# Patient Record
Sex: Female | Born: 1975 | Race: White | Hispanic: No | Marital: Married | Smoking: Current every day smoker
Health system: Southern US, Community
[De-identification: ages and names within clinical notes are randomized; demographics above are authoritative.]

## PROBLEM LIST (undated history)

## (undated) DIAGNOSIS — T8859XA Other complications of anesthesia, initial encounter: Secondary | ICD-10-CM

## (undated) DIAGNOSIS — M797 Fibromyalgia: Secondary | ICD-10-CM

## (undated) DIAGNOSIS — E785 Hyperlipidemia, unspecified: Secondary | ICD-10-CM

## (undated) DIAGNOSIS — I10 Essential (primary) hypertension: Secondary | ICD-10-CM

## (undated) DIAGNOSIS — I350 Nonrheumatic aortic (valve) stenosis: Secondary | ICD-10-CM

## (undated) DIAGNOSIS — F419 Anxiety disorder, unspecified: Secondary | ICD-10-CM

## (undated) HISTORY — DX: Hyperlipidemia, unspecified: E78.5

## (undated) HISTORY — DX: Nonrheumatic aortic (valve) stenosis: I35.0

## (undated) HISTORY — PX: ABDOMINAL HYSTERECTOMY: SHX81

## (undated) HISTORY — DX: Essential (primary) hypertension: I10

## (undated) HISTORY — PX: CHOLECYSTECTOMY: SHX55

---

## 2000-08-05 ENCOUNTER — Emergency Department (HOSPITAL_COMMUNITY): Admission: EM | Admit: 2000-08-05 | Discharge: 2000-08-06 | Payer: Self-pay | Admitting: Emergency Medicine

## 2000-08-08 ENCOUNTER — Emergency Department (HOSPITAL_COMMUNITY): Admission: EM | Admit: 2000-08-08 | Discharge: 2000-08-08 | Payer: Self-pay | Admitting: Emergency Medicine

## 2000-11-05 ENCOUNTER — Other Ambulatory Visit: Admission: RE | Admit: 2000-11-05 | Discharge: 2000-11-05 | Payer: Self-pay | Admitting: Specialist

## 2001-02-23 ENCOUNTER — Other Ambulatory Visit: Admission: RE | Admit: 2001-02-23 | Discharge: 2001-02-23 | Payer: Self-pay | Admitting: Obstetrics and Gynecology

## 2001-06-23 ENCOUNTER — Inpatient Hospital Stay (HOSPITAL_COMMUNITY): Admission: AD | Admit: 2001-06-23 | Discharge: 2001-06-25 | Payer: Self-pay | Admitting: *Deleted

## 2001-10-20 ENCOUNTER — Encounter: Payer: Self-pay | Admitting: Obstetrics & Gynecology

## 2001-10-20 ENCOUNTER — Ambulatory Visit (HOSPITAL_COMMUNITY): Admission: RE | Admit: 2001-10-20 | Discharge: 2001-10-20 | Payer: Self-pay | Admitting: Obstetrics & Gynecology

## 2001-10-23 ENCOUNTER — Ambulatory Visit (HOSPITAL_COMMUNITY): Admission: RE | Admit: 2001-10-23 | Discharge: 2001-10-23 | Payer: Self-pay | Admitting: General Surgery

## 2001-10-23 ENCOUNTER — Encounter: Payer: Self-pay | Admitting: General Surgery

## 2001-12-27 ENCOUNTER — Emergency Department (HOSPITAL_COMMUNITY): Admission: EM | Admit: 2001-12-27 | Discharge: 2001-12-27 | Payer: Self-pay | Admitting: Emergency Medicine

## 2002-11-09 ENCOUNTER — Inpatient Hospital Stay (HOSPITAL_COMMUNITY): Admission: RE | Admit: 2002-11-09 | Discharge: 2002-11-10 | Payer: Self-pay | Admitting: Obstetrics & Gynecology

## 2003-04-10 ENCOUNTER — Emergency Department (HOSPITAL_COMMUNITY): Admission: EM | Admit: 2003-04-10 | Discharge: 2003-04-10 | Payer: Self-pay | Admitting: Emergency Medicine

## 2005-09-06 ENCOUNTER — Ambulatory Visit (HOSPITAL_COMMUNITY): Admission: RE | Admit: 2005-09-06 | Discharge: 2005-09-06 | Payer: Self-pay | Admitting: Internal Medicine

## 2006-01-29 ENCOUNTER — Emergency Department (HOSPITAL_COMMUNITY): Admission: EM | Admit: 2006-01-29 | Discharge: 2006-01-29 | Payer: Self-pay | Admitting: Emergency Medicine

## 2006-05-11 ENCOUNTER — Emergency Department (HOSPITAL_COMMUNITY): Admission: EM | Admit: 2006-05-11 | Discharge: 2006-05-11 | Payer: Self-pay | Admitting: Emergency Medicine

## 2006-12-16 ENCOUNTER — Emergency Department (HOSPITAL_COMMUNITY): Admission: EM | Admit: 2006-12-16 | Discharge: 2006-12-16 | Payer: Self-pay | Admitting: Emergency Medicine

## 2006-12-27 ENCOUNTER — Emergency Department (HOSPITAL_COMMUNITY): Admission: EM | Admit: 2006-12-27 | Discharge: 2006-12-27 | Payer: Self-pay | Admitting: Emergency Medicine

## 2007-02-25 ENCOUNTER — Emergency Department (HOSPITAL_COMMUNITY): Admission: EM | Admit: 2007-02-25 | Discharge: 2007-02-26 | Payer: Self-pay | Admitting: Emergency Medicine

## 2007-08-26 ENCOUNTER — Emergency Department (HOSPITAL_COMMUNITY): Admission: EM | Admit: 2007-08-26 | Discharge: 2007-08-26 | Payer: Self-pay | Admitting: Emergency Medicine

## 2008-02-03 ENCOUNTER — Emergency Department (HOSPITAL_COMMUNITY): Admission: EM | Admit: 2008-02-03 | Discharge: 2008-02-03 | Payer: Self-pay | Admitting: Emergency Medicine

## 2008-04-29 ENCOUNTER — Emergency Department (HOSPITAL_COMMUNITY): Admission: EM | Admit: 2008-04-29 | Discharge: 2008-04-29 | Payer: Self-pay | Admitting: Emergency Medicine

## 2008-09-21 ENCOUNTER — Emergency Department (HOSPITAL_COMMUNITY): Admission: EM | Admit: 2008-09-21 | Discharge: 2008-09-21 | Payer: Self-pay | Admitting: Emergency Medicine

## 2009-11-03 ENCOUNTER — Emergency Department (HOSPITAL_COMMUNITY): Admission: EM | Admit: 2009-11-03 | Discharge: 2009-11-03 | Payer: Self-pay | Admitting: Emergency Medicine

## 2009-12-30 ENCOUNTER — Emergency Department (HOSPITAL_COMMUNITY): Admission: EM | Admit: 2009-12-30 | Discharge: 2009-12-30 | Payer: Self-pay | Admitting: Emergency Medicine

## 2010-01-25 ENCOUNTER — Emergency Department (HOSPITAL_COMMUNITY): Admission: EM | Admit: 2010-01-25 | Discharge: 2009-06-16 | Payer: Self-pay | Admitting: Emergency Medicine

## 2010-03-11 ENCOUNTER — Encounter: Payer: Self-pay | Admitting: Orthopaedic Surgery

## 2010-03-11 ENCOUNTER — Encounter: Payer: Self-pay | Admitting: Urology

## 2010-05-08 LAB — POCT I-STAT, CHEM 8
BUN: 7 mg/dL (ref 6–23)
Chloride: 108 mEq/L (ref 96–112)
Creatinine, Ser: 0.4 mg/dL (ref 0.4–1.2)
Glucose, Bld: 109 mg/dL — ABNORMAL HIGH (ref 70–99)
Hemoglobin: 13.3 g/dL (ref 12.0–15.0)
Potassium: 3.7 mEq/L (ref 3.5–5.1)
Sodium: 141 mEq/L (ref 135–145)
TCO2: 24 mmol/L (ref 0–100)

## 2010-05-08 LAB — POCT CARDIAC MARKERS
CKMB, poc: 2.6 ng/mL (ref 1.0–8.0)
Myoglobin, poc: 75.1 ng/mL (ref 12–200)
Troponin i, poc: <0.05 ng/mL (ref 0.00–0.09)

## 2010-07-06 NOTE — H&P (Signed)
NAME:  Kara Matthews, Kara Matthews                       ACCOUNT NO.:  [id number]   MEDICAL RECORD NO.:  [id number]                   PATIENT TYPE:  AMB   LOCATION:  DAY                                  FACILITY:  [facility]   PHYSICIAN:  Lazaro Arms, M.D.                DATE OF BIRTH:  10-06-1975   DATE OF ADMISSION:  11/09/2002  DATE OF DISCHARGE:                                HISTORY & PHYSICAL   The patient is a 35 year old white female, gravida 2, para 2, status post  tubal ligation with her C-section in 2003. The patient has had continued  left-sided pelvic pain for quite some time.  She has had kidney stones in  the past, and it felt like that recently.  However, her evaluation was  negative for renal calculi.  She also has painful, heavy menses.  She  sometimes soils her clothes and sheets and it requires Tylox for her  periods.  Also, she has pain with intercourse each time she has intercourse.  Sonogram reveals a normal uterus, no cyst.  No free fluid in the cul-de-sac.  We offered oral contraceptive pills, but the patient declined that the  Lorcet was not helping.  As a result with her chronic pelvic pain, left  lower quadrant pain, dysmenorrhea, dyspareunia, and irregular bleeding she  is admitted for abdominal hysterectomy, BSO.  She requests to have both  ovaries removed.   PAST MEDICAL HISTORY:  Negative.   PAST SURGICAL HISTORY:  1. She had a C-section in 1991 and 2003.  2. She had a tubal ligation with the C-section in 1993.  3. She also had a laparoscopic cholecystectomy, shortly after delivery and     that was done in September 2003.   MEDICATIONS:  Her current medications are Lorcet for pain.   ALLERGIES:  She is allergic to CODEINE and ANCEF.   REVIEW OF SYSTEMS:  The patient smokes a pack of cigarettes a day.  She  denies other problems.   PHYSICAL EXAMINATION:  VITAL SIGNS:  Her weight is 168 pounds.  Blood  pressure is 120/80.  HEENT:  Unremarkable.  NECK:   Thyroid is normal.  LUNGS:  Lungs are clear.  HEART:  Heart is regular rate and rhythm without regurgitation or gallop.  BREASTS:  Without masses, discharge, or skin changes.  ABDOMEN:  Benign.  No hepatosplenomegaly or masses.  Well healed scars.  PELVIC: Uterus is normal size, shape, contour.  Tender to palpation.  The  left adnexa is tender, the right adnexa is benign.   IMPRESSION:  1. Chronic pelvic pain.  2. Left lower quadrant pain.  3. Dyspareunia.  4. Menometrorrhagia.  5. Dysmenorrhea.   PLAN:  The patient is admitted for TH/BSO.  She understands the risks,  benefits, indications and alternatives -- will proceed.     ___________________________________________  Lazaro Arms, M.D.   Loraine Maple  D:  11/08/2002  T:  11/08/2002  Job:  [id number]

## 2010-07-06 NOTE — Discharge Summary (Signed)
   NAMEGALADRIEL, SHROFF                       ACCOUNT NO.:  [id number]   MEDICAL RECORD NO.:  [id number]                   PATIENT TYPE:  INP   LOCATION:  A426                                 FACILITY:  [facility]   PHYSICIAN:  Lazaro Arms, M.D.                DATE OF BIRTH:  03-10-1975   DATE OF ADMISSION:  11/09/2002  DATE OF DISCHARGE:  11/10/2002                                 DISCHARGE SUMMARY   DISCHARGE DIAGNOSES:  1. Status post total abdominal hysterectomy, bilateral salpingo-     oophorectomy.  2. Unremarkable postoperative course.   PROCEDURE:  Total abdominal hysterectomy, bilateral salpingo-oophorectomy.   Please refer to the transcribed history and physical and OP note for details  of admission to the hospital.   HOSPITAL COURSE:  The patient was admitted postoperatively.  Her  intraoperative course was unremarkable.  She had a Foley on the afternoon of  surgery.  She voided.  She was ambulatory, tolerated clear liquids and a  regular diet without difficulty and tolerated oral pain medicine.  She was  begging to go home on the morning of postoperative day #1; so she was  discharged to home on Tylox, Motrin, and Levaquin. She has a Climara patch  0.1 mg placed and she has an appointment in the office next Monday.  She was  given instructions and precautions for her term prior to that time.     ___________________________________________                                         Lazaro Arms, M.D.   Loraine Maple  D:  11/10/2002  T:  11/10/2002  Job:  [id number]

## 2010-07-06 NOTE — H&P (Signed)
[facility]  Patient:    Kara Matthews, Kara Matthews Visit Number: [id number] MRN: [id number]          Service Type: OBS Location: 4A A428 01 Attending Physician:  Rosalyn Charters Dictated by:   Chilton Si, C.N.M. Admit Date:  06/23/2001   CC:         [facility]   History and Physical  CONTINUATION  PAST MEDICAL HISTORY:  Noncontributory.  PAST SURGICAL HISTORY:  Positive for cesarean section.  PHYSICAL EXAMINATION:  VITAL SIGNS:  Temperature 99.7 degrees, blood pressure 103/65, pulse 90, respirations 20.  HEENT:  Within normal limits.  HEART:  Regular rate and rhythm without murmurs.  LUNGS:  Clear to auscultation bilaterally.  ABDOMEN:  Soft, nontender.  No uterine activity.  Positive right CVA tenderness.  PELVIC:  Cervix is long, thick, firm and closed with a high presenting part.  EXTREMITIES:  Legs are negative.  LABORATORY DATA:  WBC 12.6, hemoglobin 10.5, hematocrit 30.0.  Urinalysis has specific gravity greater than 1.030, moderate hemoglobin, 2+ protein, positive nitrites, trace leukocyte esterase; too numerous to count wbc/hpf, 11-020 rbc/hpf.  IMPRESSION:  1. Intrauterine pregnancy at 27 weeks.  2. Pyelonephritis.  3. Anemia.  PLAN:  1. Admit for IV antibiotics and pain control.  2. Begin iron therapy. Dictated by:   Chilton Si, C.N.M. Attending Physician:  Rosalyn Charters DD:  06/24/01 TD:  06/24/01 Job: [id number]

## 2010-07-06 NOTE — Op Note (Signed)
Kara Matthews, Kara Matthews                       ACCOUNT NO.:  [id number]   MEDICAL RECORD NO.:  [id number]                   PATIENT TYPE:  AMB   LOCATION:  DAY                                  FACILITY:  [facility]   PHYSICIAN:  Lazaro Arms, M.D.                DATE OF BIRTH:  1975-12-19   DATE OF PROCEDURE:  11/09/2002  DATE OF DISCHARGE:                                 OPERATIVE REPORT   PREOPERATIVE DIAGNOSES:  1. Menometrorrhagia.  2. Chronic pelvic pain.  3. Dysmenorrhea.  4. Dyspareunia.   POSTOPERATIVE DIAGNOSES:  1. Menometrorrhagia.  2. Chronic pelvic pain.  3. Dysmenorrhea.  4. Dyspareunia.   PROCEDURE:  TAHBSO.   SURGEON:  Lazaro Arms, M.D.   ANESTHESIA:  General endotracheal.   FINDINGS:  The patient had normal intraperitoneal cavity.  There was really  no adhesive disease.  The uterus, tubes, and ovaries appeared to be normal,  and there was no evidence of endometriosis.   DESCRIPTION OF PROCEDURE:  The patient was taken to the operating room and  placed in the supine position where she underwent general endotracheal  anesthesia.  A Foley catheter was placed.  The vagina was prepped.  The  abdomen was prepped and draped in the usual sterile fashion.   A Pfannenstiel skin incision was made and carried down sharply through the  rectus fascia which was scored in the midline and extended laterally.  The  fascia was taken off of the muscles superiorly and inferiorly without  difficulty.  The muscles were divided, and the peritoneal cavity was  entered.  A protractor self-retaining retractor was placed, and the upper  abdomen was packed away.  The uterus was grasped from the cornu, and the  infundibulopelvic ligament was isolated and a vascular window made.  It was  double clamped, cut, and double suture ligated on the left.  Large Hemoclips  were also placed for hemostasis.  The same procedure was carried out on the  right.  The infundibulopelvic ligament was  isolated and a vascular window  identified and a window made.  It was double clamped, cut, and double suture  ligated with good hemostasis.  The vesicouterine serosal flap was created,  and the bladder was pushed down.  The uterine vessels were isolated.  They  were clamped, cut, and suture ligated bilaterally.  Serial pedicles were  taken down the cervix through the cardinal ligament, each pedicle being  clamped, cut, and suture ligated.  The vaginal angle was encountered.  The  vagina was crossclamped.  The specimen was removed.  The vaginal angle  sutures were placed in figure-of-eight fashion, and the vagina was closed  with figure-of-eight sutures.  There was good hemostasis of all pedicles.  The pelvis was irrigated vigorously.  Once again, all pedicles were found to  be hemostatic.  The protractor was removed.  The lap packs were removed.  All counts  were correct at this point.  The muscles were reapproximated  loosely.  The fascia was closed using 0 Vicryl running.  The subcutaneous  tissue was made hemostatic and irrigated.  The skin was closed using skin  staples.   The patient tolerated the procedure well.  She experienced 350 cc of blood  loss and was taken to the recovery room in good and stable condition.  All  counts were correct x3.  All specimens were sent to the lab.  She received  Cleocin prophylactically.      ___________________________________________                                            Lazaro Arms, M.D.   Kara Matthews  D:  11/09/2002  T:  11/09/2002  Job:  [id number]

## 2010-07-06 NOTE — H&P (Signed)
   NAME:  Kara Matthews, Kara Matthews                       ACCOUNT NO.:  [id number]   MEDICAL RECORD NO.:  [id number]                   PATIENT TYPE:  OUT   LOCATION:  RAD                                  FACILITY:  [facility]   PHYSICIAN:  Dalia Heading, M.D.               DATE OF BIRTH:  Mar 11, 1975   DATE OF ADMISSION:  10/20/2001  DATE OF DISCHARGE:  10/20/2001                                HISTORY & PHYSICAL   CHIEF COMPLAINT:  Cholecystitis, cholelithiasis.   HISTORY OF PRESENT ILLNESS:  The patient is a 35 year old, white female who  was referred for evaluation and treatment of cholecystitis secondary to  cholelithiasis.  She has been having right upper quadrant abdominal pain,  indigestion, vomiting and nausea intermittently for several weeks and seems  to be getting worse.  She has had no fever, chills or jaundice that have  been noted.   PAST MEDICAL HISTORY:  C-section in July 2003, for an uncomplicated  pregnancy.   PAST SURGICAL HISTORY:  1. C-sections in 1991 and 2003.  2. Tubal ligation.   CURRENT MEDICATIONS:  Percocet.   ALLERGIES:  CODEINE.   REVIEW OF SYMPTOMS:  The patient does smoke a pack of cigarettes per day.  She denies any significant alcohol use.  She denies any bleeding disorder.   PHYSICAL EXAMINATION:  GENERAL:  The patient is a well-developed, well-  nourished, white female in no acute distress.  VITAL SIGNS:  Afebrile with vital signs stable.  HEENT:  No scleral icterus.  LUNGS:  Clear to auscultation with equal breath sounds bilaterally.  HEART:  Regular rate and rhythm without S3, S4 and murmurs.  ABDOMEN:  Soft and nondistended.  She is tender in the right upper quadrant  to palpation.  No hepatosplenomegaly, masses or hernias identified.  Ultrasound of the gallbladder reveals cholelithiasis, cholecystitis and a  common bile duct of 5 mm.   IMPRESSION:  Acute cholecystitis, cholelithiasis.    PLAN:  The patient is scheduled for laparoscopic  cholecystectomy with  cholangiograms on October 23, 2001.  The risks and benefits of the  procedure including bleeding, infection, hepatobiliary injury and possible  open procedure were fully explained to the patient.  We gained informed  consent.                                               Dalia Heading, M.D.    MAJ/MEDQ  D:  10/22/2001  T:  10/22/2001  Job:  [id number]   cc:   Lazaro Arms, M.D.   Angus G. Renard Matter, M.D.

## 2010-07-06 NOTE — Op Note (Signed)
NAME:  Kara Matthews, Kara Matthews                       ACCOUNT NO.:  [id number]   MEDICAL RECORD NO.:  [id number]                   PATIENT TYPE:  AMB   LOCATION:  DAY                                  FACILITY:  [facility]   PHYSICIAN:  Dalia Heading, M.D.               DATE OF BIRTH:  05-28-1975   DATE OF PROCEDURE:  10/23/2001  DATE OF DISCHARGE:                                 OPERATIVE REPORT   PREOPERATIVE DIAGNOSIS:  Cholecystitis, cholelithiasis.   POSTOPERATIVE DIAGNOSIS:  Cholecystitis, cholelithiasis.   PROCEDURE:  Laparoscopic cholecystectomy with cholangiograms.   SURGEON:  Dalia Heading, M.D.   ANESTHESIA:  General endotracheal   INDICATIONS:  The patient is a 35 year old white female who is referred for  evaluation and treatment of cholecystitis secondary to cholelithiasis.  The  risks and benefits of the procedure including bleeding, infection,  hepatobiliary injury, and the possibility of an open procedure were fully  explained to the patient, who gave informed consent.   DESCRIPTION OF PROCEDURE:  The patient was placed in the supine position.  After induction of general endotracheal anesthesia, the abdomen was prepped  and draped using the usual sterile technique with Betadine.   An supraumbilical incision was made down to the fascia.  A Veress needle was  introduced into the abdominal cavity and confirmation of placement was done  using the saline drop test.  The abdomen was then insufflated to 16 mmHg  pressure.  An 11-mm trocar was introduced into the abdominal cavity under  direct visualization without difficulty.  The patient was placed in reverse  Trendelenburg position and an additional 11-mm trocar was placed in the  epigastric region and 5-mm trocars were placed in the right upper quadrant  and right flank regions. The liver was inspected and noted to be within  normal limits.   The gallbladder was retracted superiorly and laterally.  The dissection was  begun around the infundibulum of the gallbladder.  The cystic duct was first  identified.  Its junction to the infundibulum fully identified.  A single  Endoclip was placed proximally on the cystic duct.  An incision was made  into the cystic duct and a cholangiocatheter was inserted.  Under digital  fluoroscopy, cholangiogram were performed.  The dye flowed freely into the  duodenum.  No intrahepatic filling defects were noted.  There was no  evidence of choledocholithiasis.  The common bile duct appeared to be pretty  much within normal limits.  The cholangiocatheter was removed and multiple  Endoclips were placed distally on the cystic and the cystic duce was  divided.  The cystic artery was likewise divided and ligated.  The  gallbladder was freed away from the gallbladder fossa using Bovie  electrocautery.  The gallbladder was delivered through the epigastric trocar  site using an EndoCatch bag.  The gallbladder fossa was inspected and no  abnormal bleeding or bile leakage was noted.  Surgicel was placed in the gallbladder fossa, the subhepatic space, as well  as the right hepatic gutter were evacuated of all fluid and air prior to  removal of the trocars.  All wounds were irrigated with normal saline.  All  wounds were injected with 0.5% Sensorcaine.  The supraumbilical fascia was  reapproximated using an #0 Vicryl interrupted suture. All skin incisions  were closed using staples. Betadine ointment and dry sterile dressings were  applied.  All tape and needle counts correct at the end of the procedure.  The patient was extubated in the operating room and went back to recovery  room in awake and stable condition.   COMPLICATIONS:  None.   SPECIMENS:  Gallbladder with stones.   BLOOD LOSS:  Minimal.                                               Dalia Heading, M.D.    MAJ/MEDQ  D:  10/23/2001  T:  10/23/2001  Job:  [id number]   cc:   Lazaro Arms, M.D.   Angus G. Renard Matter,  M.D.

## 2010-07-06 NOTE — Discharge Summary (Signed)
[facility]  Patient:    Kara Matthews, Kara Matthews Visit Number: [id number] MRN: [id number]          Service Type: OBS Location: 4A A428 01 Attending Physician:  Lazaro Arms Dictated by:   Christin Bach, M.D. Admit Date:  06/23/2001 Discharge Date: 06/25/2001                             Discharge Summary  ADMITTING DIAGNOSES: 1. Intrauterine pregnancy at 27 weeks. 2. Pyelonephritis. 3. Anemia of pregnancy.  DISCHARGE DIAGNOSES: 1. Pregnancy at 27 weeks not delivered. 2. Pyelonephritis versus musculoskeletal back pain. 3. Anemia of pregnancy.  DISCHARGE MEDICATIONS: 1. Keflex 500 mg p.o. q.i.d. x7 days. 2. Prenatal vitamins one p.o. q.d. 3. Iron sulfate 325 mg one p.o. b.i.d. x30 days and p.r.n. thereafter.  LABORATORY DATA:  Urine culture 3000 colonies per cc, considered insignificant growth.  HISTORY OF PRESENT ILLNESS:  This 35 year old female at [redacted] weeks gestation, gravida 2, para 1, due September 22, 2001 was admitted after presenting with chief complaints of back pain, lower abdominal pain without ruptured membranes, with evaluation showing the patient to have a low-grade temperature of 99.7, pulse 97, respirations 20, blood pressure 116/73, with a urinalysis showing large amount of blood, 2+ protein, and 1+ leukocytes in a clean-voided specimen. Cervical exam showed the cervix to be closed, long and thick. The patient was admitted for presumptive kidney infection. See admitting history which is pertinent for right CVA tenderness and long, closed, thick cervix with no uterine irritability on monitoring.  HOSPITAL COURSE:  The patient was admitted and received IV Rocephin 1 g q.12 h. with evaluation showing that the CVA tenderness had resolved by the morning of Jun 25, 2001. She was having no preterm labor. The urine culture was pending at the time of discharge, but final report was as previously mentioned minimal culture of 3000 colonies not  otherwise specified.  DISPOSITION:  She was discharged home on Keflex as a precaution.  FOLLOWUP:  Followup will be in one week our office, then as needed. Dictated by:   Christin Bach, M.D. Attending Physician:  Lazaro Arms DD:  07/26/01 TD:  07/28/01 Job: [id number]

## 2010-07-06 NOTE — H&P (Signed)
[facility]  Patient:    Kara Matthews, Kara Matthews Visit Number: [id number] MRN: [id number]          Service Type: OBS Location: 4A A428 01 Attending Physician:  Rosalyn Charters Dictated by:   Chilton Si, C.N.M. Admit Date:  06/23/2001                           History and Physical  INCOMPLETE  CHIEF COMPLAINT:  Lower back pain and abdominal pain for two days.  HISTORY OF PRESENT ILLNESS:  Kara Matthews is a 35 year old white female, who is [redacted] weeks pregnant, planning a repeat cesarean section, with complaints of lower back pain and abdominal cramps for several days.  She denies dysuria but complains of frequency.  PAST MEDICAL HISTORY: Dictated by:   Chilton Si, C.N.M. Attending Physician:  Rosalyn Charters DD:  06/24/01 TD:  06/24/01 Job: [id number]

## 2010-11-15 LAB — DIFFERENTIAL
Basophils Absolute: 0
Eosinophils Absolute: 0.2
Lymphocytes Relative: 17
Lymphs Abs: 1.8
Monocytes Absolute: 0.4
Monocytes Relative: 4

## 2010-11-15 LAB — URINE CULTURE

## 2010-11-15 LAB — URINALYSIS, ROUTINE W REFLEX MICROSCOPIC: Specific Gravity, Urine: 1.02

## 2010-11-15 LAB — BASIC METABOLIC PANEL
BUN: 6
CO2: 27
Calcium: 9.3
Chloride: 106
GFR calc Af Amer: >60
Glucose, Bld: 136 — ABNORMAL HIGH
Potassium: 3.4 — ABNORMAL LOW
Sodium: 140

## 2010-11-15 LAB — CBC
HCT: 37.3
MCV: 89.7
RBC: 4.16

## 2010-11-15 LAB — URINE MICROSCOPIC-ADD ON

## 2010-11-27 LAB — RAPID STREP SCREEN (MED CTR MEBANE ONLY): Streptococcus, Group A Screen (Direct): POSITIVE — AB

## 2011-04-04 ENCOUNTER — Other Ambulatory Visit: Payer: Self-pay | Admitting: Obstetrics & Gynecology

## 2011-10-24 ENCOUNTER — Emergency Department (HOSPITAL_COMMUNITY): Payer: Medicaid Other

## 2011-10-24 ENCOUNTER — Encounter (HOSPITAL_COMMUNITY): Payer: Self-pay | Admitting: *Deleted

## 2011-10-24 ENCOUNTER — Emergency Department (HOSPITAL_COMMUNITY)
Admission: EM | Admit: 2011-10-24 | Discharge: 2011-10-24 | Disposition: A | Discharge status: Home / Self Care | Payer: Medicaid Other | Attending: Emergency Medicine | Admitting: Emergency Medicine

## 2011-10-24 DIAGNOSIS — IMO0001 Reserved for inherently not codable concepts without codable children: Secondary | ICD-10-CM | POA: Insufficient documentation

## 2011-10-24 DIAGNOSIS — S838X9A Sprain of other specified parts of unspecified knee, initial encounter: Secondary | ICD-10-CM | POA: Insufficient documentation

## 2011-10-24 DIAGNOSIS — Y9289 Other specified places as the place of occurrence of the external cause: Secondary | ICD-10-CM | POA: Insufficient documentation

## 2011-10-24 DIAGNOSIS — F17200 Nicotine dependence, unspecified, uncomplicated: Secondary | ICD-10-CM | POA: Insufficient documentation

## 2011-10-24 DIAGNOSIS — F411 Generalized anxiety disorder: Secondary | ICD-10-CM | POA: Insufficient documentation

## 2011-10-24 DIAGNOSIS — S86919A Strain of unspecified muscle(s) and tendon(s) at lower leg level, unspecified leg, initial encounter: Secondary | ICD-10-CM

## 2011-10-24 DIAGNOSIS — Z9089 Acquired absence of other organs: Secondary | ICD-10-CM | POA: Insufficient documentation

## 2011-10-24 DIAGNOSIS — Z79899 Other long term (current) drug therapy: Secondary | ICD-10-CM | POA: Insufficient documentation

## 2011-10-24 DIAGNOSIS — X500XXA Overexertion from strenuous movement or load, initial encounter: Secondary | ICD-10-CM | POA: Insufficient documentation

## 2011-10-24 DIAGNOSIS — I10 Essential (primary) hypertension: Secondary | ICD-10-CM | POA: Insufficient documentation

## 2011-10-24 HISTORY — DX: Anxiety disorder, unspecified: F41.9

## 2011-10-24 HISTORY — DX: Fibromyalgia: M79.7

## 2011-10-24 MED ORDER — IBUPROFEN 600 MG PO TABS: 600.0000 mg | ORAL_TABLET | Freq: Four times a day (QID) | ORAL | Status: AC | PRN

## 2011-10-24 MED ORDER — HYDROCODONE-ACETAMINOPHEN 5-325 MG PO TABS: 1.0000 | ORAL_TABLET | ORAL | Status: AC | PRN

## 2011-10-24 MED ORDER — IBUPROFEN 800 MG PO TABS
800.0000 mg | ORAL_TABLET | Freq: Once | ORAL | Status: AC
  Administered 2011-10-24: 800 mg via ORAL
  Filled 2011-10-24: qty 1

## 2011-10-24 MED ORDER — HYDROCODONE-ACETAMINOPHEN 5-325 MG PO TABS: 1.0000 | ORAL_TABLET | Freq: Once | ORAL | Status: DC

## 2011-10-24 NOTE — ED Notes (Signed)
Left knee pain since yesterday. Denies injury.

## 2011-10-25 NOTE — ED Provider Notes (Signed)
History     CSN: [id number]  Arrival date & time 10/24/11  1208   First MD Initiated Contact with Patient 10/24/11 1234      Chief Complaint  Patient presents with  . Knee Pain    (Consider location/radiation/quality/duration/timing/severity/associated sxs/prior treatment) HPI Comments: Kara Matthews presents with left knee pain which started yesterday.  She reports stepping out of her truck,  Pivoted on her left foot to close the door,  And had sudden sharp nonradiating pain at the anterior lower knee space.  She has used ice and tried to minimize weight bearing for the rest of yesterday,  But today her pain is no better.  Weight bearing, palpation and bending makes the pain worse, it is better at rest.  The history is provided by the patient.    Past Medical History  Diagnosis Date  . Fibromyalgia   . Anxiety   . Hypertension     Past Surgical History  Procedure Date  . Cesarean section   . Cholecystectomy   . Abdominal hysterectomy     No family history on file.  History  Substance Use Topics  . Smoking status: Current Everyday Smoker  . Smokeless tobacco: Not on file  . Alcohol Use: No    OB History    Grav Para Term Preterm Abortions TAB SAB Ect Mult Living                  Review of Systems  Musculoskeletal: Positive for arthralgias and gait problem. Negative for joint swelling.  Skin: Negative for wound.  Neurological: Negative for weakness and numbness.    Allergies  Codeine  Home Medications   Current Outpatient Rx  Name Route Sig Dispense Refill  . ALPRAZOLAM 1 MG PO TABS Oral Take 1 mg by mouth 3 (three) times daily as needed. Anxiety.    . AMPHETAMINE-DEXTROAMPHET ER 30 MG PO CP24 Oral Take 30 mg by mouth every morning.    Marland Kitchen AMPHETAMINE-DEXTROAMPHETAMINE 30 MG PO TABS Oral Take 30 mg by mouth daily.    Marland Kitchen BISOPROLOL-HYDROCHLOROTHIAZIDE 5-6.25 MG PO TABS Oral Take 1 tablet by mouth daily.    Marland Kitchen ESTROGENS CONJUGATED 1.25 MG PO TABS Oral  Take 1.25 mg by mouth daily.    Marland Kitchen GABAPENTIN 800 MG PO TABS Oral Take 800 mg by mouth at bedtime.    Marland Kitchen HYDROCODONE-ACETAMINOPHEN 7.5-500 MG PO TABS Oral Take 1 tablet by mouth 2 (two) times daily as needed. Severe back pain.    Valetta Fuller HOT EX Apply externally Apply 1 application topically 2 (two) times daily as needed. Back pain.    Marland Kitchen PHENYLEPHRINE HCL 10 MG PO TABS Oral Take 10 mg by mouth every 4 (four) hours as needed. Sinus headache.    Marland Kitchen HYDROCODONE-ACETAMINOPHEN 5-325 MG PO TABS Oral Take 1 tablet by mouth every 4 (four) hours as needed for pain. 15 tablet 0  . IBUPROFEN 600 MG PO TABS Oral Take 1 tablet (600 mg total) by mouth every 6 (six) hours as needed for pain. 20 tablet 0    BP 124/83  Pulse 66  Temp 98.3 F (36.8 C) (Oral)  Resp 18  Ht 4\' 11"  (1.499 m)  Wt 170 lb (77.111 kg)  BMI 34.34 kg/m2  SpO2 99%  Physical Exam  Constitutional: She appears well-developed and well-nourished.  HENT:  Head: Atraumatic.  Neck: Normal range of motion.  Cardiovascular:       Pulses equal bilaterally  Musculoskeletal: She exhibits tenderness. She exhibits  no edema.       Left knee: She exhibits normal range of motion, no swelling and no erythema. tenderness found. Lateral joint line tenderness noted. No medial joint line, no MCL and no LCL tenderness noted.       Crepitus with ROM.  Neurological: She is alert. She has normal strength. She displays normal reflexes. No sensory deficit.       Equal strength  Skin: Skin is warm and dry.  Psychiatric: She has a normal mood and affect.    ED Course  Procedures (including critical care time)  Labs Reviewed - No data to display Dg Knee Complete 4 Views Left  10/24/2011  *RADIOLOGY REPORT*  Clinical Data: Neck pain.  Unable to bear weight.  LEFT KNEE - COMPLETE 4+ VIEW  Comparison: None.  Findings: The left knee is located.  No acute bone or soft tissue abnormalities present.  There is no significant effusion. Mineralization is within normal  limits.  IMPRESSION: Negative left knee.   Original Report Authenticated By: Jamesetta Orleans. MATTERN, M.D.      1. Knee strain     Ace wrap,  Crutches supplied  MDM  xrays reviewed,  Suspect simple knee strain.  Pt advised ice,  Elevate,  Recheck by ortho if not improving over the next week.  If not improved,  May need further imaging to rule out meniscal injury.        Burgess Amor, [place] 10/25/11 [phone]

## 2011-10-25 NOTE — ED Provider Notes (Signed)
Medical screening examination/treatment/procedure(s) were performed by non-physician practitioner and as supervising physician I was immediately available for consultation/collaboration.   Carleene Cooper III, MD 10/25/11 [phone]

## 2012-08-05 ENCOUNTER — Other Ambulatory Visit: Payer: Self-pay | Admitting: *Deleted

## 2012-08-06 MED ORDER — ESTROGENS CONJUGATED 1.25 MG PO TABS: 1.2500 mg | ORAL_TABLET | Freq: Every day | ORAL | Status: DC

## 2013-03-14 ENCOUNTER — Encounter (HOSPITAL_COMMUNITY): Payer: Self-pay | Admitting: Emergency Medicine

## 2013-03-14 ENCOUNTER — Emergency Department (HOSPITAL_COMMUNITY)
Admission: EM | Admit: 2013-03-14 | Discharge: 2013-03-14 | Disposition: A | Discharge status: Home / Self Care | Payer: Medicaid Other | Attending: Emergency Medicine | Admitting: Emergency Medicine

## 2013-03-14 DIAGNOSIS — F411 Generalized anxiety disorder: Secondary | ICD-10-CM | POA: Insufficient documentation

## 2013-03-14 DIAGNOSIS — F17200 Nicotine dependence, unspecified, uncomplicated: Secondary | ICD-10-CM | POA: Insufficient documentation

## 2013-03-14 DIAGNOSIS — Z79899 Other long term (current) drug therapy: Secondary | ICD-10-CM | POA: Insufficient documentation

## 2013-03-14 DIAGNOSIS — T733XXA Exhaustion due to excessive exertion, initial encounter: Secondary | ICD-10-CM | POA: Insufficient documentation

## 2013-03-14 DIAGNOSIS — Y93E9 Activity, other interior property and clothing maintenance: Secondary | ICD-10-CM | POA: Insufficient documentation

## 2013-03-14 DIAGNOSIS — S39012A Strain of muscle, fascia and tendon of lower back, initial encounter: Secondary | ICD-10-CM

## 2013-03-14 DIAGNOSIS — Y92009 Unspecified place in unspecified non-institutional (private) residence as the place of occurrence of the external cause: Secondary | ICD-10-CM | POA: Insufficient documentation

## 2013-03-14 DIAGNOSIS — I10 Essential (primary) hypertension: Secondary | ICD-10-CM | POA: Insufficient documentation

## 2013-03-14 DIAGNOSIS — S335XXA Sprain of ligaments of lumbar spine, initial encounter: Secondary | ICD-10-CM | POA: Insufficient documentation

## 2013-03-14 MED ORDER — METHOCARBAMOL 500 MG PO TABS: 500.0000 mg | ORAL_TABLET | Freq: Two times a day (BID) | ORAL | Status: DC

## 2013-03-14 MED ORDER — NAPROXEN 500 MG PO TABS
500.0000 mg | ORAL_TABLET | Freq: Two times a day (BID) | ORAL | Status: DC
Start: 2013-03-14 — End: 2014-10-19

## 2013-03-14 MED ORDER — KETOROLAC TROMETHAMINE 30 MG/ML IJ SOLN
30.0000 mg | Freq: Once | INTRAMUSCULAR | Status: AC
  Administered 2013-03-14: 30 mg via INTRAMUSCULAR
  Filled 2013-03-14: qty 1

## 2013-03-14 MED ORDER — HYDROCODONE-ACETAMINOPHEN 5-325 MG PO TABS: 1.0000 | ORAL_TABLET | Freq: Four times a day (QID) | ORAL | Status: DC | PRN

## 2013-03-14 NOTE — Discharge Instructions (Signed)
Back Pain, Adult Low back pain is very common. About 1 in 5 people have back pain.The cause of low back pain is rarely dangerous. The pain often gets better over time.About half of people with a sudden onset of back pain feel better in just 2 weeks. About 8 in 10 people feel better by 6 weeks.  CAUSES Some common causes of back pain include:  Strain of the muscles or ligaments supporting the spine.  Wear and tear (degeneration) of the spinal discs.  Arthritis.  Direct injury to the back. DIAGNOSIS Most of the time, the direct cause of low back pain is not known.However, back pain can be treated effectively even when the exact cause of the pain is unknown.Answering your caregiver's questions about your overall health and symptoms is one of the most accurate ways to make sure the cause of your pain is not dangerous. If your caregiver needs more information, he or she may order lab work or imaging tests (X-rays or MRIs).However, even if imaging tests show changes in your back, this usually does not require surgery. HOME CARE INSTRUCTIONS For many people, back pain returns.Since low back pain is rarely dangerous, it is often a condition that people can learn to manageon their own.   Remain active. It is stressful on the back to sit or stand in one place. Do not sit, drive, or stand in one place for more than 30 minutes at a time. Take short walks on level surfaces as soon as pain allows.Try to increase the length of time you walk each day.  Do not stay in bed.Resting more than 1 or 2 days can delay your recovery.  Do not avoid exercise or work.Your body is made to move.It is not dangerous to be active, even though your back may hurt.Your back will likely heal faster if you return to being active before your pain is gone.  Pay attention to your body when you bend and lift. Many people have less discomfortwhen lifting if they bend their knees, keep the load close to their bodies,and  avoid twisting. Often, the most comfortable positions are those that put less stress on your recovering back.  Find a comfortable position to sleep. Use a firm mattress and lie on your side with your knees slightly bent. If you lie on your back, put a pillow under your knees.  Only take over-the-counter or prescription medicines as directed by your caregiver. Over-the-counter medicines to reduce pain and inflammation are often the most helpful.Your caregiver may prescribe muscle relaxant drugs.These medicines help dull your pain so you can more quickly return to your normal activities and healthy exercise.  Put ice on the injured area.  Put ice in a plastic bag.  Place a towel between your skin and the bag.  Leave the ice on for 15-20 minutes, 03-04 times a day for the first 2 to 3 days. After that, ice and heat may be alternated to reduce pain and spasms.  Ask your caregiver about trying back exercises and gentle massage. This may be of some benefit.  Avoid feeling anxious or stressed.Stress increases muscle tension and can worsen back pain.It is important to recognize when you are anxious or stressed and learn ways to manage it.Exercise is a great option. SEEK MEDICAL CARE IF:  You have pain that is not relieved with rest or medicine.  You have pain that does not improve in 1 week.  You have new symptoms.  You are generally not feeling well. SEEK   IMMEDIATE MEDICAL CARE IF:   You have pain that radiates from your back into your legs.  You develop new bowel or bladder control problems.  You have unusual weakness or numbness in your arms or legs.  You develop nausea or vomiting.  You develop abdominal pain.  You feel faint. Document Released: 02/04/2005 Document Revised: 08/06/2011 Document Reviewed: 06/25/2010 [employer] Patient Information 2014 [employer].  

## 2013-03-14 NOTE — ED Notes (Signed)
Pt states that she was moving furniture yesterday, woke up this am with left side lower back pain. Denies any injury.

## 2013-03-14 NOTE — ED Provider Notes (Signed)
CSN: [id number]     Arrival date & time 03/14/13  1558 History   First MD Initiated Contact with Patient 03/14/13 1636     Chief Complaint  Patient presents with  . Back Pain   (Consider location/radiation/quality/duration/timing/severity/associated sxs/prior Treatment) Patient is a 38 y.o. female presenting with back pain. The history is provided by the patient. No language interpreter was used.  Back Pain Location:  Lumbar spine Quality:  Shooting and aching Radiates to: left hip. Pain severity:  Moderate Pain is:  Same all the time Onset quality:  Gradual Duration:  1 day Timing:  Constant Progression:  Worsening Chronicity:  New Context: physical stress   Relieved by:  None tried Worsened by:  Touching, sitting and movement Associated symptoms: no bladder incontinence, no bowel incontinence, no perianal numbness and no weakness     Past Medical History  Diagnosis Date  . Fibromyalgia   . Anxiety   . Hypertension    Past Surgical History  Procedure Laterality Date  . Cesarean section    . Cholecystectomy    . Abdominal hysterectomy     No family history on file. History  Substance Use Topics  . Smoking status: Current Every Day Smoker  . Smokeless tobacco: Not on file  . Alcohol Use: No   OB History   Grav Para Term Preterm Abortions TAB SAB Ect Mult Living                 Review of Systems  Gastrointestinal: Negative for bowel incontinence.  Genitourinary: Negative for bladder incontinence.  Musculoskeletal: Positive for back pain.  Neurological: Negative for weakness.  All other systems reviewed and are negative.    Allergies  Codeine  Home Medications   Current Outpatient Rx  Name  Route  Sig  Dispense  Refill  . ALPRAZolam (XANAX) 1 MG tablet   Oral   Take 1 mg by mouth 3 (three) times daily as needed. Anxiety.         Marland Kitchen amphetamine-dextroamphetamine (ADDERALL XR) 30 MG 24 hr capsule   Oral   Take 30 mg by mouth every morning.         Marland Kitchen amphetamine-dextroamphetamine (ADDERALL) 30 MG tablet   Oral   Take 30 mg by mouth daily.         . bisoprolol-hydrochlorothiazide (ZIAC) 5-6.25 MG per tablet   Oral   Take 1 tablet by mouth daily.         Marland Kitchen estrogens, conjugated, (PREMARIN) 1.25 MG tablet   Oral   Take 1.25 mg by mouth daily.         Marland Kitchen estrogens, conjugated, (PREMARIN) 1.25 MG tablet   Oral   Take 1 tablet (1.25 mg total) by mouth daily.   30 tablet   11   . gabapentin (NEURONTIN) 800 MG tablet   Oral   Take 800 mg by mouth at bedtime.         Marland Kitchen HYDROcodone-acetaminophen (LORTAB) 7.5-500 MG per tablet   Oral   Take 1 tablet by mouth 2 (two) times daily as needed. Severe back pain.         . Menthol, Topical Analgesic, (ICY HOT EX)   Apply externally   Apply 1 application topically 2 (two) times daily as needed. Back pain.         . phenylephrine (SUDAFED PE) 10 MG TABS   Oral   Take 10 mg by mouth every 4 (four) hours as needed. Sinus headache.  BP 122/87  Pulse 79  Temp(Src) 98.1 F (36.7 C) (Oral)  Resp 20  SpO2 99% Physical Exam  Nursing note and vitals reviewed. Constitutional: She is oriented to person, place, and time. She appears well-developed and well-nourished.  HENT:  Head: Normocephalic.  Eyes: Conjunctivae are normal. Pupils are equal, round, and reactive to light.  Neck: Normal range of motion.  Cardiovascular: Normal rate and regular rhythm.   Pulmonary/Chest: Effort normal and breath sounds normal.  Abdominal: Soft.  Musculoskeletal: Normal range of motion.  Neurological: She is alert and oriented to person, place, and time.  Skin: Skin is warm and dry.  Psychiatric: She has a normal mood and affect. Her behavior is normal. Judgment and thought content normal.    ED Course  Procedures (including critical care time) Labs Review Labs Reviewed - No data to display Imaging Review No results found.  EKG Interpretation   None      Low back pain  developed after rearranging room at home with movement of heavy furniture.  Mild pain last night, worse upon awakening this morning.  No red flag symptoms.  NSAID, muscle relaxant. MDM  Low back pain.    Norman Herrlich, NP 03/14/13 [phone]

## 2013-03-14 NOTE — ED Provider Notes (Signed)
Medical screening examination/treatment/procedure(s) were performed by non-physician practitioner and as supervising physician I was immediately available for consultation/collaboration.  EKG Interpretation   None         Maudry Diego, MD 03/14/13 2248

## 2013-05-06 ENCOUNTER — Encounter (HOSPITAL_COMMUNITY): Payer: Self-pay | Admitting: Emergency Medicine

## 2013-05-06 ENCOUNTER — Emergency Department (HOSPITAL_COMMUNITY)
Admission: EM | Admit: 2013-05-06 | Discharge: 2013-05-06 | Disposition: A | Discharge status: Home / Self Care | Payer: Medicaid Other | Attending: Emergency Medicine | Admitting: Emergency Medicine

## 2013-05-06 DIAGNOSIS — J069 Acute upper respiratory infection, unspecified: Secondary | ICD-10-CM | POA: Insufficient documentation

## 2013-05-06 DIAGNOSIS — I10 Essential (primary) hypertension: Secondary | ICD-10-CM | POA: Insufficient documentation

## 2013-05-06 DIAGNOSIS — Z791 Long term (current) use of non-steroidal anti-inflammatories (NSAID): Secondary | ICD-10-CM | POA: Insufficient documentation

## 2013-05-06 DIAGNOSIS — Z79899 Other long term (current) drug therapy: Secondary | ICD-10-CM | POA: Insufficient documentation

## 2013-05-06 DIAGNOSIS — F411 Generalized anxiety disorder: Secondary | ICD-10-CM | POA: Insufficient documentation

## 2013-05-06 DIAGNOSIS — Z8739 Personal history of other diseases of the musculoskeletal system and connective tissue: Secondary | ICD-10-CM | POA: Insufficient documentation

## 2013-05-06 DIAGNOSIS — F17200 Nicotine dependence, unspecified, uncomplicated: Secondary | ICD-10-CM | POA: Insufficient documentation

## 2013-05-06 DIAGNOSIS — J329 Chronic sinusitis, unspecified: Secondary | ICD-10-CM

## 2013-05-06 MED ORDER — KETOROLAC TROMETHAMINE 60 MG/2ML IM SOLN
60.0000 mg | Freq: Once | INTRAMUSCULAR | Status: AC
  Administered 2013-05-06: 60 mg via INTRAMUSCULAR
  Filled 2013-05-06: qty 2

## 2013-05-06 MED ORDER — PREDNISONE 10 MG PO TABS: ORAL_TABLET | ORAL | Status: DC

## 2013-05-06 MED ORDER — HYDROCODONE-ACETAMINOPHEN 5-325 MG PO TABS: ORAL_TABLET | ORAL | Status: DC

## 2013-05-06 NOTE — ED Notes (Signed)
Headache for 3 weeks. Neck sore. Has been using  Afrin,  vicks vapo rub. And  Sudafed without relief.

## 2013-05-06 NOTE — Discharge Instructions (Signed)
Sinus Headache °A sinus headache happens when your sinuses become clogged or puffy (swollen). Sinus headaches can be mild or severe. °HOME CARE °· Take your medicines (antibiotics) as told. Finish them even if you start to feel better. °· Only take medicine as told by your doctor. °· Use a nose spray if you feel stuffed up (congested). °GET HELP RIGHT AWAY IF: °· You have a fever. °· You have trouble seeing. °· You suddenly have pain in your face or head. °· You start to twitch or shake (seizure). °· You are confused. °· You get headaches more than once a week. °· Light or sound bothers you. °· You feel sick to your stomach (nauseous) or throw up (vomit). °· Your headaches do not get better with treatment. °MAKE SURE YOU: °· Understand these instructions. °· Will watch your condition. °· Will get help right away if you are not doing well or get worse. °Document Released: 06/06/2010 Document Revised: 04/29/2011 Document Reviewed: 06/06/2010 °[employer]® Patient Information ©2014 [employer]. ° °

## 2013-05-09 NOTE — ED Provider Notes (Signed)
CSN: [id number]     Arrival date & time 05/06/13  1944 History   First MD Initiated Contact with Patient 05/06/13 2016     Chief Complaint  Patient presents with  . Headache     (Consider location/radiation/quality/duration/timing/severity/associated sxs/prior Treatment) Patient is a 38 y.o. female presenting with headaches. The history is provided by the patient.  Headache Pain location:  R temporal, L temporal and frontal Quality:  Dull Radiates to:  Face Onset quality:  Gradual Duration:  3 weeks Timing:  Constant Progression:  Unchanged Chronicity:  Recurrent Similar to prior headaches: yes   Context comment:  Nasal congestion, pressure Relieved by:  Nothing Worsened by:  Nothing tried Ineffective treatments: OTC medications. Associated symptoms: congestion, facial pain, sinus pressure, swollen glands and URI   Associated symptoms: no abdominal pain, no back pain, no cough, no dizziness, no drainage, no pain, no fever, no focal weakness, no hearing loss, no loss of balance, no myalgias, no nausea, no near-syncope, no neck pain, no neck stiffness, no numbness, no paresthesias, no photophobia, no seizures, no sore throat, no syncope, no visual change and no vomiting   Congestion:    Location:  Nasal   Interferes with sleep: no     Interferes with eating/drinking: yes     Past Medical History  Diagnosis Date  . Fibromyalgia   . Anxiety   . Hypertension    Past Surgical History  Procedure Laterality Date  . Cesarean section    . Cholecystectomy    . Abdominal hysterectomy     History reviewed. No pertinent family history. History  Substance Use Topics  . Smoking status: Current Every Day Smoker  . Smokeless tobacco: Not on file  . Alcohol Use: No   OB History   Grav Para Term Preterm Abortions TAB SAB Ect Mult Living                 Review of Systems  Constitutional: Negative for fever, activity change and appetite change.  HENT: Positive for congestion and  sinus pressure. Negative for facial swelling, hearing loss, postnasal drip, sore throat and trouble swallowing.   Eyes: Negative for photophobia, pain and visual disturbance.  Respiratory: Negative for cough, chest tightness and shortness of breath.   Cardiovascular: Negative for chest pain, syncope and near-syncope.  Gastrointestinal: Negative for nausea, vomiting and abdominal pain.  Musculoskeletal: Negative for back pain, myalgias, neck pain and neck stiffness.  Skin: Negative for rash and wound.  Neurological: Positive for headaches. Negative for dizziness, focal weakness, seizures, facial asymmetry, speech difficulty, weakness, numbness, paresthesias and loss of balance.  Psychiatric/Behavioral: Negative for confusion and decreased concentration.  All other systems reviewed and are negative.      Allergies  Bactrim and Codeine  Home Medications   Current Outpatient Rx  Name  Route  Sig  Dispense  Refill  . ALPRAZolam (XANAX) 1 MG tablet   Oral   Take 1 mg by mouth 3 (three) times daily as needed. Anxiety.         Marland Kitchen amphetamine-dextroamphetamine (ADDERALL XR) 30 MG 24 hr capsule   Oral   Take 30 mg by mouth every morning.         Marland Kitchen amphetamine-dextroamphetamine (ADDERALL) 30 MG tablet   Oral   Take 30 mg by mouth daily.         . bisoprolol-hydrochlorothiazide (ZIAC) 5-6.25 MG per tablet   Oral   Take 1 tablet by mouth daily.         Marland Kitchen  estrogens, conjugated, (PREMARIN) 1.25 MG tablet   Oral   Take 1.25 mg by mouth daily.         Marland Kitchen estrogens, conjugated, (PREMARIN) 1.25 MG tablet   Oral   Take 1 tablet (1.25 mg total) by mouth daily.   30 tablet   11   . gabapentin (NEURONTIN) 800 MG tablet   Oral   Take 800 mg by mouth at bedtime.         Marland Kitchen HYDROcodone-acetaminophen (LORTAB) 7.5-500 MG per tablet   Oral   Take 1 tablet by mouth 2 (two) times daily as needed. Severe back pain.         Marland Kitchen HYDROcodone-acetaminophen (NORCO/VICODIN) 5-325 MG per  tablet   Oral   Take 1 tablet by mouth every 6 (six) hours as needed for severe pain.   10 tablet   0   . HYDROcodone-acetaminophen (NORCO/VICODIN) 5-325 MG per tablet      Take one-two tabs po q 4-6 hrs prn pain   10 tablet   0   . Menthol, Topical Analgesic, (ICY HOT EX)   Apply externally   Apply 1 application topically 2 (two) times daily as needed. Back pain.         . methocarbamol (ROBAXIN) 500 MG tablet   Oral   Take 1 tablet (500 mg total) by mouth 2 (two) times daily.   20 tablet   0   . naproxen (NAPROSYN) 500 MG tablet   Oral   Take 1 tablet (500 mg total) by mouth 2 (two) times daily.   30 tablet   0   . phenylephrine (SUDAFED PE) 10 MG TABS   Oral   Take 10 mg by mouth every 4 (four) hours as needed. Sinus headache.         . predniSONE (DELTASONE) 10 MG tablet      Take 6 tablets day one, 5 tablets day two, 4 tablets day three, 3 tablets day four, 2 tablets day five, then 1 tablet day six   21 tablet   0    BP 140/92  Pulse 80  Temp(Src) 98.1 F (36.7 C) (Oral)  Resp 20  Ht 4\' 11"  (1.499 m)  Wt 171 lb (77.565 kg)  BMI 34.52 kg/m2  SpO2 97% Physical Exam  Nursing note and vitals reviewed. Constitutional: She is oriented to person, place, and time. She appears well-developed and well-nourished. No distress.  HENT:  Head: Normocephalic and atraumatic.  Right Ear: Tympanic membrane and ear canal normal.  Left Ear: Tympanic membrane and ear canal normal.  Nose: Mucosal edema present. Right sinus exhibits maxillary sinus tenderness and frontal sinus tenderness. Left sinus exhibits maxillary sinus tenderness and frontal sinus tenderness.  Mouth/Throat: Uvula is midline, oropharynx is clear and moist and mucous membranes are normal.  Eyes: EOM are normal. Pupils are equal, round, and reactive to light.  Neck: Normal range of motion and phonation normal. Neck supple. No spinous process tenderness and no muscular tenderness present. No rigidity. No  Brudzinski's sign and no Kernig's sign noted.  Cardiovascular: Normal rate, regular rhythm, normal heart sounds and intact distal pulses.   No murmur heard. Pulmonary/Chest: Effort normal and breath sounds normal. No respiratory distress.  Musculoskeletal: Normal range of motion.  Neurological: She is alert and oriented to person, place, and time. She has normal strength. No cranial nerve deficit or sensory deficit. She exhibits normal muscle tone. Coordination and gait normal. GCS eye subscore is 4. GCS verbal subscore is  5. GCS motor subscore is 6.  Reflex Scores:      Tricep reflexes are 2+ on the right side and 2+ on the left side.      Bicep reflexes are 2+ on the right side and 2+ on the left side. Skin: Skin is warm and dry.  Psychiatric: She has a normal mood and affect.    ED Course  Procedures (including critical care time) Labs Review Labs Reviewed - No data to display Imaging Review No results found.   EKG Interpretation None      MDM   Final diagnoses:  Sinusitis    Patient with hx of recurrent sinusitis.  She is well appearing.  VSS.  No focal neurological deficits, no nuchal rigidity.  Pain likely related to sinusitis.  No concerning sx's for meningitis or SAH.  Pt agrees to continue sudafed , fluids, vicodin # 10 and prednisone taper.  Advised pt to d/c afrin.   The patient appears reasonably screened and/or stabilized for discharge and I doubt any other medical condition or other [facility] requiring further screening, evaluation, or treatment in the ED at this time prior to discharge.     Shala Baumbach L. Vanessa [place], PA-C 05/09/13 1735

## 2013-05-10 NOTE — ED Provider Notes (Signed)
Medical screening examination/treatment/procedure(s) were performed by non-physician practitioner and as supervising physician I was immediately available for consultation/collaboration.   EKG Interpretation None        Lauryl Seyer, MD 05/10/13 0738 

## 2013-08-11 ENCOUNTER — Ambulatory Visit: Payer: Self-pay | Admitting: Podiatrist

## 2013-09-03 ENCOUNTER — Ambulatory Visit: Payer: Self-pay | Admitting: Podiatrist

## 2013-11-22 ENCOUNTER — Encounter (HOSPITAL_COMMUNITY): Payer: Self-pay | Admitting: Emergency Medicine

## 2013-11-22 ENCOUNTER — Emergency Department (HOSPITAL_COMMUNITY)
Admission: EM | Admit: 2013-11-22 | Discharge: 2013-11-23 | Disposition: A | Discharge status: Home / Self Care | Payer: Medicaid Other | Attending: Emergency Medicine | Admitting: Emergency Medicine

## 2013-11-22 ENCOUNTER — Emergency Department (HOSPITAL_COMMUNITY): Payer: Medicaid Other

## 2013-11-22 DIAGNOSIS — Z791 Long term (current) use of non-steroidal anti-inflammatories (NSAID): Secondary | ICD-10-CM | POA: Insufficient documentation

## 2013-11-22 DIAGNOSIS — F419 Anxiety disorder, unspecified: Secondary | ICD-10-CM | POA: Diagnosis not present

## 2013-11-22 DIAGNOSIS — R51 Headache: Secondary | ICD-10-CM | POA: Insufficient documentation

## 2013-11-22 DIAGNOSIS — R079 Chest pain, unspecified: Secondary | ICD-10-CM | POA: Diagnosis present

## 2013-11-22 DIAGNOSIS — Z720 Tobacco use: Secondary | ICD-10-CM | POA: Insufficient documentation

## 2013-11-22 DIAGNOSIS — Z79899 Other long term (current) drug therapy: Secondary | ICD-10-CM | POA: Insufficient documentation

## 2013-11-22 DIAGNOSIS — R109 Unspecified abdominal pain: Secondary | ICD-10-CM | POA: Insufficient documentation

## 2013-11-22 DIAGNOSIS — J209 Acute bronchitis, unspecified: Secondary | ICD-10-CM | POA: Insufficient documentation

## 2013-11-22 DIAGNOSIS — M549 Dorsalgia, unspecified: Secondary | ICD-10-CM | POA: Insufficient documentation

## 2013-11-22 DIAGNOSIS — I10 Essential (primary) hypertension: Secondary | ICD-10-CM | POA: Diagnosis not present

## 2013-11-22 DIAGNOSIS — R110 Nausea: Secondary | ICD-10-CM | POA: Diagnosis not present

## 2013-11-22 LAB — CBC WITH DIFFERENTIAL/PLATELET
Basophils Absolute: 0 10*3/uL (ref 0.0–0.1)
Basophils Relative: 0 % (ref 0–1)
EOS ABS: 0.3 10*3/uL (ref 0.0–0.7)
EOS PCT: 3 % (ref 0–5)
HCT: 39.4 % (ref 36.0–46.0)
HEMOGLOBIN: 13.6 g/dL (ref 12.0–15.0)
LYMPHS ABS: 3.2 10*3/uL (ref 0.7–4.0)
LYMPHS PCT: 39 % (ref 12–46)
MCH: 31.4 pg (ref 26.0–34.0)
MCHC: 34.5 g/dL (ref 30.0–36.0)
MCV: 91 fL (ref 78.0–100.0)
MONOS PCT: 6 % (ref 3–12)
Monocytes Absolute: 0.5 10*3/uL (ref 0.1–1.0)
NEUTROS PCT: 52 % (ref 43–77)
Neutro Abs: 4.1 10*3/uL (ref 1.7–7.7)
Platelets: 221 10*3/uL (ref 150–400)
RBC: 4.33 MIL/uL (ref 3.87–5.11)
RDW: 12.8 % (ref 11.5–15.5)
WBC: 8.1 10*3/uL (ref 4.0–10.5)

## 2013-11-22 LAB — TROPONIN I: Troponin I: <0.3 ng/mL (ref <0.30)

## 2013-11-22 LAB — BASIC METABOLIC PANEL
Anion gap: 13 (ref 5–15)
BUN: 6 mg/dL (ref 6–23)
CO2: 27 meq/L (ref 19–32)
Calcium: 9.4 mg/dL (ref 8.4–10.5)
Chloride: 103 mEq/L (ref 96–112)
Creatinine, Ser: 0.73 mg/dL (ref 0.50–1.10)
GFR calc Af Amer: >90 mL/min (ref >90)
GLUCOSE: 105 mg/dL — AB (ref 70–99)
POTASSIUM: 3.7 meq/L (ref 3.7–5.3)
Sodium: 143 mEq/L (ref 137–147)

## 2013-11-22 NOTE — ED Notes (Signed)
Sharp chest pain, onset last night Has been sick for last 9 days with URI sx. With cough

## 2013-11-23 ENCOUNTER — Emergency Department (HOSPITAL_COMMUNITY): Payer: Medicaid Other

## 2013-11-23 MED ORDER — IOHEXOL 300 MG/ML  SOLN
80.0000 mL | Freq: Once | INTRAMUSCULAR | Status: AC | PRN
  Administered 2013-11-23: 80 mL via INTRAVENOUS

## 2013-11-23 MED ORDER — ALBUTEROL SULFATE HFA 108 (90 BASE) MCG/ACT IN AERS: 2.0000 | INHALATION_SPRAY | RESPIRATORY_TRACT | Status: DC | PRN

## 2013-11-23 MED ORDER — IBUPROFEN 800 MG PO TABS
800.0000 mg | ORAL_TABLET | Freq: Once | ORAL | Status: AC
  Administered 2013-11-23: 800 mg via ORAL
  Filled 2013-11-23: qty 1

## 2013-11-23 MED ORDER — IPRATROPIUM BROMIDE 0.02 % IN SOLN: 0.5000 mg | Freq: Once | RESPIRATORY_TRACT | Status: DC

## 2013-11-23 MED ORDER — PREDNISONE 20 MG PO TABS: 20.0000 mg | ORAL_TABLET | Freq: Two times a day (BID) | ORAL | Status: DC

## 2013-11-23 MED ORDER — IPRATROPIUM-ALBUTEROL 0.5-2.5 (3) MG/3ML IN SOLN
3.0000 mL | Freq: Once | RESPIRATORY_TRACT | Status: AC
  Administered 2013-11-23: 3 mL via RESPIRATORY_TRACT
  Filled 2013-11-23: qty 3

## 2013-11-23 MED ORDER — ALBUTEROL SULFATE (2.5 MG/3ML) 0.083% IN NEBU: 5.0000 mg | INHALATION_SOLUTION | Freq: Once | RESPIRATORY_TRACT | Status: DC

## 2013-11-23 MED ORDER — AZITHROMYCIN 250 MG PO TABS: ORAL_TABLET | ORAL | Status: DC

## 2013-11-23 MED ORDER — ALBUTEROL SULFATE (2.5 MG/3ML) 0.083% IN NEBU
2.5000 mg | INHALATION_SOLUTION | Freq: Once | RESPIRATORY_TRACT | Status: AC
  Administered 2013-11-23: 2.5 mg via RESPIRATORY_TRACT
  Filled 2013-11-23: qty 3

## 2013-11-23 MED ORDER — PREDNISONE 50 MG PO TABS
60.0000 mg | ORAL_TABLET | Freq: Every day | ORAL | Status: DC
  Administered 2013-11-23: 60 mg via ORAL
  Filled 2013-11-23 (×2): qty 1

## 2013-11-23 NOTE — Discharge Instructions (Signed)
Acute Bronchitis °Bronchitis is inflammation of the airways that extend from the windpipe into the lungs (bronchi). The inflammation often causes mucus to develop. This leads to a cough, which is the most common symptom of bronchitis.  °In acute bronchitis, the condition usually develops suddenly and goes away over time, usually in a couple weeks. Smoking, allergies, and asthma can make bronchitis worse. Repeated episodes of bronchitis may cause further lung problems.  °CAUSES °Acute bronchitis is most often caused by the same virus that causes a cold. The virus can spread from person to person (contagious) through coughing, sneezing, and touching contaminated objects. °SIGNS AND SYMPTOMS  °· Cough.   °· Fever.   °· Coughing up mucus.   °· Body aches.   °· Chest congestion.   °· Chills.   °· Shortness of breath.   °· Sore throat.   °DIAGNOSIS  °Acute bronchitis is usually diagnosed through a physical exam. Your health care provider will also ask you questions about your medical history. Tests, such as chest X-rays, are sometimes done to rule out other conditions.  °TREATMENT  °Acute bronchitis usually goes away in a couple weeks. Oftentimes, no medical treatment is necessary. Medicines are sometimes given for relief of fever or cough. Antibiotic medicines are usually not needed but may be prescribed in certain situations. In some cases, an inhaler may be recommended to help reduce shortness of breath and control the cough. A cool mist vaporizer may also be used to help thin bronchial secretions and make it easier to clear the chest.  °HOME CARE INSTRUCTIONS °· Get plenty of rest.   °· Drink enough fluids to keep your urine clear or pale yellow (unless you have a medical condition that requires fluid restriction). Increasing fluids may help thin your respiratory secretions (sputum) and reduce chest congestion, and it will prevent dehydration.   °· Take medicines only as directed by your health care provider. °· If  you were prescribed an antibiotic medicine, finish it all even if you start to feel better. °· Avoid smoking and secondhand smoke. Exposure to cigarette smoke or irritating chemicals will make bronchitis worse. If you are a smoker, consider using nicotine gum or skin patches to help control withdrawal symptoms. Quitting smoking will help your lungs heal faster.   °· Reduce the chances of another bout of acute bronchitis by washing your hands frequently, avoiding people with cold symptoms, and trying not to touch your hands to your mouth, nose, or eyes.   °· Keep all follow-up visits as directed by your health care provider.   °SEEK MEDICAL CARE IF: °Your symptoms do not improve after 1 week of treatment.  °SEEK IMMEDIATE MEDICAL CARE IF: °· You develop an increased fever or chills.   °· You have chest pain.   °· You have severe shortness of breath. °· You have bloody sputum.   °· You develop dehydration. °· You faint or repeatedly feel like you are going to pass out. °· You develop repeated vomiting. °· You develop a severe headache. °MAKE SURE YOU:  °· Understand these instructions. °· Will watch your condition. °· Will get help right away if you are not doing well or get worse. °Document Released: 03/14/2004 Document Revised: 06/21/2013 Document Reviewed: 07/28/2012 °[employer]® Patient Information ©2015 [employer]. This information is not intended to replace advice given to you by your health care provider. Make sure you discuss any questions you have with your health care provider. ° °Chest Pain (Nonspecific) °It is often hard to give a specific diagnosis for the cause of chest pain. There is always a chance   that your pain could be related to something serious, such as a heart attack or a blood clot in the lungs. You need to follow up with your health care provider for further evaluation. °CAUSES  °· Heartburn. °· Pneumonia or bronchitis. °· Anxiety or stress. °· Inflammation around your heart (pericarditis) or  lung (pleuritis or pleurisy). °· A blood clot in the lung. °· A collapsed lung (pneumothorax). It can develop suddenly on its own (spontaneous pneumothorax) or from trauma to the chest. °· Shingles infection (herpes zoster virus). °The chest wall is composed of bones, muscles, and cartilage. Any of these can be the source of the pain. °· The bones can be bruised by injury. °· The muscles or cartilage can be strained by coughing or overwork. °· The cartilage can be affected by inflammation and become sore (costochondritis). °DIAGNOSIS  °Lab tests or other studies may be needed to find the cause of your pain. Your health care provider may have you take a test called an ambulatory electrocardiogram (ECG). An ECG records your heartbeat patterns over a 24-hour period. You may also have other tests, such as: °· Transthoracic echocardiogram (TTE). During echocardiography, sound waves are used to evaluate how blood flows through your heart. °· Transesophageal echocardiogram (TEE). °· Cardiac monitoring. This allows your health care provider to monitor your heart rate and rhythm in real time. °· Holter monitor. This is a portable device that records your heartbeat and can help diagnose heart arrhythmias. It allows your health care provider to track your heart activity for several days, if needed. °· Stress tests by exercise or by giving medicine that makes the heart beat faster. °TREATMENT  °· Treatment depends on what may be causing your chest pain. Treatment may include: °¨ Acid blockers for heartburn. °¨ Anti-inflammatory medicine. °¨ Pain medicine for inflammatory conditions. °¨ Antibiotics if an infection is present. °· You may be advised to change lifestyle habits. This includes stopping smoking and avoiding alcohol, caffeine, and chocolate. °· You may be advised to keep your head raised (elevated) when sleeping. This reduces the chance of acid going backward from your stomach into your esophagus. °Most of the time,  nonspecific chest pain will improve within 2-3 days with rest and mild pain medicine.  °HOME CARE INSTRUCTIONS  °· If antibiotics were prescribed, take them as directed. Finish them even if you start to feel better. °· For the next few days, avoid physical activities that bring on chest pain. Continue physical activities as directed. °· Do not use any tobacco products, including cigarettes, chewing tobacco, or electronic cigarettes. °· Avoid drinking alcohol. °· Only take medicine as directed by your health care provider. °· Follow your health care provider's suggestions for further testing if your chest pain does not go away. °· Keep any follow-up appointments you made. If you do not go to an appointment, you could develop lasting (chronic) problems with pain. If there is any problem keeping an appointment, call to reschedule. °SEEK MEDICAL CARE IF:  °· Your chest pain does not go away, even after treatment. °· You have a rash with blisters on your chest. °· You have a fever. °SEEK IMMEDIATE MEDICAL CARE IF:  °· You have increased chest pain or pain that spreads to your arm, neck, jaw, back, or abdomen. °· You have shortness of breath. °· You have an increasing cough, or you cough up blood. °· You have severe back or abdominal pain. °· You feel nauseous or vomit. °· You have severe weakness. °·   You faint.  You have chills. This is an emergency. Do not wait to see if the pain will go away. Get medical help at once. Call your local emergency services (911 in [place] Do not drive yourself to the hospital. MAKE SURE YOU:   Understand these instructions.  Will watch your condition.  Will get help right away if you are not doing well or get worse. Document Released: 11/14/2004 Document Revised: 02/09/2013 Document Reviewed: 09/10/2007 [facility] Patient Information 2015 [place], [place]. This information is not intended to replace advice given to you by your health care provider. Make sure you discuss any  questions you have with your health care provider.  Smoking Cessation Quitting smoking is important to your health and has many advantages. However, it is not always easy to quit since nicotine is a very addictive drug. Oftentimes, people try 3 times or more before being able to quit. This document explains the best ways for you to prepare to quit smoking. Quitting takes hard work and a lot of effort, but you can do it. ADVANTAGES OF QUITTING SMOKING  You will live longer, feel better, and live better.  Your body will feel the impact of quitting smoking almost immediately.  Within 20 minutes, blood pressure decreases. Your pulse returns to its normal level.  After 8 hours, carbon monoxide levels in the blood return to normal. Your oxygen level increases.  After 24 hours, the chance of having a heart attack starts to decrease. Your breath, hair, and body stop smelling like smoke.  After 48 hours, damaged nerve endings begin to recover. Your sense of taste and smell improve.  After 72 hours, the body is virtually free of nicotine. Your bronchial tubes relax and breathing becomes easier.  After 2 to 12 weeks, lungs can hold more air. Exercise becomes easier and circulation improves.  The risk of having a heart attack, stroke, cancer, or lung disease is greatly reduced.  After 1 year, the risk of coronary heart disease is cut in half.  After 5 years, the risk of stroke falls to the same as a nonsmoker.  After 10 years, the risk of lung cancer is cut in half and the risk of other cancers decreases significantly.  After 15 years, the risk of coronary heart disease drops, usually to the level of a nonsmoker.  If you are pregnant, quitting smoking will improve your chances of having a healthy baby.  The people you live with, especially any children, will be healthier.  You will have extra money to spend on things other than cigarettes. QUESTIONS TO THINK ABOUT BEFORE ATTEMPTING TO  QUIT You may want to talk about your answers with your health care provider.  Why do you want to quit?  If you tried to quit in the past, what helped and what did not?  What will be the most difficult situations for you after you quit? How will you plan to handle them?  Who can help you through the tough times? Your family? Friends? A health care provider?  What pleasures do you get from smoking? What ways can you still get pleasure if you quit? Here are some questions to ask your health care provider:  How can you help me to be successful at quitting?  What medicine do you think would be best for me and how should I take it?  What should I do if I need more help?  What is smoking withdrawal like? How can I get information on withdrawal? GET  READY  Set a quit date.  Change your environment by getting rid of all cigarettes, ashtrays, matches, and lighters in your home, car, or work. Do not let people smoke in your home.  Review your past attempts to quit. Think about what worked and what did not. GET SUPPORT AND ENCOURAGEMENT You have a better chance of being successful if you have help. You can get support in many ways.  Tell your family, friends, and coworkers that you are going to quit and need their support. Ask them not to smoke around you.  Get individual, group, or telephone counseling and support. Programs are available at [employer] and health centers. Call your local health department for information about programs in your area.  Spiritual beliefs and practices may help some smokers quit.  Download a "quit meter" on your computer to keep track of quit statistics, such as how long you have gone without smoking, cigarettes not smoked, and money saved.  Get a self-help book about quitting smoking and staying off tobacco. [place] yourself from urges to smoke. Talk to someone, go for a walk, or occupy your time with a task.  Change  your normal routine. Take a different route to work. Drink tea instead of coffee. Eat breakfast in a different place.  Reduce your stress. Take a hot bath, exercise, or read a book.  Plan something enjoyable to do every day. Reward yourself for not smoking.  Explore interactive web-based programs that specialize in helping you quit. GET MEDICINE AND USE IT CORRECTLY Medicines can help you stop smoking and decrease the urge to smoke. Combining medicine with the above behavioral methods and support can greatly increase your chances of successfully quitting smoking.  Nicotine replacement therapy helps deliver nicotine to your body without the negative effects and risks of smoking. Nicotine replacement therapy includes nicotine gum, lozenges, inhalers, nasal sprays, and skin patches. Some may be available over-the-counter and others require a prescription.  Antidepressant medicine helps people abstain from smoking, but how this works is unknown. This medicine is available by prescription.  Nicotinic receptor partial agonist medicine simulates the effect of nicotine in your brain. This medicine is available by prescription. Ask your health care provider for advice about which medicines to use and how to use them based on your health history. Your health care provider will tell you what side effects to look out for if you choose to be on a medicine or therapy. Carefully read the information on the package. Do not use any other product containing nicotine while using a nicotine replacement product.  RELAPSE OR DIFFICULT SITUATIONS Most relapses occur within the first 3 months after quitting. Do not be discouraged if you start smoking again. Remember, most people try several times before finally quitting. You may have symptoms of withdrawal because your body is used to nicotine. You may crave cigarettes, be irritable, feel very hungry, cough often, get headaches, or have difficulty concentrating. The  withdrawal symptoms are only temporary. They are strongest when you first quit, but they will go away within 10-14 days. To reduce the chances of relapse, try to:  Avoid drinking alcohol. Drinking lowers your chances of successfully quitting.  Reduce the amount of caffeine you consume. Once you quit smoking, the amount of caffeine in your body increases and can give you symptoms, such as a rapid heartbeat, sweating, and anxiety.  Avoid smokers because they can make you want to smoke.  Do not let weight  gain distract you. Many smokers will gain weight when they quit, usually less than 10 pounds. Eat a healthy diet and stay active. You can always lose the weight gained after you quit.  Find ways to improve your mood other than smoking. FOR MORE INFORMATION  www.smokefree.gov  Document Released: 01/29/2001 Document Revised: 06/21/2013 Document Reviewed: 05/16/2011 [facility] Patient Information 2015 [place], [place]. This information is not intended to replace advice given to you by your health care provider. Make sure you discuss any questions you have with your health care provider.  Smoking Hazards Smoking cigarettes is extremely bad for your health. Tobacco smoke has over 200 known poisons in it. It contains the poisonous gases nitrogen oxide and carbon monoxide. There are over 60 chemicals in tobacco smoke that cause cancer. Some of the chemicals found in cigarette smoke include:   Cyanide.   Benzene.   Formaldehyde.   Methanol (wood alcohol).   Acetylene (fuel used in welding torches).   Ammonia.  Even smoking lightly shortens your life expectancy by several years. You can greatly reduce the risk of medical problems for you and your family by stopping now. Smoking is the most preventable cause of death and disease in our society. Within days of quitting smoking, your circulation improves, you decrease the risk of having a heart attack, and your lung capacity improves. There may  be some increased phlegm in the first few days after quitting, and it may take months for your lungs to clear up completely. Quitting for 10 years reduces your risk of developing lung cancer to almost that of a nonsmoker.  WHAT ARE THE RISKS OF SMOKING? Cigarette smokers have an increased risk of many serious medical problems, including:  Lung cancer.   Lung disease (such as pneumonia, bronchitis, and emphysema).   Heart attack and chest pain due to the heart not getting enough oxygen (angina).   Heart disease and peripheral blood vessel disease.   Hypertension.   Stroke.   Oral cancer (cancer of the lip, mouth, or voice box).   Bladder cancer.   Pancreatic cancer.   Cervical cancer.   Pregnancy complications, including premature birth.   Stillbirths and smaller newborn babies, birth defects, and genetic damage to sperm.   Early menopause.   Lower estrogen level for women.   Infertility.   Facial wrinkles.   Blindness.   Increased risk of broken bones (fractures).   Senile dementia.   Stomach ulcers and internal bleeding.   Delayed wound healing and increased risk of complications during surgery. Because of secondhand smoke exposure, children of smokers have an increased risk of the following:   Sudden infant death syndrome (SIDS).   Respiratory infections.   Lung cancer.   Heart disease.   Ear infections.  WHY IS SMOKING ADDICTIVE? Nicotine is the chemical agent in tobacco that is capable of causing addiction or dependence. When you smoke and inhale, nicotine is absorbed rapidly into the bloodstream through your lungs. Both inhaled and noninhaled nicotine may be addictive.  WHAT ARE THE BENEFITS OF QUITTING?  There are many health benefits to quitting smoking. Some are:   The likelihood of developing cancer and heart disease decreases. Health improvements are seen almost immediately.   Blood pressure, pulse rate, and breathing  patterns start returning to normal soon after quitting.   People who quit may see an improvement in their overall quality of life.  HOW DO YOU QUIT SMOKING? Smoking is an addiction with both physical and psychological effects, and longtime  habits can be hard to change. Your health care provider can recommend:  Programs and community resources, which may include group support, education, or therapy.  Replacement products, such as patches, gum, and nasal sprays. Use these products only as directed. Do not replace cigarette smoking with electronic cigarettes (commonly called e-cigarettes). The safety of e-cigarettes is unknown, and some may contain harmful chemicals. FOR MORE INFORMATION  American Lung Association: www.lung.org  American Cancer Society: www.cancer.org Document Released: 03/14/2004 Document Revised: 11/25/2012 Document Reviewed: 07/27/2012 [facility] Patient Information 2015 [place], [place]. This information is not intended to replace advice given to you by your health care provider. Make sure you discuss any questions you have with your health care provider.

## 2013-11-23 NOTE — ED Notes (Signed)
Pt alert & oriented x4, stable gait. Patient given discharge instructions, paperwork & prescription(s). Patient  instructed to stop at the registration desk to finish any additional paperwork. Patient verbalized understanding. Pt left department w/ no further questions. 

## 2013-11-23 NOTE — ED Provider Notes (Signed)
CSN: [id number]     Arrival date & time 11/22/13  2040 History  This chart was scribed for Richarda Blade, MD by Tula Nakayama, ED Scribe. This patient was seen in room APA03/APA03 and the patient's care was started at 12:02 AM.    No chief complaint on file.  The history is provided by the patient. No language interpreter was used.   HPI Comments: Kara Matthews is a 38 y.o. female who presents to the Emergency Department complaining of sharp, intermittent, waxing and waning right lateral chest pain that radiates posteriorly and started last night. She states productive cough and HA that started 9 days ago as associated symptoms. Pt smokes, but has no history of bronchitis or asthma. There are no other known modifying factors.   Past Medical History  Diagnosis Date  . Fibromyalgia   . Anxiety   . Hypertension    Past Surgical History  Procedure Laterality Date  . Cesarean section    . Cholecystectomy    . Abdominal hysterectomy     History reviewed. No pertinent family history. History  Substance Use Topics  . Smoking status: Current Every Day Smoker  . Smokeless tobacco: Not on file  . Alcohol Use: No   OB History   Grav Para Term Preterm Abortions TAB SAB Ect Mult Living                 Review of Systems  Respiratory: Positive for cough.   Cardiovascular: Positive for chest pain.  Gastrointestinal: Positive for nausea and abdominal pain (cramping).  Musculoskeletal: Positive for back pain.  Neurological: Positive for headaches.   Allergies  Bactrim and Codeine  Home Medications   Prior to Admission medications   Medication Sig Start Date End Date Taking? Authorizing Provider  ALPRAZolam Duanne Moron) 1 MG tablet Take 1 mg by mouth 3 (three) times daily as needed. Anxiety.    Historical Provider, MD  amphetamine-dextroamphetamine (ADDERALL XR) 30 MG 24 hr capsule Take 30 mg by mouth every morning.    Historical Provider, MD  amphetamine-dextroamphetamine (ADDERALL)  30 MG tablet Take 30 mg by mouth daily.    Historical Provider, MD  bisoprolol-hydrochlorothiazide [facility]) 5-6.25 MG per tablet Take 1 tablet by mouth daily.    Historical Provider, MD  estrogens, conjugated, (PREMARIN) 1.25 MG tablet Take 1.25 mg by mouth daily.    Historical Provider, MD  estrogens, conjugated, (PREMARIN) 1.25 MG tablet Take 1 tablet (1.25 mg total) by mouth daily. 08/05/12   Florian Buff, MD  gabapentin (NEURONTIN) 800 MG tablet Take 800 mg by mouth at bedtime.    Historical Provider, MD  HYDROcodone-acetaminophen (LORTAB) 7.5-500 MG per tablet Take 1 tablet by mouth 2 (two) times daily as needed. Severe back pain.    Historical Provider, MD  HYDROcodone-acetaminophen (NORCO/VICODIN) 5-325 MG per tablet Take 1 tablet by mouth every 6 (six) hours as needed for severe pain. 03/14/13   Norman Herrlich, NP  HYDROcodone-acetaminophen (NORCO/VICODIN) 5-325 MG per tablet Take one-two tabs po q 4-6 hrs prn pain 05/06/13   Tammy L. Triplett, PA-C  Menthol, Topical Analgesic, (ICY HOT EX) Apply 1 application topically 2 (two) times daily as needed. Back pain.    Historical Provider, MD  methocarbamol (ROBAXIN) 500 MG tablet Take 1 tablet (500 mg total) by mouth 2 (two) times daily. 03/14/13   Norman Herrlich, NP  naproxen (NAPROSYN) 500 MG tablet Take 1 tablet (500 mg total) by mouth 2 (two) times daily. 03/14/13  Norman Herrlich, NP  phenylephrine (SUDAFED PE) 10 MG TABS Take 10 mg by mouth every 4 (four) hours as needed. Sinus headache.    Historical Provider, MD  predniSONE (DELTASONE) 10 MG tablet Take 6 tablets day one, 5 tablets day two, 4 tablets day three, 3 tablets day four, 2 tablets day five, then 1 tablet day six 05/06/13   Tammy L. Triplett, PA-C   BP 117/75  Pulse 67  Temp(Src) 98.7 F (37.1 C) (Oral)  Resp 20  Ht 4\' 11"  (1.499 m)  Wt 167 lb (75.751 kg)  BMI 33.71 kg/m2  SpO2 100% Physical Exam  Nursing note and vitals reviewed. Constitutional: She is oriented to  person, place, and time. She appears well-developed and well-nourished.  HENT:  Head: Normocephalic and atraumatic.  Eyes: Conjunctivae and EOM are normal. Pupils are equal, round, and reactive to light.  Neck: Normal range of motion and phonation normal. Neck supple.  Cardiovascular: Normal rate and regular rhythm.   Pulmonary/Chest: Effort normal and breath sounds normal. She exhibits no tenderness.  Shallow inspirations  Abdominal: Soft. She exhibits no distension. There is no tenderness. There is no guarding.  Musculoskeletal: Normal range of motion.  Neurological: She is alert and oriented to person, place, and time. She exhibits normal muscle tone.  Skin: Skin is warm and dry.  Psychiatric: She has a normal mood and affect. Her behavior is normal. Judgment and thought content normal.    ED Course  Procedures (including critical care time)  Medications  predniSONE (DELTASONE) tablet 60 mg (60 mg Oral Given 11/23/13 0041)  ipratropium-albuterol (DUONEB) 0.5-2.5 (3) MG/3ML nebulizer solution 3 mL (3 mLs Nebulization Given 11/23/13 0031)  albuterol (PROVENTIL) (2.5 MG/3ML) 0.083% nebulizer solution 2.5 mg (2.5 mg Nebulization Given 11/23/13 0031)  iohexol (OMNIPAQUE) 300 MG/ML solution 80 mL (80 mLs Intravenous Contrast Given 11/23/13 0055)  ibuprofen (ADVIL,MOTRIN) tablet 800 mg (800 mg Oral Given 11/23/13 0234)    Patient Vitals for the past 24 hrs:  BP Temp Temp src Pulse Resp SpO2 Height Weight  11/23/13 0156 106/74 mmHg - - 72 18 95 % - -  11/23/13 0033 - - - - - 98 % - -  11/22/13 2352 117/75 mmHg - - 67 20 100 % - -  11/22/13 2045 130/81 mmHg 98.7 F (37.1 C) Oral 76 20 100 % 4\' 11"  (1.499 m) 167 lb (75.751 kg)    3:14 AM Reevaluation with update and discussion. After initial assessment and treatment, an updated evaluation reveals she feels better, now. There are no further complaints. Findings discussed with the patient, all questions answered.Daleen Bo L     DIAGNOSTIC STUDIES: Oxygen Saturation is 100% on RA, normal by my interpretation.    COORDINATION OF CARE: 12:07 AM Order CT Chest. Discussed treatment plan with pt at bedside and pt agreed to plan.  Labs Review Labs Reviewed  BASIC METABOLIC PANEL - Abnormal; Notable for the following:    Glucose, Bld 105 (*)    All other components within normal limits  CBC WITH DIFFERENTIAL  TROPONIN I    Imaging Review Dg Chest 2 View  11/22/2013   CLINICAL DATA:  Nine day history of sharp and pressure like right chest pain radiating into the upper back with productive cough and shortness of breath ; current smoker ; initial visit  EXAM: CHEST  2 VIEW  COMPARISON:  PA and lateral chest x-ray of June 16, 2009  FINDINGS: The lungs are adequately inflated. There is no focal  infiltrate. There is no pleural effusion or pneumothorax or pneumomediastinum. The interstitial markings are mildly prominent though stable. The cardiac silhouette is normal in size. There is prominence of the right suprahilar soft tissues. There is overlap of bony and soft tissue structures here however. The pulmonary vascularity is not engorged. The mediastinum is normal in width. The bony thorax is unremarkable.  IMPRESSION: There is no definite evidence of pneumonia. There may be right suprahilar lymphadenopathy, mass, or subsegmental atelectasis. Chest CT scanning is recommended.   Electronically Signed   By: David  Martinique   On: 11/22/2013 21:43     EKG Interpretation   Date/Time:  Monday November 22 2013 20:48:35 EDT Ventricular Rate:  70 PR Interval:  134 QRS Duration: 84 QT Interval:  388 QTC Calculation: 419 R Axis:   51 Text Interpretation:  Normal sinus rhythm Possible Left atrial enlargement  Borderline ECG since last tracing no significant change Confirmed by Eulis Foster   MD, Vira Agar ([id number]) on 11/22/2013 11:50:50 PM      MDM   Final diagnoses:  Chest pain, unspecified chest pain type  Acute bronchitis,  unspecified organism  Tobacco abuse    Evaluation is consistent with acute bronchitis, likely related to the use of tobacco products. Doubt pneumonia, ACS, metabolic instability, or serious bacterial infection.  Nursing Notes Reviewed/ Care Coordinated Applicable Imaging Reviewed Interpretation of Laboratory Data incorporated into ED treatment  The patient appears reasonably screened and/or stabilized for discharge and I doubt any other medical condition or other [facility] requiring further screening, evaluation, or treatment in the ED at this time prior to discharge.  Plan: Home Medications- Albuterol, Prednisone, Zithromax; Home Treatments- rest; return here if the recommended treatment, does not improve the symptoms; Recommended follow up- PCP prn  I personally performed the services described in this documentation, which was scribed in my presence. The recorded information has been reviewed and is accurate.      Richarda Blade, MD 11/23/13 [phone]

## 2013-11-25 ENCOUNTER — Other Ambulatory Visit: Payer: Self-pay | Admitting: Obstetrics & Gynecology

## 2014-10-03 ENCOUNTER — Encounter (HOSPITAL_COMMUNITY): Payer: Self-pay | Admitting: Emergency Medicine

## 2014-10-03 ENCOUNTER — Emergency Department (HOSPITAL_COMMUNITY)
Admission: EM | Admit: 2014-10-03 | Discharge: 2014-10-03 | Disposition: A | Discharge status: Home / Self Care | Payer: Medicaid Other | Attending: Emergency Medicine | Admitting: Emergency Medicine

## 2014-10-03 ENCOUNTER — Emergency Department (HOSPITAL_COMMUNITY): Payer: Medicaid Other

## 2014-10-03 DIAGNOSIS — Z79899 Other long term (current) drug therapy: Secondary | ICD-10-CM | POA: Diagnosis not present

## 2014-10-03 DIAGNOSIS — Y9289 Other specified places as the place of occurrence of the external cause: Secondary | ICD-10-CM | POA: Insufficient documentation

## 2014-10-03 DIAGNOSIS — Y998 Other external cause status: Secondary | ICD-10-CM | POA: Insufficient documentation

## 2014-10-03 DIAGNOSIS — M797 Fibromyalgia: Secondary | ICD-10-CM | POA: Diagnosis not present

## 2014-10-03 DIAGNOSIS — Y9389 Activity, other specified: Secondary | ICD-10-CM | POA: Insufficient documentation

## 2014-10-03 DIAGNOSIS — M545 Low back pain: Secondary | ICD-10-CM | POA: Diagnosis present

## 2014-10-03 DIAGNOSIS — S39011A Strain of muscle, fascia and tendon of abdomen, initial encounter: Secondary | ICD-10-CM

## 2014-10-03 DIAGNOSIS — X58XXXA Exposure to other specified factors, initial encounter: Secondary | ICD-10-CM | POA: Insufficient documentation

## 2014-10-03 DIAGNOSIS — I10 Essential (primary) hypertension: Secondary | ICD-10-CM | POA: Insufficient documentation

## 2014-10-03 DIAGNOSIS — F419 Anxiety disorder, unspecified: Secondary | ICD-10-CM | POA: Insufficient documentation

## 2014-10-03 DIAGNOSIS — Z720 Tobacco use: Secondary | ICD-10-CM | POA: Insufficient documentation

## 2014-10-03 DIAGNOSIS — R011 Cardiac murmur, unspecified: Secondary | ICD-10-CM | POA: Diagnosis not present

## 2014-10-03 DIAGNOSIS — T148XXA Other injury of unspecified body region, initial encounter: Secondary | ICD-10-CM

## 2014-10-03 LAB — URINE MICROSCOPIC-ADD ON

## 2014-10-03 LAB — URINALYSIS, ROUTINE W REFLEX MICROSCOPIC
Bilirubin Urine: NEGATIVE
Glucose, UA: NEGATIVE mg/dL
Ketones, ur: NEGATIVE mg/dL
LEUKOCYTES UA: NEGATIVE
NITRITE: NEGATIVE
PROTEIN: NEGATIVE mg/dL
UROBILINOGEN UA: 0.2 mg/dL (ref 0.0–1.0)
pH: 5.5 (ref 5.0–8.0)

## 2014-10-03 MED ORDER — DIAZEPAM 5 MG PO TABS
5.0000 mg | ORAL_TABLET | Freq: Once | ORAL | Status: AC
  Administered 2014-10-03: 5 mg via ORAL
  Filled 2014-10-03: qty 1

## 2014-10-03 MED ORDER — ONDANSETRON HCL 4 MG PO TABS
4.0000 mg | ORAL_TABLET | Freq: Once | ORAL | Status: AC
  Administered 2014-10-03: 4 mg via ORAL
  Filled 2014-10-03 (×2): qty 1

## 2014-10-03 MED ORDER — HYDROCODONE-ACETAMINOPHEN 5-325 MG PO TABS: 1.0000 | ORAL_TABLET | ORAL | Status: DC | PRN

## 2014-10-03 MED ORDER — METHOCARBAMOL 500 MG PO TABS: 500.0000 mg | ORAL_TABLET | Freq: Three times a day (TID) | ORAL | Status: DC

## 2014-10-03 MED ORDER — HYDROCODONE-ACETAMINOPHEN 5-325 MG PO TABS
1.0000 | ORAL_TABLET | Freq: Once | ORAL | Status: AC
  Administered 2014-10-03: 1 via ORAL
  Filled 2014-10-03: qty 1

## 2014-10-03 NOTE — Discharge Instructions (Signed)
Your x-ray is negative for fractures, dislocations, oral problems. I suspect that you have a muscle strain in the area. Please use Robaxin 3 times daily. Use Tylenol or ibuprofen for mild pain, use Norco for more severe pain. Muscle Strain A muscle strain (pulled muscle) happens when a muscle is stretched beyond normal length. It happens when a sudden, violent force stretches your muscle too far. Usually, a few of the fibers in your muscle are torn. Muscle strain is common in athletes. Recovery usually takes 1-2 weeks. Complete healing takes 5-6 weeks.  HOME CARE   Follow the PRICE method of treatment to help your injury get better. Do this the first 2-3 days after the injury:  Protect. Protect the muscle to keep it from getting injured again.  Rest. Limit your activity and rest the injured body part.  Ice. Put ice in a plastic bag. Place a towel between your skin and the bag. Then, apply the ice and leave it on from 15-20 minutes each hour. After the third day, switch to moist heat packs.  Compression. Use a splint or elastic bandage on the injured area for comfort. Do not put it on too tightly.  Elevate. Keep the injured body part above the level of your heart.  Only take medicine as told by your doctor.  Warm up before doing exercise to prevent future muscle strains. GET HELP IF:   You have more pain or puffiness (swelling) in the injured area.  You feel numbness, tingling, or notice a loss of strength in the injured area. MAKE SURE YOU:   Understand these instructions.  Will watch your condition.  Will get help right away if you are not doing well or get worse. Document Released: 11/14/2007 Document Revised: 11/25/2012 Document Reviewed: 09/03/2012 [facility] Patient Information 2015 [place], [place]. This information is not intended to replace advice given to you by your health care provider. Make sure you discuss any questions you have with your health care provider.

## 2014-10-03 NOTE — ED Provider Notes (Signed)
CSN: [id number]     Arrival date & time 10/03/14  1142 History  This chart was scribed for non-physician practitioner Lily Kocher, PA-C, working with Dorie Rank, MD by Zola Button, ED Scribe. This patient was seen in room APFT20/APFT20 and the patient's care was started at 1:09 PM.      Chief Complaint  Patient presents with  . Back Pain   The history is provided by the patient. No language interpreter was used.    HPI Comments: Kara Matthews is a 39 y.o. female who presents to the Emergency Department complaining of gradual onset, sharp left flank/rib pain that started during her sleep last night. Patient states the pain is worse with movement, laying down, and deep breathing. She denies any heavy lifting or change in activity. She does note having some gas. Patient denies abdominal pain. PSHx includes complete hysterectomy, cholecystectomy, and C-section.   Past Medical History  Diagnosis Date  . Fibromyalgia   . Anxiety   . Hypertension    Past Surgical History  Procedure Laterality Date  . Cesarean section    . Cholecystectomy    . Abdominal hysterectomy     History reviewed. No pertinent family history. Social History  Substance Use Topics  . Smoking status: Current Every Day Smoker  . Smokeless tobacco: None  . Alcohol Use: No   OB History    No data available     Review of Systems  Constitutional: Negative for activity change.  Gastrointestinal: Negative for abdominal pain.  Genitourinary: Positive for flank pain.  All other systems reviewed and are negative.     Allergies  Bactrim and Codeine  Home Medications   Prior to Admission medications   Medication Sig Start Date End Date Taking? Authorizing Provider  ALPRAZolam Duanne Moron) 1 MG tablet Take 1 mg by mouth 3 (three) times daily as needed. Anxiety.   Yes Historical Provider, MD  bisoprolol-hydrochlorothiazide (ZIAC) 5-6.25 MG per tablet Take 1 tablet by mouth daily.   Yes Historical Provider, MD   estrogens, conjugated, (PREMARIN) 1.25 MG tablet Take 1.25 mg by mouth daily.   Yes Historical Provider, MD  albuterol (PROVENTIL HFA;VENTOLIN HFA) 108 (90 BASE) MCG/ACT inhaler Inhale 2 puffs into the lungs every 4 (four) hours as needed for wheezing or shortness of breath. Patient not taking: Reported on 10/03/2014 11/23/13   Daleen Bo, MD  azithromycin (ZITHROMAX Z-PAK) 250 MG tablet 2 po day one, then 1 daily x 4 days Patient not taking: Reported on 10/03/2014 11/23/13   Daleen Bo, MD  HYDROcodone-acetaminophen (NORCO/VICODIN) 5-325 MG per tablet Take 1 tablet by mouth every 6 (six) hours as needed for severe pain. Patient not taking: Reported on 10/03/2014 03/14/13   Etta Quill, NP  HYDROcodone-acetaminophen (NORCO/VICODIN) 5-325 MG per tablet Take one-two tabs po q 4-6 hrs prn pain Patient not taking: Reported on 10/03/2014 05/06/13   Tammy Triplett, PA-C  methocarbamol (ROBAXIN) 500 MG tablet Take 1 tablet (500 mg total) by mouth 2 (two) times daily. Patient not taking: Reported on 10/03/2014 03/14/13   Etta Quill, NP  naproxen (NAPROSYN) 500 MG tablet Take 1 tablet (500 mg total) by mouth 2 (two) times daily. Patient not taking: Reported on 10/03/2014 03/14/13   Etta Quill, NP  predniSONE (DELTASONE) 10 MG tablet Take 6 tablets day one, 5 tablets day two, 4 tablets day three, 3 tablets day four, 2 tablets day five, then 1 tablet day six Patient not taking: Reported on 10/03/2014 05/06/13   Kem Parkinson, PA-C  predniSONE (DELTASONE) 20 MG tablet Take 1 tablet (20 mg total) by mouth 2 (two) times daily. Patient not taking: Reported on 10/03/2014 11/23/13   Daleen Bo, MD  PREMARIN 1.25 MG tablet TAKE (1) TABLET BY MOUTH ONCE DAILY. Patient not taking: Reported on 10/03/2014 11/25/13   Florian Buff, MD   BP 150/94 mmHg  Pulse 67  Temp(Src) 97.8 F (36.6 C) (Oral)  Resp 16  Ht 4\' 11"  (1.499 m)  Wt 174 lb (78.926 kg)  BMI 35.13 kg/m2  SpO2 100% Physical Exam  Constitutional: She is  oriented to person, place, and time. She appears well-developed and well-nourished. No distress.  HENT:  Head: Normocephalic and atraumatic.  Mouth/Throat: Oropharynx is clear and moist. No oropharyngeal exudate.  Eyes: Pupils are equal, round, and reactive to light.  Neck: Neck supple.  Cardiovascular: Normal rate.  Exam reveals no gallop and no friction rub.   Murmur heard.  Systolic murmur is present with a grade of 2/6  Pulmonary/Chest: Effort normal and breath sounds normal. No respiratory distress. She has no wheezes. She has no rales.  Normal pulmonary. Left mid rib area tender to palpation. No crepitus. No deformity appreciated. Pain extends to the anterior left rib area. Patient speaks in complete sentences.  Abdominal: Soft. Bowel sounds are normal. There is no splenomegaly.  Soft with good bowel sounds. No splenomegaly appreciated.  Musculoskeletal: She exhibits no edema.  Neurological: She is alert and oriented to person, place, and time. No cranial nerve deficit.  Skin: Skin is warm and dry. No rash noted.  Psychiatric: She has a normal mood and affect. Her behavior is normal.  Nursing note and vitals reviewed.   ED Course  Procedures  DIAGNOSTIC STUDIES: Oxygen Saturation is 99% on room air, normal by my interpretation.    COORDINATION OF CARE: 1:16 PM-Discussed treatment plan which includes rib XR imaging, muscle relaxer, pain medication, and UA with patient/guardian at bedside and patient/guardian agreed to plan.    Labs Review Labs Reviewed - No data to display  Imaging Review No results found. I, Tad Moore, personally reviewed and evaluated these images and lab results as part of my medical decision-making.   EKG Interpretation None      MDM  X-ray of the ribs and chest were negative for fracture or dislocation. The lung x-rays are negative for acute changes. Vital signs are within normal limits. Patient no distress at this time. Prescription for  Robaxin 3 times daily and 12 tablets of hydrocodone given to the patient.    Final diagnoses:  None    *I have reviewed nursing notes, vital signs, and all appropriate lab and imaging results for this patient.**  **I personally performed the services described in this documentation, which was scribed in my presence. The recorded information has been reviewed and is accurate.Lily Kocher, PA-C 10/03/14 [place], MD 10/04/14 [phone]

## 2014-10-03 NOTE — ED Notes (Signed)
Patient complaining of upper back pain since awakening this morning. States pain is worse with deep breathing and movement.

## 2014-10-19 ENCOUNTER — Encounter (HOSPITAL_COMMUNITY): Payer: Self-pay | Admitting: Emergency Medicine

## 2014-10-19 ENCOUNTER — Emergency Department (HOSPITAL_COMMUNITY)
Admission: EM | Admit: 2014-10-19 | Discharge: 2014-10-19 | Disposition: A | Discharge status: Home / Self Care | Payer: Medicaid Other | Attending: Emergency Medicine | Admitting: Emergency Medicine

## 2014-10-19 ENCOUNTER — Emergency Department (HOSPITAL_COMMUNITY): Payer: Medicaid Other

## 2014-10-19 DIAGNOSIS — Z87442 Personal history of urinary calculi: Secondary | ICD-10-CM | POA: Insufficient documentation

## 2014-10-19 DIAGNOSIS — Z8739 Personal history of other diseases of the musculoskeletal system and connective tissue: Secondary | ICD-10-CM | POA: Diagnosis not present

## 2014-10-19 DIAGNOSIS — R319 Hematuria, unspecified: Secondary | ICD-10-CM | POA: Insufficient documentation

## 2014-10-19 DIAGNOSIS — N12 Tubulo-interstitial nephritis, not specified as acute or chronic: Secondary | ICD-10-CM

## 2014-10-19 DIAGNOSIS — I10 Essential (primary) hypertension: Secondary | ICD-10-CM | POA: Insufficient documentation

## 2014-10-19 DIAGNOSIS — Z90710 Acquired absence of both cervix and uterus: Secondary | ICD-10-CM | POA: Diagnosis not present

## 2014-10-19 DIAGNOSIS — R109 Unspecified abdominal pain: Secondary | ICD-10-CM

## 2014-10-19 DIAGNOSIS — F419 Anxiety disorder, unspecified: Secondary | ICD-10-CM | POA: Diagnosis not present

## 2014-10-19 DIAGNOSIS — Z9049 Acquired absence of other specified parts of digestive tract: Secondary | ICD-10-CM | POA: Insufficient documentation

## 2014-10-19 DIAGNOSIS — Z79899 Other long term (current) drug therapy: Secondary | ICD-10-CM | POA: Insufficient documentation

## 2014-10-19 LAB — URINALYSIS, ROUTINE W REFLEX MICROSCOPIC
Bilirubin Urine: NEGATIVE
GLUCOSE, UA: NEGATIVE mg/dL
Ketones, ur: NEGATIVE mg/dL
Nitrite: NEGATIVE
PROTEIN: NEGATIVE mg/dL
Specific Gravity, Urine: >1.03 — ABNORMAL HIGH (ref 1.005–1.030)
Urobilinogen, UA: 0.2 mg/dL (ref 0.0–1.0)
pH: 5.5 (ref 5.0–8.0)

## 2014-10-19 LAB — URINE MICROSCOPIC-ADD ON

## 2014-10-19 MED ORDER — CEPHALEXIN 500 MG PO CAPS: 500.0000 mg | ORAL_CAPSULE | Freq: Four times a day (QID) | ORAL | Status: DC

## 2014-10-19 MED ORDER — PHENAZOPYRIDINE HCL 100 MG PO TABS
200.0000 mg | ORAL_TABLET | Freq: Once | ORAL | Status: AC
  Administered 2014-10-19: 200 mg via ORAL
  Filled 2014-10-19: qty 2

## 2014-10-19 MED ORDER — LIDOCAINE HCL (PF) 1 % IJ SOLN
INTRAMUSCULAR | Status: AC
  Administered 2014-10-19: 2.1 mL
  Filled 2014-10-19: qty 5

## 2014-10-19 MED ORDER — CEFTRIAXONE SODIUM 1 G IJ SOLR
1.0000 g | Freq: Once | INTRAMUSCULAR | Status: AC
  Administered 2014-10-19: 1 g via INTRAMUSCULAR
  Filled 2014-10-19: qty 10

## 2014-10-19 MED ORDER — PHENAZOPYRIDINE HCL 200 MG PO TABS: 200.0000 mg | ORAL_TABLET | Freq: Three times a day (TID) | ORAL | Status: DC

## 2014-10-19 MED ORDER — ONDANSETRON 4 MG PO TBDP: 4.0000 mg | ORAL_TABLET | Freq: Three times a day (TID) | ORAL | Status: DC | PRN

## 2014-10-19 NOTE — ED Notes (Signed)
Pt c/o right side flank pain that started yesterday, has hx of kidney stones and states that this pain feels the same,

## 2014-10-19 NOTE — ED Provider Notes (Signed)
CSN: [id number]     Arrival date & time 10/19/14  2025 History   First MD Initiated Contact with Patient 10/19/14 2306     Chief Complaint  Patient presents with  . Flank Pain     (Consider location/radiation/quality/duration/timing/severity/associated sxs/prior Treatment) Patient is a 39 y.o. female presenting with hematuria. The history is provided by the patient.  Hematuria This is a new problem. The current episode started today. The problem occurs constantly. The problem has been gradually worsening. Associated symptoms include abdominal pain, nausea and urinary symptoms. Pertinent negatives include no chest pain, chills, coughing, fever, headaches or rash.   AHNYA AKRE is a 39 y.o. female who presents to the ED with dysuria and blood in her urine that stated last night. She has a hx of kidney stones and thought she may have one again because of the pain on the right side of her back and the blood in the urine. She has had mild nausea but no vomiting. She has had frequent urination but only goes a little.   Past Medical History  Diagnosis Date  . Fibromyalgia   . Anxiety   . Hypertension    Past Surgical History  Procedure Laterality Date  . Cesarean section    . Cholecystectomy    . Abdominal hysterectomy     No family history on file. Social History  Substance Use Topics  . Smoking status: Current Every Day Smoker -- 0.50 packs/day    Types: Cigarettes  . Smokeless tobacco: None  . Alcohol Use: No   OB History    No data available     Review of Systems  Constitutional: Negative for fever and chills.  HENT: Negative.   Eyes: Negative for visual disturbance.  Respiratory: Negative for cough and shortness of breath.   Cardiovascular: Negative for chest pain.  Gastrointestinal: Positive for nausea and abdominal pain.  Genitourinary: Positive for dysuria, urgency, frequency and hematuria. Negative for vaginal bleeding and vaginal discharge.  Musculoskeletal:  Positive for back pain.  Skin: Negative for rash.  Neurological: Negative for syncope and headaches.  Psychiatric/Behavioral: Negative for confusion. The patient is not nervous/anxious.       Allergies  Bactrim and Codeine  Home Medications   Prior to Admission medications   Medication Sig Start Date End Date Taking? Authorizing Provider  ALPRAZolam Duanne Moron) 1 MG tablet Take 0.5 mg by mouth at bedtime as needed for anxiety or sleep.    Yes Historical Provider, MD  Aspirin-Acetaminophen-Caffeine (GOODY HEADACHE PO) Take 1 packet by mouth daily as needed (for pain).   Yes Historical Provider, MD  bisoprolol-hydrochlorothiazide (ZIAC) 5-6.25 MG per tablet Take 1 tablet by mouth daily.   Yes Historical Provider, MD  cephALEXin (KEFLEX) 500 MG capsule Take 1 capsule (500 mg total) by mouth 4 (four) times daily. 10/19/14   Brooklee Michelin Bunnie Pion, NP  estrogens, conjugated, (PREMARIN) 1.25 MG tablet Take 1.25 mg by mouth daily.    Historical Provider, MD  ondansetron (ZOFRAN ODT) 4 MG disintegrating tablet Take 1 tablet (4 mg total) by mouth every 8 (eight) hours as needed for nausea or vomiting. 10/19/14   Jordyn Doane Bunnie Pion, NP  phenazopyridine (PYRIDIUM) 200 MG tablet Take 1 tablet (200 mg total) by mouth 3 (three) times daily. 10/19/14   Dakayla Disanti Bunnie Pion, NP  PREMARIN 1.25 MG tablet TAKE (1) TABLET BY MOUTH ONCE DAILY. Patient not taking: Reported on 10/03/2014 11/25/13   Florian Buff, MD   BP 125/86 mmHg  Pulse 79  Temp(Src) 98.5 F (36.9 C) (Oral)  Resp 20  Ht 4\' 11"  (1.499 m)  Wt 174 lb (78.926 kg)  BMI 35.13 kg/m2  SpO2 99% Physical Exam  Constitutional: She is oriented to person, place, and time. She appears well-developed and well-nourished. No distress.  HENT:  Head: Normocephalic and atraumatic.  Eyes: EOM are normal.  Neck: Neck supple.  Cardiovascular: Normal rate and regular rhythm.   Pulmonary/Chest: Effort normal and breath sounds normal.  Abdominal: Soft. Bowel sounds are normal. There is  tenderness in the suprapubic area. There is CVA tenderness (right). There is no rebound and no guarding.  Genitourinary:  hysterectomy  Musculoskeletal: Normal range of motion.  Neurological: She is alert and oriented to person, place, and time. No cranial nerve deficit.  Skin: Skin is warm and dry.  Psychiatric: She has a normal mood and affect. Her behavior is normal.  Nursing note and vitals reviewed.   ED Course  Procedures  Results for orders placed or performed during the hospital encounter of 10/19/14 (from the past 24 hour(s))  Urinalysis, Routine w reflex microscopic (not at [facility])     Status: Abnormal   Collection Time: 10/19/14 10:58 PM  Result Value Ref Range   Color, Urine YELLOW YELLOW   APPearance HAZY (A) CLEAR   Specific Gravity, Urine >1.030 (H) 1.005 - 1.030   pH 5.5 5.0 - 8.0   Glucose, UA NEGATIVE NEGATIVE mg/dL   Hgb urine dipstick LARGE (A) NEGATIVE   Bilirubin Urine NEGATIVE NEGATIVE   Ketones, ur NEGATIVE NEGATIVE mg/dL   Protein, ur NEGATIVE NEGATIVE mg/dL   Urobilinogen, UA 0.2 0.0 - 1.0 mg/dL   Nitrite NEGATIVE NEGATIVE   Leukocytes, UA SMALL (A) NEGATIVE  Urine microscopic-add on     Status: Abnormal   Collection Time: 10/19/14 10:58 PM  Result Value Ref Range   Squamous Epithelial / LPF FEW (A) RARE   WBC, UA TOO NUMEROUS TO COUNT <3 WBC/hpf   RBC / HPF TOO NUMEROUS TO COUNT <3 RBC/hpf   Bacteria, UA MANY (A) RARE     Imaging Review Ct Renal Stone Study  10/19/2014   CLINICAL DATA:  Right greater than left flank pain  EXAM: CT ABDOMEN AND PELVIS WITHOUT CONTRAST  TECHNIQUE: Multidetector CT imaging of the abdomen and pelvis was performed following the standard protocol without IV contrast.  COMPARISON:  09/06/2005  FINDINGS: Lower chest:  Lung bases are clear.  Hepatobiliary: Liver is unremarkable. Cholecystectomy clips are noted.  Pancreas: Normal  Spleen: Normal  Adrenals/Urinary Tract: Adrenal glands are unremarkable. 1 mm nonobstructing right  mid renal calculus image 40. No hydroureteronephrosis. No radiopaque ureteral or bladder calculus. 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  Stomach/Bowel: Normal appendix. No bowel wall thickening or focal segmental dilatation is identified.  Vascular/Lymphatic: No aortic aneurysm. Mild atheromatous aortic calcification. No lymphadenopathy.  Other: Right ovary appears normal. Left ovary and uterus presumed surgically absent, not visualized.  No free air or fluid.  Musculoskeletal: No acute osseous abnormality.  IMPRESSION: No acute intra-abdominal or pelvic pathology.   Electronically Signed   By: Conchita Paris M.D.   On: 10/19/2014 22:35   Rocephin 1 gram IM, Pyridium 200 mg PO prior to d/c. Rx Keflex and pyridium.  MDM  39 y.o. female with UTI symptoms and hematuria. Stable for d/c without kidney stone on CT scan. Will treat for UTI and urine sent for culture. Discussed with the patient and all questioned fully answered.  She will follow up with her PCP or return here if any problems arise.   Final diagnoses:  Pyelonephritis      [facility], NP 10/19/14 [id number]  Rolland Porter, MD 10/19/14 [phone]

## 2014-10-19 NOTE — ED Notes (Signed)
Bilateral flank pain, worse on the right. Bloody, scant amount of urine

## 2014-10-22 LAB — URINE CULTURE

## 2014-10-24 ENCOUNTER — Telehealth (HOSPITAL_COMMUNITY): Payer: Self-pay

## 2014-10-24 NOTE — Telephone Encounter (Signed)
Post ED Visit - Positive Culture Follow-up  Culture report reviewed by antimicrobial stewardship pharmacist: []  Wes [place], Pharm.D., BCPS []  Heide Guile, Pharm.D., BCPS []  Alycia Rossetti, Pharm.D., BCPS []  [place], Pharm.D., BCPS, AAHIVP [x]  Legrand [place], Pharm.D., BCPS, AAHIVP []  Isac Sarna, Pharm.D., BCPS  Positive Urine culture, 40,000 colonies  -> Citrobacter Koseri Treated with Cephalexin, organism sensitive to the same and no further patient follow-up is required at this time.  Dortha Kern 10/24/2014, 4:50 AM

## 2014-11-23 ENCOUNTER — Other Ambulatory Visit: Payer: Medicaid Other | Admitting: Obstetrics and Gynecology

## 2015-01-26 ENCOUNTER — Other Ambulatory Visit: Payer: Self-pay | Admitting: Obstetrics & Gynecology

## 2015-03-09 ENCOUNTER — Ambulatory Visit (INDEPENDENT_AMBULATORY_CARE_PROVIDER_SITE_OTHER): Payer: Medicaid Other | Admitting: Obstetrics & Gynecology

## 2015-03-09 ENCOUNTER — Encounter: Payer: Self-pay | Admitting: Obstetrics & Gynecology

## 2015-03-09 VITALS — BP 140/90 | HR 76 | Wt 176.0 lb

## 2015-03-09 DIAGNOSIS — B373 Candidiasis of vulva and vagina: Secondary | ICD-10-CM | POA: Diagnosis not present

## 2015-03-09 DIAGNOSIS — B3731 Acute candidiasis of vulva and vagina: Secondary | ICD-10-CM

## 2015-03-09 MED ORDER — MICONAZOLE NITRATE 2 % VA CREA: 1.0000 | TOPICAL_CREAM | Freq: Every day | VAGINAL | Status: DC

## 2015-03-09 MED ORDER — FLUCONAZOLE 100 MG PO TABS: 100.0000 mg | ORAL_TABLET | Freq: Every day | ORAL | Status: DC

## 2015-03-09 NOTE — Progress Notes (Signed)
Patient ID: Kara Matthews, female   DOB: 1975-04-28, 40 y.o.   MRN: [id number]      Chief Complaint  Patient presents with  . work-in-gyn visit    vaginal discharge    Blood pressure 140/90, pulse 76, weight 176 lb (79.833 kg).  40 y.o. No obstetric history on file. No LMP recorded. Patient has had a hysterectomy. The current method of family planning is status post hysterectomy.  Subjective Vaginal burning and irritation for 4 days No antibiotics No odor No new sex partners  Objective Erythema of vulva  Hemorrhoid present Vaginal yeast present  Painted with gentian violet  Pertinent ROS No burning with urination, frequency or urgency No nausea, vomiting or diarrhea Nor fever chills or other constitutional symptoms   Labs or studies     Impression Diagnoses this Encounter::   ICD-9-CM ICD-10-CM   1. Yeast vaginitis 112.1 B37.3     Established relevant diagnosis(es): S/p oophorectomy not been taking her hormones  Plan/Recommendations: Meds ordered this encounter  Medications  . fluconazole (DIFLUCAN) 100 MG tablet    Sig: Take 1 tablet (100 mg total) by mouth daily.    Dispense:  7 tablet    Refill:  0  . miconazole (MONISTAT 7) 2 % vaginal cream    Sig: Place 1 Applicatorful vaginally at bedtime.    Dispense:  45 g    Refill:  0    Labs or Scans Ordered: No orders of the defined types were placed in this encounter.    Management:: GV placed plus meds ordered  Follow up prn          All questions were answered.

## 2015-05-29 ENCOUNTER — Encounter: Payer: Self-pay | Admitting: *Deleted

## 2015-05-29 ENCOUNTER — Ambulatory Visit (INDEPENDENT_AMBULATORY_CARE_PROVIDER_SITE_OTHER): Payer: Medicaid Other | Admitting: Cardiology

## 2015-05-29 ENCOUNTER — Encounter: Payer: Self-pay | Admitting: Cardiology

## 2015-05-29 VITALS — BP 135/85 | HR 77 | Ht 60.0 in | Wt 177.0 lb

## 2015-05-29 DIAGNOSIS — E785 Hyperlipidemia, unspecified: Secondary | ICD-10-CM

## 2015-05-29 DIAGNOSIS — R011 Cardiac murmur, unspecified: Secondary | ICD-10-CM | POA: Diagnosis not present

## 2015-05-29 DIAGNOSIS — Z720 Tobacco use: Secondary | ICD-10-CM

## 2015-05-29 DIAGNOSIS — Z8249 Family history of ischemic heart disease and other diseases of the circulatory system: Secondary | ICD-10-CM

## 2015-05-29 DIAGNOSIS — I10 Essential (primary) hypertension: Secondary | ICD-10-CM

## 2015-05-29 NOTE — Progress Notes (Signed)
Cardiology Office Note  Date: 05/29/2015   ID: Kara Matthews, DOB [birth year], MRN [id number]  PCP: [facility]  Consulting Cardiologist: Rozann Lesches, MD   Chief Complaint  Patient presents with  . Heart Murmur    History of Present Illness: Kara Matthews is a 40 y.o. female referred for cardiology consultation by Ms. House NP. She comes in for evaluation of a recently documented heart murmur on routine examination. It sounds like she was also diagnosed with significant hyperlipidemia, I do not yet have the lab results for review. She has a longer standing history of hypertension, also family history of premature atherosclerotic disease manifested as stroke in her parents, also heart failure in her brother who died in his 42s.  She tells me that she occasionally experiences palpitations, no dizziness or syncope. Has also had a history of intermittent, somewhat atypical chest discomfort. She reports a long-standing tobacco use history, states that she is trying to quit at this time.  She has not undergone any prior cardiac structural testing, no echocardiogram found. I did review her most recent ECG. She takes Ziac for blood pressure, has just recently been started on omega-3 supplements as well.  Past Medical History  Diagnosis Date  . Fibromyalgia   . Anxiety   . Essential hypertension   . Hyperlipidemia     Past Surgical History  Procedure Laterality Date  . Cesarean section    . Cholecystectomy    . Abdominal hysterectomy      Current Outpatient Prescriptions  Medication Sig Dispense Refill  . bisoprolol-hydrochlorothiazide (ZIAC) 5-6.25 MG tablet Take 1 tablet by mouth daily.    . Omega-3 Fatty Acids (FISH OIL) 1000 MG CAPS Take 1 capsule by mouth 2 (two) times daily.    Marland Kitchen PREMARIN 1.25 MG tablet TAKE (1) TABLET BY MOUTH ONCE DAILY. 30 tablet 11  . [DISCONTINUED] albuterol (PROVENTIL HFA;VENTOLIN HFA) 108 (90 BASE) MCG/ACT inhaler Inhale 2 puffs  into the lungs every 4 (four) hours as needed for wheezing or shortness of breath. (Patient not taking: Reported on 10/03/2014) 6.7 g 0   No current facility-administered medications for this visit.   Allergies:  Bactrim and Codeine   Social History: The patient  reports that she has been smoking Cigarettes.  She has been smoking about 0.50 packs per day. She does not have any smokeless tobacco history on file. She reports that she does not drink alcohol or use illicit drugs.   Family History: The patient's family history includes Heart failure in her brother; Stroke in her father and mother.   ROS:  Please see the history of present illness. Otherwise, complete review of systems is positive for family psychological stressors.  All other systems are reviewed and negative.   Physical Exam: VS:  BP 135/85 mmHg  Pulse 77  Ht 5' (1.524 m)  Wt 177 lb (80.287 kg)  BMI 34.57 kg/m2  SpO2 98%, BMI Body mass index is 34.57 kg/(m^2).  Wt Readings from Last 3 Encounters:  05/29/15 177 lb (80.287 kg)  03/09/15 176 lb (79.833 kg)  10/19/14 174 lb (78.926 kg)    General: Short statured, overweight woman, appears comfortable at rest. HEENT: Conjunctiva and lids normal, oropharynx clear. Neck: Supple, no elevated JVP or carotid bruits, no thyromegaly. Lungs: Clear to auscultation, nonlabored breathing at rest. Cardiac: Regular rate and rhythm, no S3, 2/6 basal systolic murmur radiating up to the neck, no pericardial rub. Abdomen: Soft, nontender, bowel sounds present, no guarding  or rebound. Extremities: No pitting edema, distal pulses 2+. Skin: Warm and dry. Multiple tattoos. Musculoskeletal: No kyphosis. Neuropsychiatric: Alert and oriented x3, affect grossly appropriate.  ECG: I personally reviewed the previous tracing from 11/22/2013 which showed sinus rhythm with possible left atrial enlargement.  Recent Labwork:  October 2015: Hemoglobin 13.6, platelets 221, potassium 3.7, BUN 6,  creatinine 0.7  Other Studies Reviewed Today:  Chest CT 11/23/2013: FINDINGS: The heart size is normal. No significant pleural or pericardial effusion is present. There is no significant mediastinal or axillary adenopathy. A 9 mm hypodense lesion is present within the right lobe of the thyroid. A smaller lesion is present on the left. The thoracic inlet is otherwise unremarkable.  Diffuse fatty infiltration is present in the liver. The patient is status post cholecystectomy. Limited imaging of the upper abdomen is otherwise unremarkable.  The lung windows demonstrate no focal nodule, mass, or airspace disease.  No focal lesion is evident in the area suspected by chest radiograph. This likely represents overlap shadows.  Bone windows are unremarkable.  IMPRESSION: 1. No focal lesion to explain the abnormality on chest x-ray. This likely represents overlap of shadows. 2. Sub cm thyroid nodules. These are likely benign. 3. Hepatic steatosis.  Assessment and Plan:  1. Heart murmur, aortic position. Duration uncertain. Not entirely clear that this is a symptom provoking issue, but echocardiogram is needed to further clarify valvular status and also LVEF. Will also provide information regarding follow-up plan.  2. Long-standing history of tobacco abuse. We discussed smoking cessation today. She states she is trying to cut back and quit.  3. Reported hyperlipidemia, details not clear. Requesting lab work from [facility] for review. She has been recently placed on omega-3 supplements.  4. Family history of premature atherosclerotic disease. We discussed risk factor modification strategies.  5. Essential hypertension, no changes made to current regimen.  Current medicines were reviewed with the patient today.   Orders Placed This Encounter  Procedures  . ECHOCARDIOGRAM COMPLETE    Disposition: Call with results.   Signed, Satira Sark, MD, [facility] 05/29/2015 2:32 PM      [facility] [place], [place], [place] [address] Phone: [phone]; Fax: [phone]

## 2015-05-29 NOTE — Patient Instructions (Signed)
Your physician recommends that you continue on your current medications as directed. Please refer to the Current Medication list given to you today. Your physician has requested that you have an echocardiogram. Echocardiography is a painless test that uses sound waves to create images of your heart. It provides your doctor with information about the size and shape of your heart and how well your heart's chambers and valves are working. This procedure takes approximately one hour. There are no restrictions for this procedure. Your follow up will be based on your results.

## 2015-05-31 ENCOUNTER — Ambulatory Visit (INDEPENDENT_AMBULATORY_CARE_PROVIDER_SITE_OTHER): Payer: Medicaid Other

## 2015-05-31 ENCOUNTER — Other Ambulatory Visit: Payer: Self-pay

## 2015-05-31 ENCOUNTER — Encounter: Payer: Self-pay | Admitting: Cardiology

## 2015-05-31 DIAGNOSIS — R011 Cardiac murmur, unspecified: Secondary | ICD-10-CM

## 2015-06-01 ENCOUNTER — Encounter: Payer: Self-pay | Admitting: *Deleted

## 2015-06-01 ENCOUNTER — Telehealth: Payer: Self-pay | Admitting: *Deleted

## 2015-06-01 NOTE — Telephone Encounter (Signed)
Patient informed and verbalized understanding of plan. 

## 2015-06-01 NOTE — Telephone Encounter (Signed)
-----   Message from Satira Sark, MD sent at 06/01/2015  7:32 AM EDT ----- I reviewed the study. Images of the aortic valve are difficult to interpret. Does not look to be trileaflet, mild thickening noted. Suspect congenitally abnormal aortic valve (such as a bicuspid valve) but leaflet structure is difficult to identify. There is evidence of at least mild aortic stenosis based on gradients which would explain her cardiac murmur. Her LVEF is normal range. For now would recommend arranging a follow-up echocardiogram in no more than one year's time with clinical reassessment. We can see her sooner if symptoms worsen.

## 2015-10-05 ENCOUNTER — Telehealth: Payer: Self-pay | Admitting: Cardiology

## 2015-10-05 NOTE — Telephone Encounter (Signed)
YESTERDAY patient had an episode where she had a horrible headache along with chest pain.  Today headache is still present with no chest pain BP 141/92 at 12:30  Stated that everything on her left side is weak and left eye keeps batten?  Chest is fluttering but not hurting.

## 2015-10-05 NOTE — Telephone Encounter (Signed)
Patient c/o chest pain that started yesterday evening rated 10/10, dizziness and sob. Patient said this happened while she was at work but she was able to drive herself home. Patient nauseated but no vomiting or swelling. Patient having active chest pain rated 4-5/10. No active dizziness or nausea. Patient also c/o headache that started yesterday but no fever. Patient took a goody powder but did not get any relief. Patient said she did not go to the ED because her head was hurting so bad that all she could do was sleep. Patient advised to go to the ED for an evaluation now. Patient verbalized understanding of plan.

## 2016-03-03 ENCOUNTER — Encounter (HOSPITAL_COMMUNITY): Payer: Self-pay | Admitting: Emergency Medicine

## 2016-03-03 ENCOUNTER — Emergency Department (HOSPITAL_COMMUNITY)
Admission: EM | Admit: 2016-03-03 | Discharge: 2016-03-03 | Disposition: A | Discharge status: Home / Self Care | Payer: Medicaid Other | Attending: Emergency Medicine | Admitting: Emergency Medicine

## 2016-03-03 ENCOUNTER — Emergency Department (HOSPITAL_COMMUNITY): Payer: Medicaid Other

## 2016-03-03 DIAGNOSIS — Z79899 Other long term (current) drug therapy: Secondary | ICD-10-CM | POA: Insufficient documentation

## 2016-03-03 DIAGNOSIS — F17210 Nicotine dependence, cigarettes, uncomplicated: Secondary | ICD-10-CM | POA: Diagnosis not present

## 2016-03-03 DIAGNOSIS — M545 Low back pain: Secondary | ICD-10-CM | POA: Diagnosis present

## 2016-03-03 DIAGNOSIS — Z791 Long term (current) use of non-steroidal anti-inflammatories (NSAID): Secondary | ICD-10-CM | POA: Insufficient documentation

## 2016-03-03 DIAGNOSIS — M5442 Lumbago with sciatica, left side: Secondary | ICD-10-CM | POA: Diagnosis not present

## 2016-03-03 DIAGNOSIS — M5432 Sciatica, left side: Secondary | ICD-10-CM

## 2016-03-03 DIAGNOSIS — I10 Essential (primary) hypertension: Secondary | ICD-10-CM | POA: Diagnosis not present

## 2016-03-03 MED ORDER — HYDROCODONE-ACETAMINOPHEN 5-325 MG PO TABS: 1.0000 | ORAL_TABLET | ORAL | 0 refills | Status: DC | PRN

## 2016-03-03 MED ORDER — KETOROLAC TROMETHAMINE 60 MG/2ML IM SOLN
60.0000 mg | Freq: Once | INTRAMUSCULAR | Status: AC
  Administered 2016-03-03: 60 mg via INTRAMUSCULAR
  Filled 2016-03-03: qty 2

## 2016-03-03 MED ORDER — CYCLOBENZAPRINE HCL 5 MG PO TABS: 5.0000 mg | ORAL_TABLET | Freq: Three times a day (TID) | ORAL | 0 refills | Status: DC | PRN

## 2016-03-03 MED ORDER — DICLOFENAC SODIUM 75 MG PO TBEC: 75.0000 mg | DELAYED_RELEASE_TABLET | Freq: Two times a day (BID) | ORAL | 0 refills | Status: DC

## 2016-03-03 NOTE — Discharge Instructions (Signed)
Do not drive within 4 hours of taking hydrocodone as this will make you drowsy.  Avoid lifting,  Bending,  Twisting or any other activity that worsens your pain over the next week.  Apply a heating pad to your lower back 20 minutes several times daily.   You should get rechecked if your symptoms are not better over the next 5 days,  Or you develop increased pain,  Weakness in your leg(s) or loss of bladder or bowel function - these are symptoms of a worse injury.

## 2016-03-03 NOTE — ED Triage Notes (Signed)
Intermittent L hip pain X 1 month. Denies injury.

## 2016-03-03 NOTE — ED Provider Notes (Signed)
[facility] DEPT Provider Note   CSN: [id number] Arrival date & time: 03/03/16  1653   By signing my name below, I, Collene Leyden, attest that this documentation has been prepared under the direction and in the presence of Evalee Jefferson, PA-C Electronically Signed: Collene Leyden, Scribe. 03/03/16. 6:26 PM.  History   Chief Complaint Chief Complaint  Patient presents with  . Hip Pain    HPI Comments: Kara Matthews is a 41 y.o. female with a hx of fibromyalgia, who presents to the Emergency Department complaining of left-sided back pain that began one month ago. Patient states she had just woke up when the pain began and denies any injury or falls. She reports worsening pain when sitting, lying down, and walking. Patient reports that she has  Chronic fibromyalgia but her hip pain is different. Her fibromyalgia is not being treated with anything. She describes the pain as sharp which radiates to her left heel. She denies any new injuries and has no loss of control of bowel or bladder.  She denies hx of ca or ivdu. She does have a history of osteoporosis.  She has seen her pcp for this problem and is currently awaiting for a referral to Dr. Merlene Laughter.    The history is provided by the patient. No language interpreter was used.    Past Medical History:  Diagnosis Date  . Anxiety   . Essential hypertension   . Fibromyalgia   . Hyperlipidemia     There are no active problems to display for this patient.   Past Surgical History:  Procedure Laterality Date  . ABDOMINAL HYSTERECTOMY    . CESAREAN SECTION    . CHOLECYSTECTOMY      OB History    No data available       Home Medications    Prior to Admission medications   Medication Sig Start Date End Date Taking? Authorizing Provider  famotidine (PEPCID) 20 MG tablet Take 1 tablet by mouth daily. 10/05/15  Yes Historical Provider, MD  ibuprofen (ADVIL,MOTRIN) 400 MG tablet Take 1 tablet by mouth every 6 (six) hours as  needed. 6-8 hours as needed 10/05/15  Yes Historical Provider, MD  metroNIDAZOLE (FLAGYL) 500 MG tablet Take 3 tablets by mouth 3 (three) times daily with meals. 07/24/15  Yes Historical Provider, MD  ondansetron (ZOFRAN-ODT) 4 MG disintegrating tablet Take 1 tablet by mouth every 6 (six) hours as needed. 07/24/15  Yes Historical Provider, MD  ALPRAZolam Duanne Moron) 1 MG tablet Take 1 tablet by mouth 3 (three) times daily.    Historical Provider, MD  amphetamine-dextroamphetamine (ADDERALL XR) 30 MG 24 hr capsule Take 1 capsule by mouth 2 (two) times daily.    Historical Provider, MD  bisoprolol-hydrochlorothiazide (ZIAC) 5-6.25 MG tablet Take 1 tablet by mouth daily.    Historical Provider, MD  cyclobenzaprine (FLEXERIL) 5 MG tablet Take 1 tablet (5 mg total) by mouth 3 (three) times daily as needed for muscle spasms. 03/03/16   Evalee Jefferson, PA-C  diclofenac (VOLTAREN) 75 MG EC tablet Take 1 tablet (75 mg total) by mouth 2 (two) times daily. 03/03/16   Evalee Jefferson, PA-C  gabapentin (NEURONTIN) 800 MG tablet Take 1 tablet by mouth at bedtime.    Historical Provider, MD  HYDROcodone-acetaminophen (NORCO/VICODIN) 5-325 MG tablet Take 1 tablet by mouth every 4 (four) hours as needed. 03/03/16   Evalee Jefferson, PA-C  Omega-3 Fatty Acids (FISH OIL) 1000 MG CAPS Take 1 capsule by mouth 2 (two) times daily.  Historical Provider, MD  PREMARIN 1.25 MG tablet TAKE (1) TABLET BY MOUTH ONCE DAILY. 01/26/15   Florian Buff, MD    Family History Family History  Problem Relation Age of Onset  . Stroke Father   . Heart failure Brother   . Stroke Mother     Social History Social History  Substance Use Topics  . Smoking status: Current Every Day Smoker    Packs/day: 0.50    Types: Cigarettes  . Smokeless tobacco: Never Used     Comment: trying to quit, slowing down  . Alcohol use No     Allergies   Bactrim [sulfamethoxazole-trimethoprim] and Codeine   Review of Systems Review of Systems  Constitutional:  Negative for fever.  Respiratory: Negative for shortness of breath.   Cardiovascular: Negative for chest pain and leg swelling.  Gastrointestinal: Negative for abdominal distention, abdominal pain and constipation.  Genitourinary: Negative for difficulty urinating, dysuria, flank pain, frequency and urgency.  Musculoskeletal: Positive for back pain. Negative for gait problem and joint swelling.  Skin: Negative for rash.  Neurological: Negative for weakness and numbness.     Physical Exam Updated Vital Signs BP 137/93 (BP Location: Left Arm)   Pulse 72   Temp 98.1 F (36.7 C) (Oral)   Resp 18   Ht 4\' 11"  (1.499 m)   Wt 74.8 kg   SpO2 100%   BMI 33.33 kg/m   Physical Exam  Constitutional: She is oriented to person, place, and time. She appears well-developed and well-nourished.  HENT:  Head: Normocephalic and atraumatic.  Eyes: Conjunctivae and EOM are normal. Pupils are equal, round, and reactive to light.  Neck: Normal range of motion. Neck supple.  Cardiovascular: Normal rate, regular rhythm and intact distal pulses.   Pedal pulses normal.  Pulmonary/Chest: Effort normal and breath sounds normal.  Abdominal: Soft. Bowel sounds are normal. She exhibits no distension and no mass.  Musculoskeletal: Normal range of motion. She exhibits no edema.       Lumbar back: She exhibits tenderness. She exhibits no swelling, no edema and no spasm.  Neurological: She is alert and oriented to person, place, and time. She has normal strength. She displays no atrophy and no tremor. No sensory deficit. Gait normal.  Reflex Scores:      Patellar reflexes are 2+ on the right side and 2+ on the left side.      Achilles reflexes are 2+ on the right side and 2+ on the left side. No strength deficit noted in hip and knee flexor and extensor muscle groups.  Ankle flexion and extension intact.  Skin: Skin is warm and dry.  Psychiatric: She has a normal mood and affect.  Nursing note and vitals  reviewed.    ED Treatments / Results  DIAGNOSTIC STUDIES: Oxygen Saturation is 100% on RA, normal by my interpretation.    COORDINATION OF CARE: 6:18 PM Discussed treatment plan with pt at bedside and pt agreed to plan.  Labs (all labs ordered are listed, but only abnormal results are displayed) Labs Reviewed - No data to display  EKG  EKG Interpretation None       Radiology Dg Lumbar Spine Complete  Result Date: 03/03/2016 CLINICAL DATA:  Low back pain.  No trauma. EXAM: LUMBAR SPINE - COMPLETE 4+ VIEW COMPARISON:  None. FINDINGS: Transitional anatomy at L5-S1 with assimilation joints. No fractures or malalignment. IMPRESSION: Negative. Electronically Signed   By: Dorise Bullion III M.D   On: 03/03/2016 19:10  Procedures Procedures (including critical care time)  Medications Ordered in ED Medications  ketorolac (TORADOL) injection 60 mg (60 mg Intramuscular Given 03/03/16 1846)     Initial Impression / Assessment and Plan / ED Course  I have reviewed the triage vital signs and the nursing notes.  Pertinent labs & imaging results that were available during my care of the patient were reviewed by me and considered in my medical decision making (see chart for details).  Clinical Course     Pt was given toradol injection here with some improvement in pain.  No neuro deficit on exam or by history to suggest emergent or surgical presentation.  Also discussed worsened sx that should prompt immediate re-evaluation including distal weakness, bowel/bladder retention/incontinence.     Final Clinical Impressions(s) / ED Diagnoses   Final diagnoses:  Sciatica of left side    New Prescriptions Discharge Medication List as of 03/03/2016  7:23 PM    START taking these medications   Details  cyclobenzaprine (FLEXERIL) 5 MG tablet Take 1 tablet (5 mg total) by mouth 3 (three) times daily as needed for muscle spasms., Starting Sun 03/03/2016, Print    diclofenac  (VOLTAREN) 75 MG EC tablet Take 1 tablet (75 mg total) by mouth 2 (two) times daily., Starting Sun 03/03/2016, Print       I personally performed the services described in this documentation, which was scribed in my presence. The recorded information has been reviewed and is accurate.    Evalee Jefferson, PA-C 03/03/16 [place], MD 03/05/16 [phone]

## 2016-03-04 ENCOUNTER — Other Ambulatory Visit: Payer: Self-pay | Admitting: Obstetrics & Gynecology

## 2016-04-18 ENCOUNTER — Emergency Department (HOSPITAL_COMMUNITY)
Admission: EM | Admit: 2016-04-18 | Discharge: 2016-04-18 | Disposition: A | Discharge status: Home / Self Care | Payer: Medicaid Other | Attending: Emergency Medicine | Admitting: Emergency Medicine

## 2016-04-18 ENCOUNTER — Encounter (HOSPITAL_COMMUNITY): Payer: Self-pay | Admitting: *Deleted

## 2016-04-18 ENCOUNTER — Emergency Department (HOSPITAL_COMMUNITY): Payer: Medicaid Other

## 2016-04-18 DIAGNOSIS — Z79899 Other long term (current) drug therapy: Secondary | ICD-10-CM | POA: Diagnosis not present

## 2016-04-18 DIAGNOSIS — J029 Acute pharyngitis, unspecified: Secondary | ICD-10-CM | POA: Diagnosis not present

## 2016-04-18 DIAGNOSIS — R509 Fever, unspecified: Secondary | ICD-10-CM | POA: Diagnosis not present

## 2016-04-18 DIAGNOSIS — M791 Myalgia: Secondary | ICD-10-CM | POA: Insufficient documentation

## 2016-04-18 DIAGNOSIS — R69 Illness, unspecified: Secondary | ICD-10-CM

## 2016-04-18 DIAGNOSIS — I10 Essential (primary) hypertension: Secondary | ICD-10-CM | POA: Diagnosis not present

## 2016-04-18 DIAGNOSIS — Z791 Long term (current) use of non-steroidal anti-inflammatories (NSAID): Secondary | ICD-10-CM | POA: Insufficient documentation

## 2016-04-18 DIAGNOSIS — F17210 Nicotine dependence, cigarettes, uncomplicated: Secondary | ICD-10-CM | POA: Insufficient documentation

## 2016-04-18 DIAGNOSIS — R05 Cough: Secondary | ICD-10-CM | POA: Diagnosis present

## 2016-04-18 DIAGNOSIS — J111 Influenza due to unidentified influenza virus with other respiratory manifestations: Secondary | ICD-10-CM

## 2016-04-18 MED ORDER — BENZONATATE 100 MG PO CAPS: 200.0000 mg | ORAL_CAPSULE | Freq: Two times a day (BID) | ORAL | 0 refills | Status: DC | PRN

## 2016-04-18 MED ORDER — KETOROLAC TROMETHAMINE 30 MG/ML IJ SOLN
30.0000 mg | Freq: Once | INTRAMUSCULAR | Status: AC
  Administered 2016-04-18: 30 mg via INTRAMUSCULAR
  Filled 2016-04-18: qty 1

## 2016-04-18 MED ORDER — OSELTAMIVIR PHOSPHATE 75 MG PO CAPS: 75.0000 mg | ORAL_CAPSULE | Freq: Two times a day (BID) | ORAL | 0 refills | Status: DC

## 2016-04-18 NOTE — ED Provider Notes (Signed)
[facility] DEPT Provider Note   CSN: [id number] Arrival date & time: 04/18/16  1710     History   Chief Complaint Chief Complaint  Patient presents with  . Cough    HPI Kara Matthews is a 41 y.o. female.  Patient presents to the emergency department with chief complaint of flulike symptoms. She reports that the symptoms began suddenly yesterday. She reports associated fevers, chills, body aches, cough, and sore throat. She has taken over-the-counter cough and cold medicines with little relief. She denies getting a flu shot this year. She denies any history of asthma, COPD, or diabetes. She is in everyday smoker, but states that she has not been smoking today or yesterday. There are no modifying factors.   The history is provided by the patient. No language interpreter was used.    Past Medical History:  Diagnosis Date  . Anxiety   . Essential hypertension   . Fibromyalgia   . Hyperlipidemia     There are no active problems to display for this patient.   Past Surgical History:  Procedure Laterality Date  . ABDOMINAL HYSTERECTOMY    . CESAREAN SECTION    . CHOLECYSTECTOMY      OB History    No data available       Home Medications    Prior to Admission medications   Medication Sig Start Date End Date Taking? Authorizing Provider  ALPRAZolam Duanne Moron) 1 MG tablet Take 1 tablet by mouth 3 (three) times daily.    Historical Provider, MD  amphetamine-dextroamphetamine (ADDERALL XR) 30 MG 24 hr capsule Take 1 capsule by mouth 2 (two) times daily.    Historical Provider, MD  bisoprolol-hydrochlorothiazide (ZIAC) 5-6.25 MG tablet Take 1 tablet by mouth daily.    Historical Provider, MD  cyclobenzaprine (FLEXERIL) 5 MG tablet Take 1 tablet (5 mg total) by mouth 3 (three) times daily as needed for muscle spasms. 03/03/16   Evalee Jefferson, PA-C  diclofenac (VOLTAREN) 75 MG EC tablet Take 1 tablet (75 mg total) by mouth 2 (two) times daily. 03/03/16   Evalee Jefferson, PA-C    famotidine (PEPCID) 20 MG tablet Take 1 tablet by mouth daily. 10/05/15   Historical Provider, MD  gabapentin (NEURONTIN) 800 MG tablet Take 1 tablet by mouth at bedtime.    Historical Provider, MD  HYDROcodone-acetaminophen (NORCO/VICODIN) 5-325 MG tablet Take 1 tablet by mouth every 4 (four) hours as needed. 03/03/16   Evalee Jefferson, PA-C  ibuprofen (ADVIL,MOTRIN) 400 MG tablet Take 1 tablet by mouth every 6 (six) hours as needed. 6-8 hours as needed 10/05/15   Historical Provider, MD  metroNIDAZOLE (FLAGYL) 500 MG tablet Take 3 tablets by mouth 3 (three) times daily with meals. 07/24/15   Historical Provider, MD  Omega-3 Fatty Acids (FISH OIL) 1000 MG CAPS Take 1 capsule by mouth 2 (two) times daily.    Historical Provider, MD  ondansetron (ZOFRAN-ODT) 4 MG disintegrating tablet Take 1 tablet by mouth every 6 (six) hours as needed. 07/24/15   Historical Provider, MD  PREMARIN 1.25 MG tablet TAKE (1) TABLET BY MOUTH ONCE DAILY. 03/05/16   Florian Buff, MD    Family History Family History  Problem Relation Age of Onset  . Stroke Father   . Heart failure Brother   . Stroke Mother     Social History Social History  Substance Use Topics  . Smoking status: Current Every Day Smoker    Packs/day: 0.50    Types: Cigarettes  . Smokeless tobacco:  Never Used     Comment: trying to quit, slowing down  . Alcohol use No     Allergies   Bactrim [sulfamethoxazole-trimethoprim]; Codeine; and Flagyl [metronidazole]   Review of Systems Review of Systems  All other systems reviewed and are negative.    Physical Exam Updated Vital Signs BP 133/81   Pulse 76   Temp 99.2 F (37.3 C) (Oral)   Resp 18   Ht 4\' 11"  (1.499 m)   Wt 74.8 kg   SpO2 98%   BMI 33.33 kg/m   Physical Exam Nursing note and vitals reviewed.  Constitutional: Appears well-developed and well-nourished. No distress.  HENT: Oropharynx is mildly erythematous, no tonsillar exudates or abscess, airway intact. Eyes:  Conjunctivae are normal. Pupils are equal, round, and reactive to light.  Neck: Normal range of motion and full passive range of motion without pain.  Cardiovascular: HR is 76, normal rhythm and intact distal pulses.   Pulmonary/Chest: Effort normal and breath sounds normal. No stridor. No wheezes, rhonchi, or rales. Abdominal: Soft. There is no focal abdominal tenderness.  Musculoskeletal: Normal range of motion. Moves all extremities. Neurological: Pt is alert and oriented to person, place, and time. Sensation and strength grossly intact throughout. Skin: Skin is warm and dry. No rash noted. Pt is not diaphoretic.  Psychiatric: Normal mood and affect.    ED Treatments / Results  Labs (all labs ordered are listed, but only abnormal results are displayed) Labs Reviewed - No data to display  EKG  EKG Interpretation None       Radiology Dg Chest 2 View  Result Date: 04/18/2016 CLINICAL DATA:  Dry cough, headache, nasal congestion EXAM: CHEST  2 VIEW COMPARISON:  10/05/2015 FINDINGS: The heart size and mediastinal contours are within normal limits. Both lungs are clear. The visualized skeletal structures are unremarkable. IMPRESSION: No active cardiopulmonary disease. Electronically Signed   By: Kathreen Devoid   On: 04/18/2016 19:11    Procedures Procedures (including critical care time)  Medications Ordered in ED Medications - No data to display   Initial Impression / Assessment and Plan / ED Course  I have reviewed the triage vital signs and the nursing notes.  Pertinent labs & imaging results that were available during my care of the patient were reviewed by me and considered in my medical decision making (see chart for details).     Patient with flulike symptoms. Patient is within the treatment window. Will prescribe Tamiflu. She has taken cough and cold medicine to control her fever. She appears well enough for outpatient treatment. Return precautions discussed.  Final  Clinical Impressions(s) / ED Diagnoses   Final diagnoses:  Influenza-like illness    New Prescriptions Discharge Medication List as of 04/18/2016  7:24 PM    START taking these medications   Details  benzonatate (TESSALON) 100 MG capsule Take 2 capsules (200 mg total) by mouth 2 (two) times daily as needed for cough., Starting Thu 04/18/2016, Print    oseltamivir (TAMIFLU) 75 MG capsule Take 1 capsule (75 mg total) by mouth every 12 (twelve) hours., Starting Thu 04/18/2016, Print         Montine Circle, PA-C 04/18/16 2019    Sherwood Gambler, MD 04/18/16 [phone]

## 2016-04-18 NOTE — ED Notes (Signed)
Report given to Yvette RN

## 2016-04-18 NOTE — ED Triage Notes (Signed)
Pt c/o dry cough, headache, nasal congestion, slight sore throat and chills that started yesterday.

## 2016-05-30 ENCOUNTER — Encounter: Payer: Self-pay | Admitting: Obstetrics and Gynecology

## 2016-05-30 ENCOUNTER — Ambulatory Visit (INDEPENDENT_AMBULATORY_CARE_PROVIDER_SITE_OTHER): Payer: Medicaid Other | Admitting: Obstetrics and Gynecology

## 2016-05-30 ENCOUNTER — Telehealth: Payer: Self-pay | Admitting: *Deleted

## 2016-05-30 ENCOUNTER — Other Ambulatory Visit: Payer: Self-pay | Admitting: Obstetrics and Gynecology

## 2016-05-30 VITALS — BP 120/78 | HR 66 | Ht 61.5 in | Wt 176.2 lb

## 2016-05-30 DIAGNOSIS — N83202 Unspecified ovarian cyst, left side: Secondary | ICD-10-CM

## 2016-05-30 DIAGNOSIS — Z1212 Encounter for screening for malignant neoplasm of rectum: Secondary | ICD-10-CM

## 2016-05-30 DIAGNOSIS — N644 Mastodynia: Secondary | ICD-10-CM

## 2016-05-30 LAB — HEMOCCULT GUIAC POC 1CARD (OFFICE): FECAL OCCULT BLD: NEGATIVE

## 2016-05-30 NOTE — Progress Notes (Signed)
   [facility] Visit  @DATE @            Patient name: QUINETTA SHILLING MRN [id number]  Date of birth: 1975-11-20  CC & HPI:  JADEA SHIFFER is a 41 y.o. female presenting today for  inconsistency of ease of voiding since her abdominal hysterectomy. Same with ease of having BMs. She also notes a painful knot to the underside of the left breast.   ROS:  ROS +inconsistency with ease of voiding  +inconsistency with ease of BMs +left breast pain  Pertinent History Reviewed:   Reviewed: Significant for abdominal hysterectomy  Medical         Past Medical History:  Diagnosis Date  . Anxiety   . Essential hypertension   . Fibromyalgia   . Heart murmur   . Hyperlipidemia                               Surgical Hx:    Past Surgical History:  Procedure Laterality Date  . ABDOMINAL HYSTERECTOMY    . CESAREAN SECTION    . CHOLECYSTECTOMY     Medications: Reviewed & Updated - see associated section                       Current Outpatient Prescriptions:  .  bisoprolol-hydrochlorothiazide (ZIAC) 5-6.25 MG tablet, Take 1 tablet by mouth daily., Disp: , Rfl:  .  cyclobenzaprine (FLEXERIL) 5 MG tablet, Take 1 tablet (5 mg total) by mouth 3 (three) times daily as needed for muscle spasms., Disp: 15 tablet, Rfl: 0 .  PREMARIN 1.25 MG tablet, TAKE (1) TABLET BY MOUTH ONCE DAILY., Disp: 30 tablet, Rfl: 11   Social History: Reviewed -  reports that she has been smoking Cigarettes.  She has a 10.00 pack-year smoking history. She has never used smokeless tobacco.  Objective Findings:  Vitals: Blood pressure 120/78, pulse 66, height 5' 1.5" (1.562 m), weight 176 lb 3.2 oz (79.9 kg).  Breast Exam: firm breast tissue on the underside of both breasts. Thickened tissue on left is slightly firmer and less mobile than compared to the right.  Gastrointestinal: Soft, non-tender, no masses or organomegaly Pelvic Exam: External genitalia: normal general appearance Vaginal: vaginal length is  about 9 cm Cervix: removed surgically Adnexa: vague sensation of a smooth mass on the left adnexa, ? 6 cm  Uterus: removed surgically Rectovaginal: normal rectal, no masses and guaiac negative stool obtained  Assessment & Plan:   A:  1. Left ovarian cyst, assymptomatic  2. Left breast pain   P:  1. Pelvic US 2. Diagnostic mammogram of left breast advised 3. Return 1 month    By signing my name below, I, Sonum Patel, attest that this documentation has been prepared under the direction and in the presence of Jonnie Kind, MD. Electronically Signed: Sonum Patel, Education administrator. 05/30/16. 11:46 AM.  I personally performed the services described in this documentation, which was SCRIBED in my presence. The recorded information has been reviewed and considered accurate. It has been edited as necessary during review. Jonnie Kind, MD

## 2016-05-30 NOTE — Telephone Encounter (Signed)
Informed patient of U/S appointment for 5/8 @ 1:20 but to there at 1:10. No lotion or powder or deo.

## 2016-06-17 ENCOUNTER — Other Ambulatory Visit: Payer: Self-pay | Admitting: Cardiology

## 2016-06-17 DIAGNOSIS — I350 Nonrheumatic aortic (valve) stenosis: Secondary | ICD-10-CM

## 2016-06-18 ENCOUNTER — Ambulatory Visit (HOSPITAL_COMMUNITY)
Admission: RE | Admit: 2016-06-18 | Discharge: 2016-06-18 | Disposition: A | Discharge status: Home / Self Care | Payer: Medicaid Other | Source: Ambulatory Visit | Attending: Obstetrics and Gynecology | Admitting: Obstetrics and Gynecology

## 2016-06-18 DIAGNOSIS — N644 Mastodynia: Secondary | ICD-10-CM | POA: Diagnosis present

## 2016-06-20 ENCOUNTER — Ambulatory Visit (INDEPENDENT_AMBULATORY_CARE_PROVIDER_SITE_OTHER): Payer: Medicaid Other

## 2016-06-20 ENCOUNTER — Other Ambulatory Visit: Payer: Self-pay

## 2016-06-20 DIAGNOSIS — I350 Nonrheumatic aortic (valve) stenosis: Secondary | ICD-10-CM

## 2016-06-21 ENCOUNTER — Telehealth: Payer: Self-pay | Admitting: *Deleted

## 2016-06-21 NOTE — Telephone Encounter (Signed)
-----   Message from Satira Sark, MD sent at 06/21/2016  9:28 AM EDT ----- Results reviewed. I reviewed the study images myself. Patient has a probable bicuspid aortic valve with evidence of mild aortic stenosis based on all images, and this would explain her heart murmur. I doubt that this is a symptom provoking issue, but we should see her back in one year for follow-up. A copy of this test should be forwarded to [facility].

## 2016-06-21 NOTE — Telephone Encounter (Signed)
Patient informed and verbalized understanding of plan. Copy sent to PCP 

## 2016-06-25 ENCOUNTER — Encounter (HOSPITAL_COMMUNITY): Payer: Medicaid Other

## 2016-06-28 ENCOUNTER — Other Ambulatory Visit: Payer: Self-pay | Admitting: Obstetrics and Gynecology

## 2016-06-28 DIAGNOSIS — N83202 Unspecified ovarian cyst, left side: Secondary | ICD-10-CM

## 2016-07-01 ENCOUNTER — Ambulatory Visit: Payer: Medicaid Other | Admitting: Obstetrics and Gynecology

## 2016-07-01 ENCOUNTER — Ambulatory Visit (INDEPENDENT_AMBULATORY_CARE_PROVIDER_SITE_OTHER): Payer: Medicaid Other

## 2016-07-01 DIAGNOSIS — N83202 Unspecified ovarian cyst, left side: Secondary | ICD-10-CM

## 2016-07-01 NOTE — Progress Notes (Signed)
PELVIC US TA/TV: normal vaginal cuff,no ovaries or adnexal masses visualized,no free fluid,no pain during ultrasound

## 2016-07-02 ENCOUNTER — Ambulatory Visit: Payer: Medicaid Other | Admitting: Obstetrics and Gynecology

## 2016-07-02 ENCOUNTER — Encounter: Payer: Self-pay | Admitting: Obstetrics and Gynecology

## 2016-07-02 VITALS — BP 120/80 | HR 80 | Wt 179.0 lb

## 2016-07-02 DIAGNOSIS — N3941 Urge incontinence: Secondary | ICD-10-CM | POA: Diagnosis not present

## 2016-07-02 DIAGNOSIS — F520 Hypoactive sexual desire disorder: Secondary | ICD-10-CM

## 2016-07-02 MED ORDER — EST ESTROGENS-METHYLTEST 1.25-2.5 MG PO TABS: 1.0000 | ORAL_TABLET | Freq: Every day | ORAL | 5 refills | Status: DC

## 2016-07-02 MED ORDER — EST ESTROGENS-METHYLTEST 1.25-2.5 MG PO TABS: 1.0000 | ORAL_TABLET | Freq: Every day | ORAL | Status: DC

## 2016-07-05 ENCOUNTER — Emergency Department (HOSPITAL_COMMUNITY): Payer: Medicaid Other

## 2016-07-05 ENCOUNTER — Emergency Department (HOSPITAL_COMMUNITY)
Admission: EM | Admit: 2016-07-05 | Discharge: 2016-07-05 | Disposition: A | Discharge status: Home / Self Care | Payer: Medicaid Other | Attending: Emergency Medicine | Admitting: Emergency Medicine

## 2016-07-05 ENCOUNTER — Encounter (HOSPITAL_COMMUNITY): Payer: Self-pay | Admitting: Emergency Medicine

## 2016-07-05 DIAGNOSIS — Z79899 Other long term (current) drug therapy: Secondary | ICD-10-CM | POA: Diagnosis not present

## 2016-07-05 DIAGNOSIS — I10 Essential (primary) hypertension: Secondary | ICD-10-CM | POA: Diagnosis not present

## 2016-07-05 DIAGNOSIS — Y92410 Unspecified street and highway as the place of occurrence of the external cause: Secondary | ICD-10-CM | POA: Insufficient documentation

## 2016-07-05 DIAGNOSIS — S8002XA Contusion of left knee, initial encounter: Secondary | ICD-10-CM | POA: Insufficient documentation

## 2016-07-05 DIAGNOSIS — S59902A Unspecified injury of left elbow, initial encounter: Secondary | ICD-10-CM | POA: Diagnosis present

## 2016-07-05 DIAGNOSIS — F17210 Nicotine dependence, cigarettes, uncomplicated: Secondary | ICD-10-CM | POA: Insufficient documentation

## 2016-07-05 DIAGNOSIS — Y9389 Activity, other specified: Secondary | ICD-10-CM | POA: Insufficient documentation

## 2016-07-05 DIAGNOSIS — S5002XA Contusion of left elbow, initial encounter: Secondary | ICD-10-CM | POA: Diagnosis not present

## 2016-07-05 DIAGNOSIS — Y999 Unspecified external cause status: Secondary | ICD-10-CM | POA: Diagnosis not present

## 2016-07-05 MED ORDER — IBUPROFEN 800 MG PO TABS: 800.0000 mg | ORAL_TABLET | Freq: Three times a day (TID) | ORAL | 0 refills | Status: DC

## 2016-07-05 MED ORDER — METHOCARBAMOL 500 MG PO TABS: 500.0000 mg | ORAL_TABLET | Freq: Two times a day (BID) | ORAL | 0 refills | Status: DC
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
## 2016-07-05 NOTE — Discharge Instructions (Signed)
Return if any problems.  See your Physician if pain persist past one week

## 2016-07-05 NOTE — Progress Notes (Deleted)
Cardiology Office Note  Date: 07/05/2016   ID: QUINA WILBOURNE, DOB 18-Feb-1976, MRN [id number]  PCP: [facility]  Primary Cardiologist: Rozann Lesches, MD   No chief complaint on file.   History of Present Illness: Kara Matthews is a 41 y.o. female seen in consultation back in April 2017.  Recent follow-up echocardiogram is outlined below. I reviewed the images and she has a probable bicuspid aortic valve with overall mild aortic stenosis.  Past Medical History:  Diagnosis Date  . Anxiety   . Essential hypertension   . Fibromyalgia   . Heart murmur   . Hyperlipidemia     Past Surgical History:  Procedure Laterality Date  . ABDOMINAL HYSTERECTOMY    . CESAREAN SECTION    . CHOLECYSTECTOMY      Current Outpatient Prescriptions  Medication Sig Dispense Refill  . bisoprolol-hydrochlorothiazide (ZIAC) 5-6.25 MG tablet Take 1 tablet by mouth daily.    Marland Kitchen estrogens-methylTEST (ESTRATEST) 1.25-2.5 MG tablet Take 1 tablet by mouth daily. 30 tablet 5   No current facility-administered medications for this visit.    Allergies:  Bactrim [sulfamethoxazole-trimethoprim]; Ciprofloxacin; Codeine; and Flagyl [metronidazole]   Social History: The patient  reports that she has been smoking Cigarettes.  She has a 10.00 pack-year smoking history. She has never used smokeless tobacco. She reports that she does not drink alcohol or use drugs.   Family History: The patient's family history includes Heart failure in her brother; Stroke in her father and mother.   ROS:  Please see the history of present illness. Otherwise, complete review of systems is positive for {NONE DEFAULTED:18576::"none"}.  All other systems are reviewed and negative.   Physical Exam: VS:  There were no vitals taken for this visit., BMI There is no height or weight on file to calculate BMI.  Wt Readings from Last 3 Encounters:  07/02/16 179 lb (81.2 kg)  05/30/16 176 lb 3.2 oz (79.9 kg)    04/18/16 165 lb (74.8 kg)    General: Short statured, overweight woman, appears comfortable at rest. HEENT: Conjunctiva and lids normal, oropharynx clear. Neck: Supple, no elevated JVP or carotid bruits, no thyromegaly. Lungs: Clear to auscultation, nonlabored breathing at rest. Cardiac: Regular rate and rhythm, no S3, 2/6 basal systolic murmur radiating up to the neck, no pericardial rub. Abdomen: Soft, nontender, bowel sounds present, no guarding or rebound. Extremities: No pitting edema, distal pulses 2+. Skin: Warm and dry. Multiple tattoos. Musculoskeletal: No kyphosis. Neuropsychiatric: Alert and oriented x3, affect grossly appropriate.  ECG: I personally reviewed the tracing from 11/22/2013 which showed sinus rhythm with possible left atrial enlargement.  Recent Labwork:  April 2017: Hemoglobin 13.7, platelets 199, potassium 4.3, BUN 12, creatinine 0.13, AST 34, ALT 26, cholesterol 148, triglycerides 450, HDL 22, LDL not calculated  Other Studies Reviewed Today:  Echocardiogram 06/20/2016: Study Conclusions  - Left ventricle: The cavity size was normal. Wall thickness was   normal. Systolic function was vigorous. The estimated ejection   fraction was in the range of 65% to 70%. Wall motion was normal;   there were no regional wall motion abnormalities. Left   ventricular diastolic function parameters were normal. - Aortic valve: There was mild to moderate stenosis. There was mild   regurgitation. Mean gradient (S): 15 mm Hg. Valve area (VTI):   1.39 cm^2. Valve area (Vmax): 1.33 cm^2. Valve area (Vmean): 1.3   cm^2. - Mitral valve: There was mild regurgitation. - Atrial septum: No defect or patent  foramen ovale was identified. - Systemic veins: IVC is small, suggesting low RA pressure and   hypovolemia. - Technically adequate study.  Assessment and Plan:    Current medicines were reviewed with the patient today.  No orders of the defined types were placed in this  encounter.   Disposition:  Signed, Satira Sark, MD, [facility] 07/05/2016 12:00 PM    [facility] [place], [place], [place] [address] Phone: [phone]; Fax: [phone]
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
## 2016-07-05 NOTE — ED Notes (Signed)
Pt ambulatory to waiting room. Pt verbalized understanding of discharge instructions.   

## 2016-07-07 NOTE — Progress Notes (Signed)
   [facility] Visit  @DATE @            Patient name: Kara Matthews MRN [id number]  Date of birth: June 16, 1975  CC & HPI:  Kara Matthews is a 41 y.o. female presenting today for 1. Follow-up of breast pain, status post mammogram 06/18/2016. 2. Hot flashes and urinary urgency and frequency, and reduce sexual desire  ROS:  ROS patient complains of hot flashes despite being on Premarin 1.25 mg daily she complains of urinary urgency and frequency with nocturia 3 she also has constipation and diminished appetite with quick food satiety   Pertinent History Reviewed:   Reviewed: Significant for . Medical         Past Medical History:  Diagnosis Date  . Anxiety   . Essential hypertension   . Fibromyalgia   . Heart murmur   . Hyperlipidemia                               Surgical Hx:    Past Surgical History:  Procedure Laterality Date  . ABDOMINAL HYSTERECTOMY    . CESAREAN SECTION    . CHOLECYSTECTOMY     Medications: Reviewed & Updated - see associated section                       Current Outpatient Prescriptions:  .  bisoprolol-hydrochlorothiazide (ZIAC) 5-6.25 MG tablet, Take 1 tablet by mouth daily., Disp: , Rfl:  .  estrogens-methylTEST (ESTRATEST) 1.25-2.5 MG tablet, Take 1 tablet by mouth daily., Disp: 30 tablet, Rfl: 5 .  ibuprofen (ADVIL,MOTRIN) 800 MG tablet, Take 1 tablet (800 mg total) by mouth 3 (three) times daily., Disp: 21 tablet, Rfl: 0 .  methocarbamol (ROBAXIN) 500 MG tablet, Take 1 tablet (500 mg total) by mouth 2 (two) times daily., Disp: 20 tablet, Rfl: 0   Social History: Reviewed -  reports that she has been smoking Cigarettes.  She has a 10.00 pack-year smoking history. She has never used smokeless tobacco.  Objective Findings:  Vitals: Blood pressure 120/80, pulse 80, weight 179 lb (81.2 kg).  Physical Examination: Lymphatics - no palpable lymphadenopathy, no hepatosplenomegaly, moderate nontender anterior cervical nodes Abdomen - soft,  nontender, nondistended, no masses or organomegaly Breasts - breasts appear normal, no suspicious masses, no skin or nipple changes or axillary nodes Pelvic - VULVA: normal appearing vulva with no masses, tenderness or lesions, VAGINA: normal appearing vagina with normal color and discharge, no lesions, CERVIX: Surgically absent x without discharge or lesions, UTERUS: uterus is surgically absent, consistency and nontender, ADNEXA: normal adnexa in size, nontender and no masses   Assessment & Plan:   A:  1. Vasomotor symptoms urinary urgency and vasomotor symptoms including sexual dysfunction after surgical removal of ovaries and uterus  P:  1. Will send prescription for Estratest, estrogen 1.25 mg plus testosterone to pharmacy 2. Trial ditropan 5 mg at bedtime  follow-up 1 months GYN 3. Follow-up appointments GYN

## 2016-07-08 ENCOUNTER — Ambulatory Visit: Payer: Medicaid Other | Admitting: Cardiology

## 2016-07-08 ENCOUNTER — Telehealth: Payer: Self-pay | Admitting: Cardiology

## 2016-07-08 NOTE — Telephone Encounter (Signed)
Patient would like to know if follow up is necessary since test was good

## 2016-07-08 NOTE — Telephone Encounter (Signed)
Per Dr. Chuck Hint recommendation, patient needs to continue with 1 year follow up. Patient verbalized understanding

## 2016-08-02 ENCOUNTER — Encounter: Payer: Self-pay | Admitting: Cardiology

## 2016-08-02 ENCOUNTER — Ambulatory Visit (INDEPENDENT_AMBULATORY_CARE_PROVIDER_SITE_OTHER): Payer: Medicaid Other | Admitting: Cardiology

## 2016-08-02 ENCOUNTER — Telehealth: Payer: Self-pay | Admitting: Cardiology

## 2016-08-02 ENCOUNTER — Ambulatory Visit: Payer: Medicaid Other | Admitting: Obstetrics and Gynecology

## 2016-08-02 VITALS — BP 122/80 | HR 66 | Ht 60.0 in | Wt 179.4 lb

## 2016-08-02 DIAGNOSIS — E782 Mixed hyperlipidemia: Secondary | ICD-10-CM | POA: Diagnosis not present

## 2016-08-02 DIAGNOSIS — I350 Nonrheumatic aortic (valve) stenosis: Secondary | ICD-10-CM

## 2016-08-02 DIAGNOSIS — Z720 Tobacco use: Secondary | ICD-10-CM | POA: Diagnosis not present

## 2016-08-02 DIAGNOSIS — I10 Essential (primary) hypertension: Secondary | ICD-10-CM | POA: Diagnosis not present

## 2016-08-02 NOTE — Patient Instructions (Signed)
Medication Instructions:  Your physician recommends that you continue on your current medications as directed. Please refer to the Current Medication list given to you today.  Labwork: NONE  Testing/Procedures: Your physician has requested that you have an echocardiogram. Echocardiography is a painless test that uses sound waves to create images of your heart. It provides your doctor with information about the size and shape of your heart and how well your heart's chambers and valves are working. This procedure takes approximately one hour. There are no restrictions for this procedure.  Follow-Up: Your physician wants you to follow-up in: 1 YEAR WITH DR. MCDOWELL. You will receive a reminder letter in the mail two months in advance. If you don't receive a letter, please call our office to schedule the follow-up appointment.  Any Other Special Instructions Will Be Listed Below (If Applicable).  If you need a refill on your cardiac medications before your next appointment, please call your pharmacy. 

## 2016-08-02 NOTE — Progress Notes (Signed)
Cardiology Office Note  Date: 08/02/2016   ID: Kara Matthews, DOB 03-27-1975, MRN [id number]  PCP: [facility]  Primary Cardiologist: Rozann Lesches, MD   Chief Complaint  Patient presents with  . Aortic Stenosis    History of Present Illness: Kara Matthews is a 41 y.o. female that I met in April of last year. She presents for a routine follow-up visit. No major change in stamina or new symptoms since last encounter. Today we discussed the results of her recent follow-up echocardiogram done in May. She has a probable bicuspid aortic valve with overall mild to moderate aortic stenosis.  I personally reviewed her ECG today which shows normal sinus rhythm.  She had recent lab work in April, triglycerides were 450. We discussed her diet, I recommended reduction in carbohydrates, also considering omega-3 supplements beginning at 1000 mg twice daily.  Past Medical History:  Diagnosis Date  . Anxiety   . Aortic stenosis    Probable bicuspid aortic valve  . Essential hypertension   . Fibromyalgia   . Hyperlipidemia     Past Surgical History:  Procedure Laterality Date  . ABDOMINAL HYSTERECTOMY    . CESAREAN SECTION    . CHOLECYSTECTOMY      Current Outpatient Prescriptions  Medication Sig Dispense Refill  . Aspirin-Salicylamide-Caffeine (BC HEADACHE POWDER PO) Take by mouth as needed.    . bisoprolol-hydrochlorothiazide (ZIAC) 5-6.25 MG tablet Take 1 tablet by mouth daily.    Marland Kitchen estrogens-methylTEST (ESTRATEST) 1.25-2.5 MG tablet Take 1 tablet by mouth daily. 30 tablet 5  . ibuprofen (ADVIL,MOTRIN) 800 MG tablet Take 1 tablet (800 mg total) by mouth 3 (three) times daily. (Patient taking differently: Take 800 mg by mouth as needed. ) 21 tablet 0   No current facility-administered medications for this visit.    Allergies:  Bactrim [sulfamethoxazole-trimethoprim]; Ciprofloxacin; Codeine; and Flagyl [metronidazole]   Social History: The patient   reports that she has been smoking Cigarettes.  She has a 10.00 pack-year smoking history. She has never used smokeless tobacco. She reports that she does not drink alcohol or use drugs.   ROS:  Please see the history of present illness. Otherwise, complete review of systems is positive for none.  All other systems are reviewed and negative.   Physical Exam: VS:  BP 122/80   Pulse 66   Ht 5' (1.524 m)   Wt 179 lb 6.4 oz (81.4 kg)   SpO2 97%   BMI 35.04 kg/m , BMI Body mass index is 35.04 kg/m.  Wt Readings from Last 3 Encounters:  08/02/16 179 lb 6.4 oz (81.4 kg)  07/05/16 179 lb (81.2 kg)  07/02/16 179 lb (81.2 kg)    General: Overweight woman, appears comfortable at rest. HEENT: Conjunctiva and lids normal, oropharynx clear. Neck: Supple, no elevated JVP or carotid bruits, no thyromegaly. Lungs: Clear to auscultation, nonlabored breathing at rest. Cardiac: Regular rate and rhythm, no S3, 2/6 basal systolic murmur radiating up to the neck, no pericardial rub. Abdomen: Soft, nontender, bowel sounds present, no guarding or rebound. Extremities: No pitting edema, distal pulses 2+. Skin: Warm and dry. Multiple tattoos. Musculoskeletal: No kyphosis. Neuropsychiatric: Alert and oriented x3, affect grossly appropriate.  ECG: I personally reviewed the tracing from 11/22/2013 which showed sinus rhythm with possible left atrial enlargement.  Recent Labwork:  April 2018: Hemoglobin 13.7, potassium 4.3, BUN 12, creatinine 0.53, AST 34, ALT 26, cholesterol 148, triglycerides 450, HDL 22, LDL not calculated   Other Studies  Reviewed Today:  Echocardiogram 06/20/2016: Study Conclusions  - Left ventricle: The cavity size was normal. Wall thickness was   normal. Systolic function was vigorous. The estimated ejection   fraction was in the range of 65% to 70%. Wall motion was normal;   there were no regional wall motion abnormalities. Left   ventricular diastolic function parameters were  normal. - Aortic valve: There was mild to moderate stenosis. There was mild   regurgitation. Mean gradient (S): 15 mm Hg. Valve area (VTI):   1.39 cm^2. Valve area (Vmax): 1.33 cm^2. Valve area (Vmean): 1.3   cm^2. - Mitral valve: There was mild regurgitation. - Atrial septum: No defect or patent foramen ovale was identified. - Systemic veins: IVC is small, suggesting low RA pressure and   hypovolemia. - Technically adequate study.  Assessment and Plan:  1. Mild to moderate aortic stenosis with probable bicuspid aortic valve. Aortic root size is normal. She is asymptomatic and we will continue to follow on a yearly basis at this point. We did discuss the natural history of aortic stenosis.  2. Hypertriglyceridemia. We discussed diet and carbohydrate reduction. She will get omega-3 supplements 1000 twice daily over-the-counter.  3. Essential hypertension, on Ziac. Blood pressure is well controlled today.  4. Tobacco abuse, smoking cessation discussed.  Current medicines were reviewed with the patient today.   Orders Placed This Encounter  Procedures  . EKG 12-Lead  . ECHOCARDIOGRAM COMPLETE    Disposition: Follow-up in one year.  Signed, Satira Sark, MD, [facility] 08/02/2016 3:58 PM    [facility] [place], [place], [place] [address] Phone: [phone]; Fax: [phone]

## 2016-08-02 NOTE — Telephone Encounter (Signed)
Echo- aortic stenosis in 1 year Schedule in [facility] August 06, 2017

## 2016-11-17 ENCOUNTER — Emergency Department (HOSPITAL_COMMUNITY): Payer: Medicaid Other

## 2016-11-17 ENCOUNTER — Emergency Department (HOSPITAL_COMMUNITY)
Admission: EM | Admit: 2016-11-17 | Discharge: 2016-11-17 | Disposition: A | Discharge status: Home / Self Care | Payer: Medicaid Other | Attending: Emergency Medicine | Admitting: Emergency Medicine

## 2016-11-17 ENCOUNTER — Encounter (HOSPITAL_COMMUNITY): Payer: Self-pay

## 2016-11-17 DIAGNOSIS — F17210 Nicotine dependence, cigarettes, uncomplicated: Secondary | ICD-10-CM | POA: Insufficient documentation

## 2016-11-17 DIAGNOSIS — Z79899 Other long term (current) drug therapy: Secondary | ICD-10-CM | POA: Diagnosis not present

## 2016-11-17 DIAGNOSIS — I10 Essential (primary) hypertension: Secondary | ICD-10-CM | POA: Insufficient documentation

## 2016-11-17 DIAGNOSIS — J018 Other acute sinusitis: Secondary | ICD-10-CM

## 2016-11-17 DIAGNOSIS — J40 Bronchitis, not specified as acute or chronic: Secondary | ICD-10-CM | POA: Diagnosis not present

## 2016-11-17 DIAGNOSIS — R05 Cough: Secondary | ICD-10-CM | POA: Diagnosis present

## 2016-11-17 MED ORDER — CEFTRIAXONE SODIUM 1 G IJ SOLR
1.0000 g | Freq: Once | INTRAMUSCULAR | Status: AC
  Administered 2016-11-17: 1 g via INTRAMUSCULAR
  Filled 2016-11-17: qty 10

## 2016-11-17 MED ORDER — LORATADINE-PSEUDOEPHEDRINE ER 5-120 MG PO TB12: 1.0000 | ORAL_TABLET | Freq: Two times a day (BID) | ORAL | 0 refills | Status: DC

## 2016-11-17 MED ORDER — AZITHROMYCIN 250 MG PO TABS: ORAL_TABLET | ORAL | 0 refills | Status: DC

## 2016-11-17 MED ORDER — AZITHROMYCIN 250 MG PO TABS
500.0000 mg | ORAL_TABLET | Freq: Once | ORAL | Status: AC
  Administered 2016-11-17: 500 mg via ORAL
  Filled 2016-11-17: qty 2

## 2016-11-17 MED ORDER — PROMETHAZINE-DM 6.25-15 MG/5ML PO SYRP: ORAL_SOLUTION | ORAL | 0 refills | Status: DC

## 2016-11-17 MED ORDER — ONDANSETRON HCL 4 MG PO TABS
4.0000 mg | ORAL_TABLET | Freq: Once | ORAL | Status: AC
  Administered 2016-11-17: 4 mg via ORAL
  Filled 2016-11-17: qty 1

## 2016-11-17 MED ORDER — ALBUTEROL SULFATE HFA 108 (90 BASE) MCG/ACT IN AERS
2.0000 | INHALATION_SPRAY | Freq: Once | RESPIRATORY_TRACT | Status: AC
  Administered 2016-11-17: 2 via RESPIRATORY_TRACT
  Filled 2016-11-17: qty 6.7

## 2016-11-17 MED ORDER — PSEUDOEPHEDRINE HCL 60 MG PO TABS
60.0000 mg | ORAL_TABLET | Freq: Once | ORAL | Status: AC
  Administered 2016-11-17: 60 mg via ORAL
  Filled 2016-11-17: qty 1

## 2016-11-17 MED ORDER — PREDNISONE 50 MG PO TABS
60.0000 mg | ORAL_TABLET | Freq: Once | ORAL | Status: AC
  Administered 2016-11-17: 60 mg via ORAL
  Filled 2016-11-17: qty 1

## 2016-11-17 MED ORDER — DEXAMETHASONE 4 MG PO TABS: 4.0000 mg | ORAL_TABLET | Freq: Two times a day (BID) | ORAL | 0 refills | Status: DC

## 2016-11-17 NOTE — ED Provider Notes (Signed)
[place] DEPT Provider Note   CSN: [id number] Arrival date & time: 11/17/16  1653     History   Chief Complaint Chief Complaint  Patient presents with  . Cough    HPI Kara Matthews is a 41 y.o. female.  The history is provided by the patient.  Cough  This is a new problem. The current episode started more than 1 week ago. The problem occurs hourly. The problem has been gradually worsening. The cough is productive of blood-tinged sputum. Maximum temperature: subjective fever. Associated symptoms include chills, sore throat and myalgias. Pertinent negatives include no chest pain, no shortness of breath and no wheezing. Treatments tried: OTC cough medication. She is a smoker. Her past medical history is significant for bronchitis.    Past Medical History:  Diagnosis Date  . Anxiety   . Aortic stenosis    Probable bicuspid aortic valve  . Essential hypertension   . Fibromyalgia   . Hyperlipidemia     Patient Active Problem List   Diagnosis Date Noted  . Lack of sexual desire 07/02/2016    Past Surgical History:  Procedure Laterality Date  . ABDOMINAL HYSTERECTOMY    . CESAREAN SECTION    . CHOLECYSTECTOMY      OB History    Gravida Para Term Preterm AB Living   2 2 2     2    SAB TAB Ectopic Multiple Live Births           2       Home Medications    Prior to Admission medications   Medication Sig Start Date End Date Taking? Authorizing Provider  Aspirin-Salicylamide-Caffeine (BC HEADACHE POWDER PO) Take by mouth as needed.    [provider]  bisoprolol-hydrochlorothiazide (ZIAC) 5-6.25 MG tablet Take 1 tablet by mouth daily.    [provider]  estrogens-methylTEST (ESTRATEST) 1.25-2.5 MG tablet Take 1 tablet by mouth daily. 07/02/16   Jonnie Kind, MD  ibuprofen (ADVIL,MOTRIN) 800 MG tablet Take 1 tablet (800 mg total) by mouth 3 (three) times daily. Patient taking differently: Take 800 mg by mouth as needed.  07/05/16   Fransico Meadow, PA-C    Family History Family History  Problem Relation Age of Onset  . Stroke Father   . Heart failure Brother   . Stroke Mother     Social History Social History  Substance Use Topics  . Smoking status: Current Every Day Smoker    Packs/day: 0.50    Years: 20.00    Types: Cigarettes  . Smokeless tobacco: Never Used     Comment: trying to quit, slowing down  . Alcohol use No     Allergies   Bactrim [sulfamethoxazole-trimethoprim]; Ciprofloxacin; Codeine; and Flagyl [metronidazole]   Review of Systems Review of Systems  Constitutional: Positive for chills, fatigue and fever. Negative for activity change.       All ROS Neg except as noted in HPI  HENT: Positive for congestion, sinus pressure and sore throat. Negative for nosebleeds.   Eyes: Negative for photophobia and discharge.  Respiratory: Positive for cough. Negative for shortness of breath and wheezing.   Cardiovascular: Negative for chest pain and palpitations.  Gastrointestinal: Negative for abdominal pain and blood in stool.  Genitourinary: Negative for dysuria, frequency and hematuria.  Musculoskeletal: Positive for myalgias. Negative for arthralgias, back pain and neck pain.  Skin: Negative.   Neurological: Negative for dizziness, seizures and speech difficulty.  Psychiatric/Behavioral: Negative for confusion and hallucinations.  Physical Exam Updated Vital Signs BP 116/78 (BP Location: Right Arm)   Pulse 91   Temp 98.5 F (36.9 C) (Oral)   Resp 19   Ht 4\' 11"  (1.499 m)   Wt 74.8 kg (165 lb)   SpO2 98%   BMI 33.33 kg/m   Physical Exam  Constitutional: She is oriented to person, place, and time. She appears well-developed and well-nourished.  Non-toxic appearance.  HENT:  Head: Normocephalic.  Right Ear: Tympanic membrane and external ear normal.  Left Ear: Tympanic membrane and external ear normal.  Nasal congestion present. Mild discomfort to percussion of the sinuses.  Eyes:  Pupils are equal, round, and reactive to light. EOM and lids are normal.  Neck: Normal range of motion. Neck supple. No JVD present. Carotid bruit is not present. No tracheal deviation present.  Cardiovascular: Normal rate, regular rhythm, normal heart sounds, intact distal pulses and normal pulses.   Pulmonary/Chest: Effort normal. No stridor. No respiratory distress. She has wheezes.  Abdominal: Soft. Bowel sounds are normal. There is no tenderness. There is no guarding.  Musculoskeletal: Normal range of motion.  Lymphadenopathy:       Head (right side): No submandibular adenopathy present.       Head (left side): No submandibular adenopathy present.    She has no cervical adenopathy.  Neurological: She is alert and oriented to person, place, and time. She has normal strength. No cranial nerve deficit or sensory deficit.  Skin: Skin is warm and dry. No rash noted.  Psychiatric: She has a normal mood and affect. Her speech is normal.  Nursing note and vitals reviewed.    ED Treatments / Results  Labs (all labs ordered are listed, but only abnormal results are displayed) Labs Reviewed - No data to display  EKG  EKG Interpretation None       Radiology Dg Chest 2 View  Result Date: 11/17/2016 CLINICAL DATA:  Productive cough. Chest pain. Fever. Myalgias for the past 3 days. Smoker. EXAM: CHEST  2 VIEW COMPARISON:  04/18/2016. FINDINGS: Normal sized heart. Clear lungs. Stable mild diffuse peribronchial thickening. Unremarkable bones. Cholecystectomy clips. IMPRESSION: No acute abnormality.  Stable mild chronic bronchitic changes. Electronically Signed   By: Claudie Revering M.D.   On: 11/17/2016 18:08    Procedures Procedures (including critical care time)  Medications Ordered in ED Medications - No data to display   Initial Impression / Assessment and Plan / ED Course  I have reviewed the triage vital signs and the nursing notes.  Pertinent labs & imaging results that were  available during my care of the patient were reviewed by me and considered in my medical decision making (see chart for details).       Final Clinical Impressions(s) / ED Diagnoses MDM Vital signs within normal limits. Pulse oximetry is 98% on room air. Within normal limits by my interpretation.  The examination favors sinusitis and bronchitis. Patient is a smoker. Patient will be treated with azithromycin, steroids, inhaler, and cough medication. Pt [facility] for follow up and recheck.   Final diagnoses:  Bronchitis  Other acute sinusitis, recurrence not specified    New Prescriptions New Prescriptions   AZITHROMYCIN (ZITHROMAX) 250 MG TABLET    Take one po daily with food starting 11/18/16.   DEXAMETHASONE (DECADRON) 4 MG TABLET    Take 1 tablet (4 mg total) by mouth 2 (two) times daily with a meal.   LORATADINE-PSEUDOEPHEDRINE (CLARITIN-D 12 HOUR) 5-120 MG  TABLET    Take 1 tablet by mouth 2 (two) times daily.   PROMETHAZINE-DEXTROMETHORPHAN (PROMETHAZINE-DM) 6.25-15 MG/5ML SYRUP    5 or 82ml po q6h prn cough     Lily Kocher, PA-C 11/17/16 Yevette Edwards    Lajean Saver, MD 11/17/16 2115

## 2016-11-17 NOTE — ED Triage Notes (Signed)
Reports of cough, chest congestion and body aches x1 week. Denies fevers.

## 2016-11-17 NOTE — ED Notes (Signed)
Pt alert & oriented x4, stable gait. Patient given discharge instructions, paperwork & prescription(s). Patient  instructed to stop at the registration desk to finish any additional paperwork. Patient verbalized understanding. Pt left department w/ no further questions. 

## 2016-11-17 NOTE — Discharge Instructions (Signed)
Please increase fluids. Please wash hands frequently. Use albuterol every 4 hours. Use Decadron and Claritin-D 2 times daily for congestion. Use promethazine DM for cough.

## 2016-12-24 ENCOUNTER — Encounter (HOSPITAL_COMMUNITY): Payer: Self-pay | Admitting: *Deleted

## 2016-12-24 ENCOUNTER — Emergency Department (HOSPITAL_COMMUNITY)
Admission: EM | Admit: 2016-12-24 | Discharge: 2016-12-24 | Disposition: A | Discharge status: Home / Self Care | Payer: Medicaid Other | Attending: Emergency Medicine | Admitting: Emergency Medicine

## 2016-12-24 ENCOUNTER — Other Ambulatory Visit: Payer: Self-pay

## 2016-12-24 ENCOUNTER — Emergency Department (HOSPITAL_COMMUNITY): Payer: Medicaid Other

## 2016-12-24 DIAGNOSIS — Z881 Allergy status to other antibiotic agents status: Secondary | ICD-10-CM | POA: Insufficient documentation

## 2016-12-24 DIAGNOSIS — Z79818 Long term (current) use of other agents affecting estrogen receptors and estrogen levels: Secondary | ICD-10-CM | POA: Insufficient documentation

## 2016-12-24 DIAGNOSIS — I10 Essential (primary) hypertension: Secondary | ICD-10-CM | POA: Insufficient documentation

## 2016-12-24 DIAGNOSIS — Z882 Allergy status to sulfonamides status: Secondary | ICD-10-CM | POA: Diagnosis not present

## 2016-12-24 DIAGNOSIS — Z79899 Other long term (current) drug therapy: Secondary | ICD-10-CM | POA: Diagnosis not present

## 2016-12-24 DIAGNOSIS — Z885 Allergy status to narcotic agent status: Secondary | ICD-10-CM | POA: Insufficient documentation

## 2016-12-24 DIAGNOSIS — R200 Anesthesia of skin: Secondary | ICD-10-CM | POA: Diagnosis not present

## 2016-12-24 DIAGNOSIS — R110 Nausea: Secondary | ICD-10-CM | POA: Insufficient documentation

## 2016-12-24 DIAGNOSIS — F17210 Nicotine dependence, cigarettes, uncomplicated: Secondary | ICD-10-CM | POA: Insufficient documentation

## 2016-12-24 DIAGNOSIS — R0600 Dyspnea, unspecified: Secondary | ICD-10-CM | POA: Diagnosis not present

## 2016-12-24 DIAGNOSIS — R079 Chest pain, unspecified: Secondary | ICD-10-CM | POA: Diagnosis present

## 2016-12-24 LAB — COMPREHENSIVE METABOLIC PANEL
ALK PHOS: 67 U/L (ref 38–126)
ALT: 34 U/L (ref 14–54)
ANION GAP: 12 (ref 5–15)
AST: 47 U/L — ABNORMAL HIGH (ref 15–41)
Albumin: 3.9 g/dL (ref 3.5–5.0)
BILIRUBIN TOTAL: 0.4 mg/dL (ref 0.3–1.2)
BUN: 10 mg/dL (ref 6–20)
CO2: 23 mmol/L (ref 22–32)
Calcium: 9.1 mg/dL (ref 8.9–10.3)
Chloride: 105 mmol/L (ref 101–111)
Creatinine, Ser: 0.72 mg/dL (ref 0.44–1.00)
GFR calc non Af Amer: >60 mL/min (ref >60)
Glucose, Bld: 96 mg/dL (ref 65–99)
Potassium: 3.8 mmol/L (ref 3.5–5.1)
SODIUM: 140 mmol/L (ref 135–145)
TOTAL PROTEIN: 6.8 g/dL (ref 6.5–8.1)

## 2016-12-24 LAB — CBC WITH DIFFERENTIAL/PLATELET
Basophils Absolute: 0.1 10*3/uL (ref 0.0–0.1)
Basophils Relative: 1 %
EOS ABS: 0.2 10*3/uL (ref 0.0–0.7)
Eosinophils Relative: 3 %
HEMATOCRIT: 39.9 % (ref 36.0–46.0)
HEMOGLOBIN: 13.6 g/dL (ref 12.0–15.0)
LYMPHS ABS: 3 10*3/uL (ref 0.7–4.0)
Lymphocytes Relative: 42 %
MCH: 31.9 pg (ref 26.0–34.0)
MCHC: 34.1 g/dL (ref 30.0–36.0)
MCV: 93.4 fL (ref 78.0–100.0)
MONOS PCT: 7 %
Monocytes Absolute: 0.5 10*3/uL (ref 0.1–1.0)
NEUTROS PCT: 47 %
Neutro Abs: 3.5 10*3/uL (ref 1.7–7.7)
Platelets: 218 10*3/uL (ref 150–400)
RBC: 4.27 MIL/uL (ref 3.87–5.11)
RDW: 12.7 % (ref 11.5–15.5)
WBC: 7.3 10*3/uL (ref 4.0–10.5)

## 2016-12-24 LAB — TROPONIN I: Troponin I: <0.03 ng/mL (ref <0.03)

## 2016-12-24 MED ORDER — KETOROLAC TROMETHAMINE 60 MG/2ML IM SOLN
60.0000 mg | Freq: Once | INTRAMUSCULAR | Status: AC
  Administered 2016-12-24: 60 mg via INTRAMUSCULAR
  Filled 2016-12-24: qty 2

## 2016-12-24 MED ORDER — NITROGLYCERIN 0.4 MG SL SUBL: 0.4000 mg | SUBLINGUAL_TABLET | SUBLINGUAL | 0 refills | Status: DC | PRN

## 2016-12-24 MED ORDER — IBUPROFEN 800 MG PO TABS
800.0000 mg | ORAL_TABLET | Freq: Once | ORAL | Status: AC
  Administered 2016-12-24: 800 mg via ORAL
  Filled 2016-12-24: qty 1

## 2016-12-24 NOTE — Discharge Instructions (Signed)
Take nitroglycerin tablets if your chest pain returns.  Recommend follow-up with cardiologist soon.  Return here if worse.  No change in your blood pressure medicine for the time being.

## 2016-12-24 NOTE — ED Provider Notes (Signed)
[facility] EMERGENCY DEPARTMENT Provider Note   CSN: [id number] Arrival date & time: 12/24/16  1459     History   Chief Complaint Chief Complaint  Patient presents with  . Chest Pain    HPI Kara Matthews is a 41 y.o. female.  Chest pain with associated nausea, dyspnea, arm numbness earlier today.  Cardiac risk factors include hypertension and hyperlipidemia.  Past medical history includes aortic stenosis.  No known coronary artery disease.  She does smoke cigarettes.  Her pain is improved now.  Nothing makes symptoms better or worse.      Past Medical History:  Diagnosis Date  . Anxiety   . Aortic stenosis    Probable bicuspid aortic valve  . Essential hypertension   . Fibromyalgia   . Hyperlipidemia     Patient Active Problem List   Diagnosis Date Noted  . Lack of sexual desire 07/02/2016    Past Surgical History:  Procedure Laterality Date  . ABDOMINAL HYSTERECTOMY    . CESAREAN SECTION    . CHOLECYSTECTOMY      OB History    Gravida Para Term Preterm AB Living   2 2 2     2    SAB TAB Ectopic Multiple Live Births           2       Home Medications    Prior to Admission medications   Medication Sig Start Date End Date Taking? Authorizing Provider  Aspirin-Salicylamide-Caffeine (BC HEADACHE POWDER PO) Take by mouth as needed.   Yes [provider]  bisoprolol-hydrochlorothiazide (ZIAC) 5-6.25 MG tablet Take 1 tablet by mouth daily.   Yes [provider]  estrogens-methylTEST (ESTRATEST) 1.25-2.5 MG tablet Take 1 tablet by mouth daily. 07/02/16  Yes Jonnie Kind, MD  loratadine-pseudoephedrine (CLARITIN-D 12 HOUR) 5-120 MG tablet Take 1 tablet by mouth 2 (two) times daily. 11/17/16  Yes Lily Kocher, PA-C  azithromycin (ZITHROMAX) 250 MG tablet Take one po daily with food starting 11/18/16. Patient not taking: Reported on 12/24/2016 11/17/16   Lily Kocher, PA-C  dexamethasone (DECADRON) 4 MG tablet Take 1 tablet (4 mg total)  by mouth 2 (two) times daily with a meal. Patient not taking: Reported on 12/24/2016 11/17/16   Lily Kocher, PA-C  nitroGLYCERIN (NITROSTAT) 0.4 MG SL tablet Place 1 tablet (0.4 mg total) every 5 (five) minutes as needed under the tongue for chest pain. 12/24/16   Nat Christen, MD    Family History Family History  Problem Relation Age of Onset  . Stroke Father   . Heart failure Brother   . Stroke Mother     Social History Social History   Tobacco Use  . Smoking status: Current Every Day Smoker    Packs/day: 0.50    Years: 20.00    Pack years: 10.00    Types: Cigarettes  . Smokeless tobacco: Never Used  . Tobacco comment: trying to quit, slowing down  Substance Use Topics  . Alcohol use: No    Alcohol/week: 0.0 oz  . Drug use: No     Allergies   Bactrim [sulfamethoxazole-trimethoprim]; Ciprofloxacin; Codeine; and Flagyl [metronidazole]   Review of Systems Review of Systems  All other systems reviewed and are negative.    Physical Exam Updated Vital Signs BP (!) 151/87   Pulse (!) 58   Temp 97.9 F (36.6 C) (Oral)   Resp 11   Ht 4\' 11"  (1.499 m)   Wt 76.2 kg (168 lb)   SpO2 100%  BMI 33.93 kg/m   Physical Exam  Constitutional: She is oriented to person, place, and time. She appears well-developed and well-nourished.  HENT:  Head: Normocephalic and atraumatic.  Eyes: Conjunctivae are normal.  Neck: Neck supple.  Cardiovascular: Normal rate and regular rhythm.  Pulmonary/Chest: Effort normal and breath sounds normal.  Abdominal: Soft. Bowel sounds are normal.  Musculoskeletal: Normal range of motion.  Neurological: She is alert and oriented to person, place, and time.  Skin: Skin is warm and dry.  Psychiatric: She has a normal mood and affect. Her behavior is normal.  Nursing note and vitals reviewed.    ED Treatments / Results  Labs (all labs ordered are listed, but only abnormal results are displayed) Labs Reviewed  COMPREHENSIVE METABOLIC  PANEL - Abnormal; Notable for the following components:      Result Value   AST 47 (*)    All other components within normal limits  CBC WITH DIFFERENTIAL/PLATELET  TROPONIN I  TROPONIN I    EKG  EKG Interpretation  Date/Time:  Tuesday December 24 2016 15:07:44 EST Ventricular Rate:  66 PR Interval:    QRS Duration: 99 QT Interval:  430 QTC Calculation: 451 R Axis:   60 Text Interpretation:  Sinus rhythm Inferior infarct, age indeterminate Confirmed by Nat Christen [phone]) on 12/24/2016 5:43:42 PM       Radiology Dg Chest 2 View  Result Date: 12/24/2016 CLINICAL DATA:  Chest tightness EXAM: CHEST  2 VIEW COMPARISON:  11/17/2016 FINDINGS: The heart size and mediastinal contours are within normal limits. Both lungs are clear. The visualized skeletal structures are unremarkable. IMPRESSION: No acute abnormality noted. Electronically Signed   By: Inez Catalina M.D.   On: 12/24/2016 16:01    Procedures Procedures (including critical care time)  Medications Ordered in ED Medications  ibuprofen (ADVIL,MOTRIN) tablet 800 mg (800 mg Oral Given 12/24/16 1540)  ketorolac (TORADOL) injection 60 mg (60 mg Intramuscular Given 12/24/16 1818)     Initial Impression / Assessment and Plan / ED Course  I have reviewed the triage vital signs and the nursing notes.  Pertinent labs & imaging results that were available during my care of the patient were reviewed by me and considered in my medical decision making (see chart for details).    Patient has a good history for angina.  EKG normal.  Troponin negative x2.  She is feeling better at discharge.  Discussed test results with patient.  Will prescribe nitroglycerin.  She will follow-up with her cardiologist or return if worse.  Final Clinical Impressions(s) / ED Diagnoses   Final diagnoses:  Chest pain, unspecified type    ED Discharge Orders        Ordered    nitroGLYCERIN (NITROSTAT) 0.4 MG SL tablet  Every 5 min PRN     12/24/16  1944       Nat Christen, MD 12/24/16 1951

## 2016-12-24 NOTE — ED Triage Notes (Signed)
Chest pain with nausea °

## 2017-01-29 ENCOUNTER — Other Ambulatory Visit: Payer: Self-pay

## 2017-01-29 ENCOUNTER — Emergency Department (HOSPITAL_COMMUNITY)
Admission: EM | Admit: 2017-01-29 | Discharge: 2017-01-29 | Disposition: A | Discharge status: Home / Self Care | Payer: Medicaid Other | Attending: Emergency Medicine | Admitting: Emergency Medicine

## 2017-01-29 ENCOUNTER — Encounter (HOSPITAL_COMMUNITY): Payer: Self-pay | Admitting: *Deleted

## 2017-01-29 ENCOUNTER — Emergency Department (HOSPITAL_COMMUNITY): Payer: Medicaid Other

## 2017-01-29 DIAGNOSIS — I10 Essential (primary) hypertension: Secondary | ICD-10-CM | POA: Diagnosis not present

## 2017-01-29 DIAGNOSIS — F17210 Nicotine dependence, cigarettes, uncomplicated: Secondary | ICD-10-CM | POA: Insufficient documentation

## 2017-01-29 DIAGNOSIS — Z79899 Other long term (current) drug therapy: Secondary | ICD-10-CM | POA: Insufficient documentation

## 2017-01-29 DIAGNOSIS — J449 Chronic obstructive pulmonary disease, unspecified: Secondary | ICD-10-CM | POA: Insufficient documentation

## 2017-01-29 DIAGNOSIS — R05 Cough: Secondary | ICD-10-CM | POA: Diagnosis present

## 2017-01-29 DIAGNOSIS — J40 Bronchitis, not specified as acute or chronic: Secondary | ICD-10-CM

## 2017-01-29 DIAGNOSIS — R079 Chest pain, unspecified: Secondary | ICD-10-CM | POA: Insufficient documentation

## 2017-01-29 MED ORDER — CEFTRIAXONE SODIUM 1 G IJ SOLR
1.0000 g | Freq: Once | INTRAMUSCULAR | Status: AC
  Administered 2017-01-29: 1 g via INTRAMUSCULAR
  Filled 2017-01-29: qty 10

## 2017-01-29 MED ORDER — PREDNISONE 10 MG PO TABS: ORAL_TABLET | ORAL | 0 refills | Status: DC

## 2017-01-29 MED ORDER — AZITHROMYCIN 250 MG PO TABS: 250.0000 mg | ORAL_TABLET | Freq: Every day | ORAL | 0 refills | Status: DC

## 2017-01-29 MED ORDER — LIDOCAINE HCL (PF) 1 % IJ SOLN
INTRAMUSCULAR | Status: AC
  Administered 2017-01-29: 2.1 mL
  Filled 2017-01-29: qty 5

## 2017-01-29 NOTE — ED Notes (Signed)
Pt to xray

## 2017-01-29 NOTE — Discharge Instructions (Signed)
Continue using inhaler 2 puffs every 4 hours.

## 2017-01-29 NOTE — ED Provider Notes (Signed)
[facility] EMERGENCY DEPARTMENT Provider Note   CSN: [id number] Arrival date & time: 01/29/17  8921     History   Chief Complaint Chief Complaint  Patient presents with  . Cough    HPI Kara Matthews is a 41 y.o. female.  The history is provided by the patient. No language interpreter was used.  Cough  This is a new problem. The current episode started more than 2 days ago. The problem occurs constantly. The problem has been gradually worsening. The cough is productive of sputum. There has been no fever. Associated symptoms include chest pain. She has tried decongestants and cough syrup for the symptoms. The treatment provided moderate relief. She is a smoker. Her past medical history is significant for COPD. Her past medical history does not include pneumonia.  Pt reports she has had similar in the past.  Pt is requesting the shot she got last time.  Pt is using her albuterol inhaler.  She is trying to quit smoking.   Past Medical History:  Diagnosis Date  . Anxiety   . Aortic stenosis    Probable bicuspid aortic valve  . Essential hypertension   . Fibromyalgia   . Hyperlipidemia     Patient Active Problem List   Diagnosis Date Noted  . Lack of sexual desire 07/02/2016    Past Surgical History:  Procedure Laterality Date  . ABDOMINAL HYSTERECTOMY    . CESAREAN SECTION    . CHOLECYSTECTOMY      OB History    Gravida Para Term Preterm AB Living   2 2 2     2    SAB TAB Ectopic Multiple Live Births           2       Home Medications    Prior to Admission medications   Medication Sig Start Date End Date Taking? Authorizing Provider  Aspirin-Salicylamide-Caffeine (BC HEADACHE POWDER PO) Take by mouth as needed.    [provider]  azithromycin (ZITHROMAX) 250 MG tablet Take 1 tablet (250 mg total) by mouth daily. Take first 2 tablets together, then 1 every day until finished. 01/29/17   Fransico Meadow, PA-C  bisoprolol-hydrochlorothiazide (ZIAC)  5-6.25 MG tablet Take 1 tablet by mouth daily.    [provider]  estrogens-methylTEST (ESTRATEST) 1.25-2.5 MG tablet Take 1 tablet by mouth daily. 07/02/16   Jonnie Kind, MD  loratadine-pseudoephedrine (CLARITIN-D 12 HOUR) 5-120 MG tablet Take 1 tablet by mouth 2 (two) times daily. 11/17/16   Lily Kocher, PA-C  nitroGLYCERIN (NITROSTAT) 0.4 MG SL tablet Place 1 tablet (0.4 mg total) every 5 (five) minutes as needed under the tongue for chest pain. 12/24/16   Nat Christen, MD  predniSONE (DELTASONE) 10 MG tablet 6,5,4,3,2,1 taper 01/29/17   Fransico Meadow, PA-C    Family History Family History  Problem Relation Age of Onset  . Stroke Father   . Heart failure Brother   . Stroke Mother     Social History Social History   Tobacco Use  . Smoking status: Current Every Day Smoker    Packs/day: 0.50    Years: 20.00    Pack years: 10.00    Types: Cigarettes  . Smokeless tobacco: Never Used  . Tobacco comment: trying to quit, slowing down  Substance Use Topics  . Alcohol use: No    Alcohol/week: 0.0 oz  . Drug use: No     Allergies   Bactrim [sulfamethoxazole-trimethoprim]; Ciprofloxacin; Codeine; and Flagyl [metronidazole]  Review of Systems Review of Systems  Respiratory: Positive for cough.   Cardiovascular: Positive for chest pain.  All other systems reviewed and are negative.    Physical Exam Updated Vital Signs BP 135/80   Pulse 66   Temp 98.6 F (37 C) (Oral)   Resp 18   Ht 4\' 11"  (1.499 m)   Wt 74.8 kg (165 lb)   SpO2 99%   BMI 33.33 kg/m   Physical Exam  Constitutional: She appears well-developed and well-nourished. No distress.  HENT:  Head: Normocephalic and atraumatic.  Right Ear: External ear normal.  Left Ear: External ear normal.  Nose: Nose normal.  Mouth/Throat: Oropharynx is clear and moist.  Eyes: Conjunctivae are normal.  Neck: Neck supple.  Cardiovascular: Normal rate and regular rhythm.  No murmur  heard. Pulmonary/Chest: Effort normal. No respiratory distress.  Rhonchi, harsh cough   Abdominal: Soft. There is no tenderness.  Musculoskeletal: She exhibits no edema.  Neurological: She is alert.  Skin: Skin is warm and dry.  Psychiatric: She has a normal mood and affect.  Nursing note and vitals reviewed.    ED Treatments / Results  Labs (all labs ordered are listed, but only abnormal results are displayed) Labs Reviewed - No data to display  EKG  EKG Interpretation None       Radiology Dg Chest 2 View  Result Date: 01/29/2017 CLINICAL DATA:  Productive cough for 3 days. Pain in ribs with cough. Sinus infection. Congestion. Smoker. EXAM: CHEST  2 VIEW COMPARISON:  12/24/2016. FINDINGS: The heart size and mediastinal contours are within normal limits. Both lungs are clear. The visualized skeletal structures are unremarkable. IMPRESSION: No active cardiopulmonary disease.  Stable exam. Electronically Signed   By: Staci Righter M.D.   On: 01/29/2017 19:39    Procedures Procedures (including critical care time)  Medications Ordered in ED Medications  cefTRIAXone (ROCEPHIN) injection 1 g (1 g Intramuscular Given 01/29/17 2029)  lidocaine (PF) (XYLOCAINE) 1 % injection (2.1 mLs  Given 01/29/17 2029)     Initial Impression / Assessment and Plan / ED Course  I have reviewed the triage vital signs and the nursing notes.  Pertinent labs & imaging results that were available during my care of the patient were reviewed by me and considered in my medical decision making (see chart for details).     Pt advised to continue using inhaler.  Pt given Rocephin IM.   Final Clinical Impressions(s) / ED Diagnoses   Final diagnoses:  Bronchitis    ED Discharge Orders        Ordered    azithromycin (ZITHROMAX) 250 MG tablet  Daily     01/29/17 2010    predniSONE (DELTASONE) 10 MG tablet     01/29/17 2010    An After Visit Summary was printed and given to the patient.    Sidney Ace 01/29/17 2106    Isla Pence, MD 01/29/17 [phone]

## 2017-01-29 NOTE — ED Triage Notes (Signed)
Pt c/o cough that is productive with dark green sputum that started 3 days ago, pt reports that she was recently treated for sinus infection as well,

## 2017-03-26 ENCOUNTER — Other Ambulatory Visit: Payer: Self-pay | Admitting: Obstetrics & Gynecology

## 2017-03-29 ENCOUNTER — Encounter (HOSPITAL_COMMUNITY): Payer: Self-pay | Admitting: Emergency Medicine

## 2017-03-29 ENCOUNTER — Emergency Department (HOSPITAL_COMMUNITY)
Admission: EM | Admit: 2017-03-29 | Discharge: 2017-03-29 | Disposition: A | Discharge status: Home / Self Care | Payer: Medicaid Other | Attending: Emergency Medicine | Admitting: Emergency Medicine

## 2017-03-29 DIAGNOSIS — J40 Bronchitis, not specified as acute or chronic: Secondary | ICD-10-CM | POA: Diagnosis not present

## 2017-03-29 DIAGNOSIS — F17210 Nicotine dependence, cigarettes, uncomplicated: Secondary | ICD-10-CM | POA: Insufficient documentation

## 2017-03-29 DIAGNOSIS — R05 Cough: Secondary | ICD-10-CM | POA: Diagnosis present

## 2017-03-29 DIAGNOSIS — Z79899 Other long term (current) drug therapy: Secondary | ICD-10-CM | POA: Diagnosis not present

## 2017-03-29 MED ORDER — AZITHROMYCIN 250 MG PO TABS
ORAL_TABLET | ORAL | 0 refills | Status: DC
Start: 2017-03-29 — End: 2017-08-22

## 2017-03-29 MED ORDER — GUAIFENESIN-CODEINE 100-10 MG/5ML PO SYRP: 10.0000 mL | ORAL_SOLUTION | Freq: Three times a day (TID) | ORAL | 0 refills | Status: DC | PRN

## 2017-03-29 MED ORDER — ALBUTEROL SULFATE HFA 108 (90 BASE) MCG/ACT IN AERS
2.0000 | INHALATION_SPRAY | Freq: Once | RESPIRATORY_TRACT | Status: AC
  Administered 2017-03-29: 2 via RESPIRATORY_TRACT
  Filled 2017-03-29: qty 6.7

## 2017-03-29 NOTE — Discharge Instructions (Signed)
1-2 puffs of the albuterol inhaler every 4-6 hours as needed.  Drink plenty of fluids.  Tylenol or ibuprofen if needed for fever.  Follow-up with your primary doctor or return to the ER for any worsening symptoms

## 2017-03-29 NOTE — ED Triage Notes (Signed)
Pt reports cough and congestion since Monday.

## 2017-03-29 NOTE — ED Notes (Signed)
Respiratory paged at this time for tx.  

## 2017-03-30 NOTE — ED Provider Notes (Signed)
[facility] EMERGENCY DEPARTMENT Provider Note   CSN: [id number] Arrival date & time: 03/29/17  1353     History   Chief Complaint Chief Complaint  Patient presents with  . Cough    HPI Kara Matthews is a 42 y.o. female.  HPI   Kara Matthews is a 42 y.o. female who presents to the Emergency Department complaining of cough and nasal congestion for nearly 1 week.  She states the cough is been nonproductive.  She denies known sick contacts.  No fever, chest pain or shortness of breath.  She states she has been treated for bronchitis in the past and states her symptoms feel similar.  She has not taken any over-the-counter medications prior to arrival.   Past Medical History:  Diagnosis Date  . Anxiety   . Aortic stenosis    Probable bicuspid aortic valve  . Essential hypertension   . Fibromyalgia   . Hyperlipidemia     Patient Active Problem List   Diagnosis Date Noted  . Lack of sexual desire 07/02/2016    Past Surgical History:  Procedure Laterality Date  . ABDOMINAL HYSTERECTOMY    . CESAREAN SECTION    . CHOLECYSTECTOMY      OB History    Gravida Para Term Preterm AB Living   2 2 2     2    SAB TAB Ectopic Multiple Live Births           2       Home Medications    Prior to Admission medications   Medication Sig Start Date End Date Taking? Authorizing Provider  Aspirin-Salicylamide-Caffeine (BC HEADACHE POWDER PO) Take by mouth as needed.    [provider]  azithromycin (ZITHROMAX) 250 MG tablet Take first 2 tablets together, then 1 every day until finished. 03/29/17   Chenita Ruda, PA-C  bisoprolol-hydrochlorothiazide (ZIAC) 5-6.25 MG tablet Take 1 tablet by mouth daily.    [provider]  estrogens-methylTEST (ESTRATEST) 1.25-2.5 MG tablet Take 1 tablet by mouth daily. 07/02/16   Jonnie Kind, MD  guaiFENesin-codeine (ROBITUSSIN AC) 100-10 MG/5ML syrup Take 10 mLs by mouth 3 (three) times daily as needed. 03/29/17    Josanne Boerema, PA-C  loratadine-pseudoephedrine (CLARITIN-D 12 HOUR) 5-120 MG tablet Take 1 tablet by mouth 2 (two) times daily. 11/17/16   Lily Kocher, PA-C  nitroGLYCERIN (NITROSTAT) 0.4 MG SL tablet Place 1 tablet (0.4 mg total) every 5 (five) minutes as needed under the tongue for chest pain. 12/24/16   Nat Christen, MD  predniSONE (DELTASONE) 10 MG tablet 6,5,4,3,2,1 taper 01/29/17   Caryl Ada K, PA-C  PREMARIN 1.25 MG tablet TAKE (1) TABLET BY MOUTH ONCE DAILY. 03/26/17   Florian Buff, MD    Family History Family History  Problem Relation Age of Onset  . Stroke Father   . Heart failure Brother   . Stroke Mother     Social History Social History   Tobacco Use  . Smoking status: Current Every Day Smoker    Packs/day: 0.50    Years: 20.00    Pack years: 10.00    Types: Cigarettes  . Smokeless tobacco: Never Used  Substance Use Topics  . Alcohol use: No    Alcohol/week: 0.0 oz  . Drug use: No     Allergies   Bactrim [sulfamethoxazole-trimethoprim]; Ciprofloxacin; Codeine; and Flagyl [metronidazole]   Review of Systems Review of Systems  Constitutional: Negative for appetite change, chills and fever.  HENT: Positive for congestion. Negative  for sore throat and trouble swallowing.   Respiratory: Positive for cough and chest tightness. Negative for shortness of breath and wheezing.   Cardiovascular: Negative for chest pain.  Gastrointestinal: Negative for abdominal pain, nausea and vomiting.  Genitourinary: Negative for dysuria.  Musculoskeletal: Negative for arthralgias.  Skin: Negative for rash.  Neurological: Negative for dizziness, weakness and numbness.  Hematological: Negative for adenopathy.  All other systems reviewed and are negative.    Physical Exam Updated Vital Signs BP 135/86 (BP Location: Left Arm)   Pulse 80   Temp 98.4 F (36.9 C) (Oral)   Resp 20   Ht 4\' 11"  (1.499 m)   Wt 76.2 kg (168 lb)   SpO2 98%   BMI 33.93 kg/m   Physical  Exam  Constitutional: She is oriented to person, place, and time. She appears well-developed and well-nourished. No distress.  HENT:  Head: Normocephalic and atraumatic.  Right Ear: Tympanic membrane and ear canal normal.  Left Ear: Tympanic membrane and ear canal normal.  Mouth/Throat: Uvula is midline, oropharynx is clear and moist and mucous membranes are normal. No oropharyngeal exudate.  Eyes: EOM are normal. Pupils are equal, round, and reactive to light.  Neck: Normal range of motion, full passive range of motion without pain and phonation normal. Neck supple.  Cardiovascular: Normal rate, regular rhythm and intact distal pulses.  No murmur heard. Pulmonary/Chest: Effort normal. No stridor. No respiratory distress. She has wheezes. She has no rales. She exhibits no tenderness.  Few scattered expiratory wheezes.  No rales  Abdominal: Soft. She exhibits no distension. There is no tenderness.  Musculoskeletal: She exhibits no edema.  Lymphadenopathy:    She has no cervical adenopathy.  Neurological: She is alert and oriented to person, place, and time. She exhibits normal muscle tone. Coordination normal.  Skin: Skin is warm and dry.  Nursing note and vitals reviewed.    ED Treatments / Results  Labs (all labs ordered are listed, but only abnormal results are displayed) Labs Reviewed - No data to display  EKG  EKG Interpretation None       Radiology No results found.  Procedures Procedures (including critical care time)  Medications Ordered in ED Medications  albuterol (PROVENTIL HFA;VENTOLIN HFA) 108 (90 Base) MCG/ACT inhaler 2 puff (2 puffs Inhalation Given 03/29/17 1553)     Initial Impression / Assessment and Plan / ED Course  I have reviewed the triage vital signs and the nursing notes.  Pertinent labs & imaging results that were available during my care of the patient were reviewed by me and considered in my medical decision making (see chart for  details).    Patient well-appearing.  No hypoxia or tachycardia.  Lung sounds improved after albuterol, MDI dispensed for home use.  Patient appears safe for discharge home agrees to treatment plan and close outpatient follow-up if needed.  Final Clinical Impressions(s) / ED Diagnoses   Final diagnoses:  Bronchitis    ED Discharge Orders        Ordered    azithromycin (ZITHROMAX) 250 MG tablet     03/29/17 1654    guaiFENesin-codeine (ROBITUSSIN AC) 100-10 MG/5ML syrup  3 times daily PRN     03/29/17 1654       Renny Gunnarson, Lynelle Smoke, PA-C 03/30/17 2119    Francine Graven, DO 03/31/17 [phone]

## 2017-07-17 ENCOUNTER — Other Ambulatory Visit: Payer: Self-pay

## 2017-07-17 ENCOUNTER — Ambulatory Visit (INDEPENDENT_AMBULATORY_CARE_PROVIDER_SITE_OTHER): Payer: Medicaid Other

## 2017-07-17 DIAGNOSIS — I350 Nonrheumatic aortic (valve) stenosis: Secondary | ICD-10-CM

## 2017-07-18 ENCOUNTER — Telehealth: Payer: Self-pay

## 2017-07-18 NOTE — Telephone Encounter (Signed)
Patient notified. Routed to PCP 

## 2017-07-18 NOTE — Telephone Encounter (Signed)
-----   Message from Acquanetta Chain, LPN sent at 0/16/5800 10:19 AM EDT -----   ----- Message ----- From: Satira Sark, MD Sent: 07/18/2017   8:17 AM To: Merlene Laughter, LPN  Results reviewed.  Overall stable, mild to moderate aortic stenosis compared to last years' study.  Can discuss further at office follow-up. A copy of this test should be forwarded to [facility], [facility].

## 2017-08-06 ENCOUNTER — Other Ambulatory Visit: Payer: Medicaid Other

## 2017-08-20 NOTE — Progress Notes (Signed)
Cardiology Office Note  Date: 08/22/2017   ID: Kara Matthews, DOB March 26, 1975, MRN [id number]  PCP: [facility], [facility]  Primary Cardiologist: Rozann Lesches, MD   Chief Complaint  Patient presents with  . Aortic Stenosis    History of Present Illness: Kara Matthews is a 42 y.o. female last seen in June 2018.  She presents for a routine follow-up visit.  I reviewed her records.  She was seen in the ER at [facility] back in November with chest discomfort.  Troponin I levels were negative at that time.  ECG showed sinus rhythm with possible old inferior infarct pattern.  She does not have any personal history of CAD.  Cardiac risk factors include hypertension and hyperlipidemia, also family history of premature CAD.  She reports intermittent chest discomfort more associated with stress than with exertion.  She has had no palpitations or syncope.  Recent follow-up echocardiogram in May revealed LVEF 60 to 65% with mild to moderate aortic stenosis, mean gradient 17 mmHg.  We discussed the results and we will plan on a follow-up echocardiogram in 1 year.  They we talked about arranging a stress test for further objective ischemic evaluation.  He has not had a recent lipid panel and I encouraged her to do this with her PCP.  She is currently not taking any lipid-lowering agents.  Past Medical History:  Diagnosis Date  . Anxiety   . Aortic stenosis    Probable bicuspid aortic valve  . Essential hypertension   . Fibromyalgia   . Hyperlipidemia     Past Surgical History:  Procedure Laterality Date  . ABDOMINAL HYSTERECTOMY    . CESAREAN SECTION    . CHOLECYSTECTOMY      Current Outpatient Medications  Medication Sig Dispense Refill  . Aspirin-Salicylamide-Caffeine (BC HEADACHE POWDER PO) Take by mouth as needed.    . bisoprolol-hydrochlorothiazide (ZIAC) 5-6.25 MG tablet Take 1 tablet by mouth daily.    Marland Kitchen estrogens-methylTEST (ESTRATEST) 1.25-2.5 MG tablet Take  1 tablet by mouth daily. 30 tablet 5  . loratadine-pseudoephedrine (CLARITIN-D 12 HOUR) 5-120 MG tablet Take 1 tablet by mouth 2 (two) times daily. 20 tablet 0  . nitroGLYCERIN (NITROSTAT) 0.4 MG SL tablet Place 1 tablet (0.4 mg total) every 5 (five) minutes as needed under the tongue for chest pain. 30 tablet 0  . PREMARIN 1.25 MG tablet TAKE (1) TABLET BY MOUTH ONCE DAILY. 30 tablet 11   No current facility-administered medications for this visit.    Allergies:  Bactrim [sulfamethoxazole-trimethoprim]; Ciprofloxacin; Codeine; and Flagyl [metronidazole]   Social History: The patient  reports that she has been smoking cigarettes.  She has a 20.00 pack-year smoking history. She has never used smokeless tobacco. She reports that she does not drink alcohol or use drugs.   ROS:  Please see the history of present illness. Otherwise, complete review of systems is positive for none.  All other systems are reviewed and negative.   Physical Exam: VS:  BP (!) 147/90   Pulse 72   Ht 4\' 11"  (1.499 m)   Wt 181 lb (82.1 kg)   SpO2 97%   BMI 36.56 kg/m , BMI Body mass index is 36.56 kg/m.  Wt Readings from Last 3 Encounters:  08/22/17 181 lb (82.1 kg)  03/29/17 168 lb (76.2 kg)  01/29/17 165 lb (74.8 kg)    General: Patient appears comfortable at rest. HEENT: Conjunctiva and lids normal, oropharynx clear. Neck: Supple, no elevated JVP or carotid  bruits, no thyromegaly. Lungs: Clear to auscultation, nonlabored breathing at rest. Cardiac: Regular rate and rhythm, no S3, 2/6 systolic murmur. Abdomen: Soft, nontender, bowel sounds present. Extremities: No pitting edema, distal pulses 2+. Skin: Warm and dry. Musculoskeletal: No kyphosis. Neuropsychiatric: Alert and oriented x3, affect grossly appropriate.  ECG: I personally reviewed the tracing from 12/24/2016 which showed sinus rhythm with Q in lead III.   Recent Labwork: 12/24/2016: ALT 34; AST 47; BUN 10; Creatinine, Ser 0.72; Hemoglobin 13.6;  Platelets 218; Potassium 3.8; Sodium 140   Other Studies Reviewed Today:  Echocardiogram 07/17/2017: Study Conclusions  - Left ventricle: The cavity size was normal. Wall thickness was   normal. Systolic function was normal. The estimated ejection   fraction was in the range of 60% to 65%. Wall motion was normal;   there were no regional wall motion abnormalities. Left   ventricular diastolic function parameters were normal. - Aortic valve: Mean gradient (S): 17 mm Hg. Valve area (VTI): 1.45   cm^2. Valve area (Vmax): 1.41 cm^2. Valve area (Vmean): 1.48   cm^2. - Left atrium: The atrium was mildly dilated. - Atrial septum: No defect or patent foramen ovale was identified. - Technically adequate study.  Assessment and Plan:  1.  Bicuspid aortic valve with mild to moderate aortic stenosis.  No change on examination.  Would not expect any symptoms at this time.  We will obtain a follow-up echocardiogram in 1 year.  2.  Intermittent chest pain as discussed above.  In light of cardiac risk factors including family history of premature CAD, hypertension, and hyperlipidemia, we will obtain a Lexiscan Myoview for further assessment.  3.  Hyperlipidemia by history.  I encouraged her to follow-up with her PCP for lipid panel.  She is not on any lipid-lowering agents at this time.  4.  Tobacco abuse.  Smoking cessation encouraged.  Current medicines were reviewed with the patient today.   Orders Placed This Encounter  Procedures  . NM Myocar Multi W/Spect W/Wall Motion / EF  . ECHOCARDIOGRAM COMPLETE    Disposition: Call with test results.  Signed, Satira Sark, MD, [facility] 08/22/2017 2:48 PM    [facility] [address] [place] [address], [place], [place] [address] Phone: [phone]; Fax: [phone]

## 2017-08-22 ENCOUNTER — Encounter: Payer: Self-pay | Admitting: *Deleted

## 2017-08-22 ENCOUNTER — Ambulatory Visit (INDEPENDENT_AMBULATORY_CARE_PROVIDER_SITE_OTHER): Payer: Medicaid Other | Admitting: Cardiology

## 2017-08-22 ENCOUNTER — Other Ambulatory Visit: Payer: Self-pay

## 2017-08-22 ENCOUNTER — Encounter: Payer: Self-pay | Admitting: Cardiology

## 2017-08-22 VITALS — BP 147/90 | HR 72 | Ht 59.0 in | Wt 181.0 lb

## 2017-08-22 DIAGNOSIS — E782 Mixed hyperlipidemia: Secondary | ICD-10-CM | POA: Diagnosis not present

## 2017-08-22 DIAGNOSIS — R9431 Abnormal electrocardiogram [ECG] [EKG]: Secondary | ICD-10-CM

## 2017-08-22 DIAGNOSIS — Z720 Tobacco use: Secondary | ICD-10-CM | POA: Diagnosis not present

## 2017-08-22 DIAGNOSIS — Z8249 Family history of ischemic heart disease and other diseases of the circulatory system: Secondary | ICD-10-CM | POA: Diagnosis not present

## 2017-08-22 DIAGNOSIS — I350 Nonrheumatic aortic (valve) stenosis: Secondary | ICD-10-CM

## 2017-08-22 DIAGNOSIS — R072 Precordial pain: Secondary | ICD-10-CM

## 2017-08-22 NOTE — Patient Instructions (Addendum)
Medication Instructions:   Your physician recommends that you continue on your current medications as directed. Please refer to the Current Medication list given to you today.  Labwork:  NONE  Testing/Procedures: Your physician has requested that you have a lexiscan myoview. For further information please visit [email or web address]. Please follow instruction sheet, as given. Your physician has requested that you have an echocardiogram in May 2020. Echocardiography is a painless test that uses sound waves to create images of your heart. It provides your doctor with information about the size and shape of your heart and how well your heart's chambers and valves are working. This procedure takes approximately one hour. There are no restrictions for this procedure.  Follow-Up:  Your physician recommends that you schedule a follow-up appointment in: 1 year. You will receive a reminder letter in the mail in about 10 months reminding you to call and schedule your appointment. If you don't receive this letter, please contact our office.  Any Other Special Instructions Will Be Listed Below (If Applicable).  Based on your stress test result, you may have to be seen earlier than one year but we will let you know about this.  If you need a refill on your cardiac medications before your next appointment, please call your pharmacy.

## 2017-08-25 ENCOUNTER — Encounter (HOSPITAL_COMMUNITY): Payer: Self-pay | Admitting: Emergency Medicine

## 2017-08-25 ENCOUNTER — Emergency Department (HOSPITAL_COMMUNITY)
Admission: EM | Admit: 2017-08-25 | Discharge: 2017-08-25 | Disposition: A | Discharge status: Home / Self Care | Payer: Medicaid Other | Attending: Emergency Medicine | Admitting: Emergency Medicine

## 2017-08-25 ENCOUNTER — Other Ambulatory Visit: Payer: Self-pay

## 2017-08-25 DIAGNOSIS — M62830 Muscle spasm of back: Secondary | ICD-10-CM | POA: Insufficient documentation

## 2017-08-25 DIAGNOSIS — L089 Local infection of the skin and subcutaneous tissue, unspecified: Secondary | ICD-10-CM

## 2017-08-25 DIAGNOSIS — Y939 Activity, unspecified: Secondary | ICD-10-CM | POA: Diagnosis not present

## 2017-08-25 DIAGNOSIS — S20369A Insect bite (nonvenomous) of unspecified front wall of thorax, initial encounter: Secondary | ICD-10-CM | POA: Diagnosis not present

## 2017-08-25 DIAGNOSIS — R21 Rash and other nonspecific skin eruption: Secondary | ICD-10-CM | POA: Diagnosis present

## 2017-08-25 DIAGNOSIS — F17210 Nicotine dependence, cigarettes, uncomplicated: Secondary | ICD-10-CM | POA: Insufficient documentation

## 2017-08-25 DIAGNOSIS — Z79899 Other long term (current) drug therapy: Secondary | ICD-10-CM | POA: Insufficient documentation

## 2017-08-25 DIAGNOSIS — I10 Essential (primary) hypertension: Secondary | ICD-10-CM | POA: Diagnosis not present

## 2017-08-25 DIAGNOSIS — Y929 Unspecified place or not applicable: Secondary | ICD-10-CM | POA: Diagnosis not present

## 2017-08-25 DIAGNOSIS — W57XXXA Bitten or stung by nonvenomous insect and other nonvenomous arthropods, initial encounter: Secondary | ICD-10-CM | POA: Insufficient documentation

## 2017-08-25 DIAGNOSIS — Y999 Unspecified external cause status: Secondary | ICD-10-CM | POA: Insufficient documentation

## 2017-08-25 DIAGNOSIS — S20361A Insect bite (nonvenomous) of right front wall of thorax, initial encounter: Secondary | ICD-10-CM

## 2017-08-25 MED ORDER — DOXYCYCLINE HYCLATE 100 MG PO TABS
100.0000 mg | ORAL_TABLET | Freq: Once | ORAL | Status: AC
  Administered 2017-08-25: 100 mg via ORAL
  Filled 2017-08-25: qty 1

## 2017-08-25 MED ORDER — CYCLOBENZAPRINE HCL 10 MG PO TABS
10.0000 mg | ORAL_TABLET | Freq: Once | ORAL | Status: AC
  Administered 2017-08-25: 10 mg via ORAL
  Filled 2017-08-25: qty 1

## 2017-08-25 MED ORDER — MELOXICAM 15 MG PO TABS: 15.0000 mg | ORAL_TABLET | Freq: Every day | ORAL | 0 refills | Status: DC

## 2017-08-25 MED ORDER — DOXYCYCLINE HYCLATE 100 MG PO CAPS: 100.0000 mg | ORAL_CAPSULE | Freq: Two times a day (BID) | ORAL | 0 refills | Status: DC

## 2017-08-25 MED ORDER — KETOROLAC TROMETHAMINE 10 MG PO TABS
10.0000 mg | ORAL_TABLET | Freq: Once | ORAL | Status: AC
  Administered 2017-08-25: 10 mg via ORAL
  Filled 2017-08-25: qty 1

## 2017-08-25 MED ORDER — CYCLOBENZAPRINE HCL 10 MG PO TABS: 10.0000 mg | ORAL_TABLET | Freq: Three times a day (TID) | ORAL | 0 refills | Status: DC

## 2017-08-25 NOTE — ED Provider Notes (Signed)
[name] EMERGENCY DEPARTMENT Provider Note   CSN: [id number] Arrival date & time: 08/25/17  2014     History   Chief Complaint Chief Complaint  Patient presents with  . Insect Bite    HPI Kara Matthews is a 42 y.o. female.  Patient is a 42 year old female who presents to the emergency department with a complaint of an insect bite to the left shoulder and chest area.  The patient states that this problem started about 2 to 3 days ago.  She says something better.  She thinks it was a spider because someone was trying to fan what ever the insect was away from her.  She says she should occur by house, but she did not see anything fall out of her blouse area.  The following day she noted a red raised area on her right shoulder/chest area.  This was followed by pain of the right shoulder.  She also notes at times pain from the bite area going up toward the neck.  She was attempting to do her regular job earlier today and noticed that she had increasing pain with movement.  No fever, no chills reported.  Minimal drainage noted.  Patient presents now for assistance with this issue.     Past Medical History:  Diagnosis Date  . Anxiety   . Aortic stenosis    Probable bicuspid aortic valve  . Essential hypertension   . Fibromyalgia   . Hyperlipidemia     Patient Active Problem List   Diagnosis Date Noted  . Lack of sexual desire 07/02/2016    Past Surgical History:  Procedure Laterality Date  . ABDOMINAL HYSTERECTOMY    . CESAREAN SECTION    . CHOLECYSTECTOMY       OB History    Gravida  2   Para  2   Term  2   Preterm      AB      Living  2     SAB      TAB      Ectopic      Multiple      Live Births  2            Home Medications    Prior to Admission medications   Medication Sig Start Date End Date Taking? Authorizing Provider  Aspirin-Salicylamide-Caffeine (BC HEADACHE POWDER PO) Take by mouth as needed.    [provider]    bisoprolol-hydrochlorothiazide (ZIAC) 5-6.25 MG tablet Take 1 tablet by mouth daily.    [provider]  cyclobenzaprine (FLEXERIL) 10 MG tablet Take 1 tablet (10 mg total) by mouth 3 (three) times daily. 08/25/17   Lily Kocher, PA-C  doxycycline (VIBRAMYCIN) 100 MG capsule Take 1 capsule (100 mg total) by mouth 2 (two) times daily. 08/25/17   Lily Kocher, PA-C  estrogens-methylTEST (ESTRATEST) 1.25-2.5 MG tablet Take 1 tablet by mouth daily. 07/02/16   Jonnie Kind, MD  loratadine-pseudoephedrine (CLARITIN-D 12 HOUR) 5-120 MG tablet Take 1 tablet by mouth 2 (two) times daily. 11/17/16   Lily Kocher, PA-C  meloxicam (MOBIC) 15 MG tablet Take 1 tablet (15 mg total) by mouth daily. 08/25/17   Lily Kocher, PA-C  nitroGLYCERIN (NITROSTAT) 0.4 MG SL tablet Place 1 tablet (0.4 mg total) every 5 (five) minutes as needed under the tongue for chest pain. 12/24/16   Nat Christen, MD  PREMARIN 1.25 MG tablet TAKE (1) TABLET BY MOUTH ONCE DAILY. 03/26/17   Florian Buff, MD  Family History Family History  Problem Relation Age of Onset  . Stroke Father   . Heart failure Brother   . Stroke Mother     Social History Social History   Tobacco Use  . Smoking status: Current Every Day Smoker    Packs/day: 1.00    Years: 20.00    Pack years: 20.00    Types: Cigarettes  . Smokeless tobacco: Never Used  Substance Use Topics  . Alcohol use: No    Alcohol/week: 0.0 oz  . Drug use: No     Allergies   Bactrim [sulfamethoxazole-trimethoprim]; Ciprofloxacin; Codeine; and Flagyl [metronidazole]   Review of Systems Review of Systems  Constitutional: Negative for activity change, chills and fever.       All ROS Neg except as noted in HPI  HENT: Negative for nosebleeds.   Eyes: Negative for photophobia and discharge.  Respiratory: Negative for cough, shortness of breath and wheezing.   Cardiovascular: Negative for chest pain and palpitations.  Gastrointestinal: Negative for abdominal  pain and blood in stool.  Genitourinary: Negative for dysuria, frequency and hematuria.  Musculoskeletal: Positive for back pain. Negative for arthralgias and neck pain.       Upper back pain  Skin: Positive for wound.       Insect bite right shoulder/upper chest.  Neurological: Negative for dizziness, seizures and speech difficulty.  Psychiatric/Behavioral: Negative for confusion and hallucinations.     Physical Exam Updated Vital Signs BP 136/83 (BP Location: Right Arm)   Pulse 71   Temp 98.1 F (36.7 C) (Oral)   Resp 18   Ht 4\' 11"  (1.499 m)   Wt 82.1 kg (181 lb)   SpO2 99%   BMI 36.56 kg/m   Physical Exam  Constitutional: She is oriented to person, place, and time. She appears well-developed and well-nourished.  Non-toxic appearance.  HENT:  Head: Normocephalic.  Right Ear: Tympanic membrane and external ear normal.  Left Ear: Tympanic membrane and external ear normal.  Eyes: Pupils are equal, round, and reactive to light. EOM and lids are normal.  Neck: Normal range of motion. Neck supple. Carotid bruit is not present.  No cervical lymphadenopathy appreciated.  No carotid bruits appreciated.  Cardiovascular: Normal rate, regular rhythm, normal heart sounds, intact distal pulses and normal pulses.  Pulmonary/Chest: Breath sounds normal. No respiratory distress.  Abdominal: Soft. Bowel sounds are normal. There is no tenderness. There is no guarding.  Musculoskeletal: Normal range of motion.  Patient has a red raised area of the right upper chest/shoulder area.  There are no red streaks appreciated.  There is a scab in the center of this area.  There is one small open area near the scab.  The area is tender to palpation.   Lymphadenopathy:       Head (right side): No submandibular adenopathy present.       Head (left side): No submandibular adenopathy present.    She has no cervical adenopathy.  Neurological: She is alert and oriented to person, place, and time. She has  normal strength. No cranial nerve deficit or sensory deficit.  Skin: Skin is warm and dry.  Psychiatric: She has a normal mood and affect. Her speech is normal.  Nursing note and vitals reviewed.    ED Treatments / Results  Labs (all labs ordered are listed, but only abnormal results are displayed) Labs Reviewed - No data to display  EKG None  Radiology No results found.  Procedures Procedures (including critical care time)  Medications Ordered in ED Medications  cyclobenzaprine (FLEXERIL) tablet 10 mg (10 mg Oral Given 08/25/17 2215)  doxycycline (VIBRA-TABS) tablet 100 mg (100 mg Oral Given 08/25/17 2215)  ketorolac (TORADOL) tablet 10 mg (10 mg Oral Given 08/25/17 2215)     Initial Impression / Assessment and Plan / ED Course  I have reviewed the triage vital signs and the nursing notes.  Pertinent labs & imaging results that were available during my care of the patient were reviewed by me and considered in my medical decision making (see chart for details).       Final Clinical Impressions(s) / ED Diagnoses MDM  Vital signs are within normal limits.  Pulse oximetry is 99% on room air.  Patient has a bite to the right upper chest/shoulder area.  There is increased redness around the area.  Will cover with doxycycline.  I have asked the patient to use warm Epson salt soaks over the area.  The patient also has tightness and tenseness over the upper trapezius area.  Will use Flexeril and meloxicam for this area.  I have asked the patient to follow-up with her primary physician or return to the emergency department if any changes in her condition, problems, or concerns.   Final diagnoses:  Infected insect bite of chest, right, initial encounter  Muscle spasm of back    ED Discharge Orders        Ordered    cyclobenzaprine (FLEXERIL) 10 MG tablet  3 times daily     08/25/17 2213    doxycycline (VIBRAMYCIN) 100 MG capsule  2 times daily     08/25/17 2213    meloxicam  (MOBIC) 15 MG tablet  Daily     08/25/17 2213       Lily Kocher, PA-C 08/25/17 2234    Nat Christen, MD 08/26/17 [phone]

## 2017-08-25 NOTE — ED Triage Notes (Signed)
Pt has insect bite to the left shoulder x 2 days.

## 2017-08-25 NOTE — Discharge Instructions (Addendum)
You have an infected insect bite of the right upper chest.  Please use warm Epson salt soaks for 10 to 15 minutes daily until this heals.  Please apply a Band-Aid over it daily so the close do not rub against the area.  Please use doxycycline 2 times daily with food.  You have spasm involving the muscles of your upper back/trapezius area.  Please use Flexeril 3 times daily when possible.  This medication will cause drowsiness, please do not drive a vehicle, operate machinery, drink alcohol, or participate in activities requiring concentration when taking this medication.  Use meloxicam daily with a meal.  May use Tylenol extra strength every 4 hours if needed for additional pain.

## 2017-08-29 ENCOUNTER — Encounter (HOSPITAL_COMMUNITY)
Admission: RE | Admit: 2017-08-29 | Discharge: 2017-08-29 | Disposition: A | Discharge status: Home / Self Care | Payer: Medicaid Other | Source: Ambulatory Visit | Attending: Cardiology | Admitting: Cardiology

## 2017-08-29 ENCOUNTER — Encounter (HOSPITAL_BASED_OUTPATIENT_CLINIC_OR_DEPARTMENT_OTHER)
Admission: RE | Admit: 2017-08-29 | Discharge: 2017-08-29 | Disposition: A | Discharge status: Home / Self Care | Payer: Medicaid Other | Source: Ambulatory Visit | Attending: Cardiology | Admitting: Cardiology

## 2017-08-29 ENCOUNTER — Encounter (HOSPITAL_COMMUNITY): Payer: Self-pay

## 2017-08-29 DIAGNOSIS — R072 Precordial pain: Secondary | ICD-10-CM | POA: Diagnosis not present

## 2017-08-29 LAB — NM MYOCAR MULTI W/SPECT W/WALL MOTION / EF
CSEPPHR: 82 {beats}/min
LHR: 0.45
LVDIAVOL: 66 mL (ref 46–106)
LVSYSVOL: 21 mL
Rest HR: 55 {beats}/min
SDS: 5
SRS: 11
SSS: 16
TID: 1.14

## 2017-08-29 MED ORDER — TECHNETIUM TC 99M TETROFOSMIN IV KIT
30.0000 | PACK | Freq: Once | INTRAVENOUS | Status: AC | PRN
  Administered 2017-08-29: 32 via INTRAVENOUS

## 2017-08-29 MED ORDER — REGADENOSON 0.4 MG/5ML IV SOLN
INTRAVENOUS | Status: AC
  Administered 2017-08-29: 0.4 mg via INTRAVENOUS
  Filled 2017-08-29: qty 5

## 2017-08-29 MED ORDER — SODIUM CHLORIDE 0.9% FLUSH
INTRAVENOUS | Status: AC
  Administered 2017-08-29: 10 mL via INTRAVENOUS
  Filled 2017-08-29: qty 10

## 2017-08-29 MED ORDER — TECHNETIUM TC 99M TETROFOSMIN IV KIT
10.0000 | PACK | Freq: Once | INTRAVENOUS | Status: AC | PRN
  Administered 2017-08-29: 10.6 via INTRAVENOUS

## 2017-09-01 ENCOUNTER — Telehealth: Payer: Self-pay | Admitting: *Deleted

## 2017-09-01 NOTE — Telephone Encounter (Signed)
-----   Message from Satira Sark, MD sent at 08/29/2017  4:42 PM EDT ----- Results reviewed.  Overall low risk stress test with evidence of breast tissue attenuation, no definite ischemia and normal LVEF.  Continue with current follow-up plan.  Can see her back sooner if symptoms progress.

## 2017-09-01 NOTE — Telephone Encounter (Signed)
Patient informed and copy sent to PCP. 

## 2017-11-10 ENCOUNTER — Encounter (HOSPITAL_COMMUNITY): Payer: Self-pay | Admitting: Emergency Medicine

## 2017-11-10 ENCOUNTER — Emergency Department (HOSPITAL_COMMUNITY)
Admission: EM | Admit: 2017-11-10 | Discharge: 2017-11-10 | Disposition: A | Discharge status: Home / Self Care | Payer: Medicaid Other | Attending: Emergency Medicine | Admitting: Emergency Medicine

## 2017-11-10 ENCOUNTER — Emergency Department (HOSPITAL_COMMUNITY): Payer: Medicaid Other

## 2017-11-10 DIAGNOSIS — M545 Low back pain, unspecified: Secondary | ICD-10-CM

## 2017-11-10 DIAGNOSIS — M25572 Pain in left ankle and joints of left foot: Secondary | ICD-10-CM | POA: Diagnosis not present

## 2017-11-10 DIAGNOSIS — I10 Essential (primary) hypertension: Secondary | ICD-10-CM | POA: Insufficient documentation

## 2017-11-10 DIAGNOSIS — Z79899 Other long term (current) drug therapy: Secondary | ICD-10-CM | POA: Diagnosis not present

## 2017-11-10 DIAGNOSIS — F17210 Nicotine dependence, cigarettes, uncomplicated: Secondary | ICD-10-CM | POA: Diagnosis not present

## 2017-11-10 MED ORDER — KETOROLAC TROMETHAMINE 30 MG/ML IJ SOLN
30.0000 mg | Freq: Once | INTRAMUSCULAR | Status: AC
  Administered 2017-11-10: 30 mg via INTRAMUSCULAR
  Filled 2017-11-10: qty 1

## 2017-11-10 MED ORDER — NAPROXEN 500 MG PO TABS: 500.0000 mg | ORAL_TABLET | Freq: Two times a day (BID) | ORAL | 0 refills | Status: DC

## 2017-11-10 MED ORDER — METHOCARBAMOL 500 MG PO TABS: 500.0000 mg | ORAL_TABLET | Freq: Two times a day (BID) | ORAL | 0 refills | Status: DC

## 2017-11-10 NOTE — ED Notes (Signed)
Ace wrap to ankle for support, okay with PA, patient requesting. Patient given discharge instruction, verbalized understand. Patient ambulatory out of the department.

## 2017-11-10 NOTE — ED Triage Notes (Signed)
Pt reports moving furniture recently and last night a pile of stuff fell and she injured her left ankle and lower back.

## 2017-11-10 NOTE — ED Provider Notes (Signed)
[facility] Provider Note   CSN: [id number] Arrival date & time: 11/10/17  1506   History   Chief Complaint Chief Complaint  Patient presents with  . Back Pain    HPI Kara Matthews is a 42 y.o. female medical history significant for hypertension, anxiety, aortic stenosis who presents for evaluation of lower back pain left ankle pain.  Patient states she was moving furniture yesterday felt tightness radiate across her lower back throughout the day.  States that when she went down to the basement to move furniture around she had a mattress fall on her side and left ankle.  States after this incident her lower back pain increased.  She rates her back pain a 8/10.  Pain does not radiate.  Denies numbness or tingling in her lower extremities, history of IV drug use, history of malignancy, bowel or bladder incontinence, saddle paresthesias.  States her back pain is better with rest and worse with movement especially twisting.  States she has left lateral ankle pain after the incident.  Pain radiates from her lateral malleolus into the side of her foot.  Pain is worse with standing and walking.  Denies redness, warmth, edema to the ankle.  Has been trying to 800 mg of ibuprofen without relief of pain.   HPI  Past Medical History:  Diagnosis Date  . Anxiety   . Aortic stenosis    Probable bicuspid aortic valve  . Essential hypertension   . Fibromyalgia   . Hyperlipidemia     Patient Active Problem List   Diagnosis Date Noted  . Lack of sexual desire 07/02/2016    Past Surgical History:  Procedure Laterality Date  . ABDOMINAL HYSTERECTOMY    . CESAREAN SECTION    . CHOLECYSTECTOMY       OB History    Gravida  2   Para  2   Term  2   Preterm      AB      Living  2     SAB      TAB      Ectopic      Multiple      Live Births  2            Home Medications    Prior to Admission medications   Medication Sig Start Date End Date  Taking? Authorizing Provider  Aspirin-Salicylamide-Caffeine (BC HEADACHE POWDER PO) Take by mouth as needed.    [provider]  bisoprolol-hydrochlorothiazide (ZIAC) 5-6.25 MG tablet Take 1 tablet by mouth daily.    [provider]  cyclobenzaprine (FLEXERIL) 10 MG tablet Take 1 tablet (10 mg total) by mouth 3 (three) times daily. 08/25/17   Lily Kocher, PA-C  doxycycline (VIBRAMYCIN) 100 MG capsule Take 1 capsule (100 mg total) by mouth 2 (two) times daily. 08/25/17   Lily Kocher, PA-C  estrogens-methylTEST (ESTRATEST) 1.25-2.5 MG tablet Take 1 tablet by mouth daily. 07/02/16   Jonnie Kind, MD  loratadine-pseudoephedrine (CLARITIN-D 12 HOUR) 5-120 MG tablet Take 1 tablet by mouth 2 (two) times daily. 11/17/16   Lily Kocher, PA-C  meloxicam (MOBIC) 15 MG tablet Take 1 tablet (15 mg total) by mouth daily. 08/25/17   Lily Kocher, PA-C  methocarbamol (ROBAXIN) 500 MG tablet Take 1 tablet (500 mg total) by mouth 2 (two) times daily. 11/10/17   Zakari Bathe A, PA-C  naproxen (NAPROSYN) 500 MG tablet Take 1 tablet (500 mg total) by mouth 2 (two) times daily. 11/10/17  Jinan Biggins A, PA-C  nitroGLYCERIN (NITROSTAT) 0.4 MG SL tablet Place 1 tablet (0.4 mg total) every 5 (five) minutes as needed under the tongue for chest pain. 12/24/16   Nat Christen, MD  PREMARIN 1.25 MG tablet TAKE (1) TABLET BY MOUTH ONCE DAILY. 03/26/17   Florian Buff, MD    Family History Family History  Problem Relation Age of Onset  . Stroke Father   . Heart failure Brother   . Stroke Mother     Social History Social History   Tobacco Use  . Smoking status: Current Every Day Smoker    Packs/day: 1.00    Years: 20.00    Pack years: 20.00    Types: Cigarettes  . Smokeless tobacco: Never Used  Substance Use Topics  . Alcohol use: No    Alcohol/week: 0.0 standard drinks  . Drug use: No     Allergies   Bactrim [sulfamethoxazole-trimethoprim]; Ciprofloxacin; Codeine; and Flagyl  [metronidazole]   Review of Systems Review of Systems  Constitutional: Negative for activity change, appetite change, chills, diaphoresis, fatigue, fever and unexpected weight change.  Respiratory: Negative.   Cardiovascular: Negative.   Gastrointestinal: Negative for nausea and vomiting.  Musculoskeletal: Positive for back pain. Negative for gait problem, joint swelling, myalgias, neck pain and neck stiffness.       Left ankle pain  Skin: Negative.   Neurological: Negative for weakness and numbness.     Physical Exam Updated Vital Signs BP (!) 145/87 (BP Location: Right Arm)   Pulse 78   Temp 97.8 F (36.6 C) (Oral)   Resp 17   Ht 4\' 11"  (1.499 m)   Wt 81.6 kg   SpO2 98%   BMI 36.36 kg/m   Physical Exam  Physical Exam  Constitutional: Pt appears well-developed and well-nourished. No distress.  HENT:  Head: Normocephalic and atraumatic.  Mouth/Throat: Oropharynx is clear and moist. No oropharyngeal exudate.  Eyes: Conjunctivae are normal.  Neck: Normal range of motion. Neck supple.  Full ROM without pain  Cardiovascular: Normal rate, regular rhythm and intact distal pulses.   Pulmonary/Chest: Effort normal and breath sounds normal. No respiratory distress. Pt has no wheezes.  Abdominal: Soft. Pt exhibits no distension. There is no tenderness, rebound or guarding. No abd bruit or pulsatile mass Musculoskeletal:  Full range of motion of the T-spine and L-spine with flexion, hyperextension, and lateral flexion. No midline tenderness or stepoffs. No tenderness to palpation of the spinous processes of the T-spine or L-spine. Mild tenderness to palpation of the paraspinous muscles of the L-spine. Negative straight leg raise. Tenderness to palpation left lateral malleolus of left leg.  Full range of motion with plantarflexion and dorsiflexion, inversion and eversion.  5/5 strength. Lymphadenopathy:    Pt has no cervical adenopathy.  Neurological: Pt is alert. Pt has normal  reflexes.  Reflex Scores:      Bicep reflexes are 2+ on the right side and 2+ on the left side.      Brachioradialis reflexes are 2+ on the right side and 2+ on the left side.      Patellar reflexes are 2+ on the right side and 2+ on the left side.      Achilles reflexes are 2+ on the right side and 2+ on the left side. Speech is clear and goal oriented, follows commands Normal 5/5 strength in upper and lower extremities bilaterally including dorsiflexion and plantar flexion, strong and equal grip strength Sensation normal to light and sharp touch Moves extremities  without ataxia, coordination intact Normal gait Normal balance No Clonus Skin: Skin is warm and dry. No rash noted or lesions noted. Pt is not diaphoretic. No erythema, ecchymosis,edema or warmth.  Psychiatric: Pt has a normal mood and affect. Behavior is normal.  Nursing note and vitals reviewed. ED Treatments / Results  Labs (all labs ordered are listed, but only abnormal results are displayed) Labs Reviewed - No data to display  EKG None  Radiology Dg Lumbar Spine Complete  Result Date: 11/10/2017 CLINICAL DATA:  LOW BACK PAIN, LEFT ANKLE PAIN, PER ER NOTE, Pt reports moving furniture recently and last night a pile of stuff fell and she injured her left ankle and lower back. HISTORY OF HTN EXAM: LUMBAR SPINE - COMPLETE 4+ VIEW COMPARISON:  CT of the abdomen and pelvis on 09/10/2017, plain films 03/03/2016 FINDINGS: L5 is partially sacralized. There is no acute fracture or subluxation. No lytic or blastic lesions. Surgical clips are seen in the RIGHT UPPER QUADRANT and LEFT LOWER QUADRANT. IMPRESSION: No evidence for acute  abnormality. Electronically Signed   By: Nolon Nations M.D.   On: 11/10/2017 16:34   Dg Ankle Complete Left  Result Date: 11/10/2017 CLINICAL DATA:  LOW BACK PAIN, LEFT ANKLE PAIN, PER ER NOTE, Pt reports moving furniture recently and last night a pile of stuff fell and she injured her left ankle and  lower back. HISTORY OF HTN EXAM: LEFT ANKLE COMPLETE - 3+ VIEW COMPARISON:  None. FINDINGS: There is no evidence of fracture, dislocation, or joint effusion. There is no evidence of arthropathy or other focal bone abnormality. Soft tissues are unremarkable. IMPRESSION: Negative. Electronically Signed   By: Nolon Nations M.D.   On: 11/10/2017 16:35    Procedures Procedures (including critical care time)  Medications Ordered in ED Medications  ketorolac (TORADOL) 30 MG/ML injection 30 mg (30 mg Intramuscular Given 11/10/17 1553)     Initial Impression / Assessment and Plan / ED Course  I have reviewed the triage vital signs and the nursing notes as well as past medical history.  Pertinent labs & imaging results that were available during my care of the patient were reviewed by me and considered in my medical decision making (see chart for details).  42 year old otherwise healthy female presents for evaluation of low back pain and left leg pain after injury.  Afebrile, nonseptic, non-ill-appearing.  Diffuse lumbar tenderness to palpation, worse with movement.  Left lateral malleolus tenderness to palpation.  Will obtain plain film lumbar spine and left ankle to r/o fracture and dislocation of ankle or injury to lumbar spine.  No concern for cauda equina.  Normal neuro exam without neurologic deficits.  Patient able to ambulate without difficulty.  No IV drug use, history of malignancy, bowel or bladder incontinence, saddle paresthesias, fever, weight loss, history of cancer.  Feel her pain is most likely musculoskeletal in nature at this time.  Will give IM Toradol and reevaluate.   Pain controlled in ED.  Discussed NSAIDs and muscle relaxers for muscle spasms.  Stable for discharge. Discussed strict return precautions with patient.  Patient voiced understanding.     Final Clinical Impressions(s) / ED Diagnoses   Final diagnoses:  Acute bilateral low back pain without sciatica  Acute left  ankle pain    ED Discharge Orders         Ordered    naproxen (NAPROSYN) 500 MG tablet  2 times daily     11/10/17 1644    methocarbamol (ROBAXIN) 500  MG tablet  2 times daily     11/10/17 1644           Taisa Deloria A, PA-C 11/10/17 1703    Elnora Morrison, MD 11/14/17 1721

## 2017-11-10 NOTE — Discharge Instructions (Addendum)
You were evaluated for back pain and leg pain.  Your plain films were negative for fracture or dislocation.  Your pain is most likely musculoskeletal nature.  I will prescribe you naproxen and a muscle relaxer.  Please take as prescribed.  Please do not drive while taking the muscle relaxer.  Follow-up with your primary care provider if your pain is not resolved in 3 days.  Please return to the ED with any new or following symptoms:  Contact a doctor if: You have pain that does not go away with rest or medicine. You have worsening pain that goes down into your legs or buttocks. You have pain that does not get better in one week. You have pain at night. You lose weight. You have a fever or chills. Get help right away if: You cannot control when you poop (bowel movement) or pee (urinate). Your arms or legs feel weak. Your arms or legs lose feeling (numbness). You feel sick to your stomach (nauseous) or throw up (vomit). You have belly (abdominal) pain. You feel like you may pass out (faint).

## 2017-11-21 ENCOUNTER — Other Ambulatory Visit: Payer: Self-pay

## 2017-11-21 ENCOUNTER — Encounter (HOSPITAL_COMMUNITY): Payer: Self-pay | Admitting: Emergency Medicine

## 2017-11-21 ENCOUNTER — Emergency Department (HOSPITAL_COMMUNITY): Payer: Medicaid Other

## 2017-11-21 ENCOUNTER — Emergency Department (HOSPITAL_COMMUNITY)
Admission: EM | Admit: 2017-11-21 | Discharge: 2017-11-21 | Disposition: A | Discharge status: Home / Self Care | Payer: Medicaid Other | Attending: Emergency Medicine | Admitting: Emergency Medicine

## 2017-11-21 DIAGNOSIS — S62337A Displaced fracture of neck of fifth metacarpal bone, left hand, initial encounter for closed fracture: Secondary | ICD-10-CM

## 2017-11-21 DIAGNOSIS — F17210 Nicotine dependence, cigarettes, uncomplicated: Secondary | ICD-10-CM | POA: Insufficient documentation

## 2017-11-21 DIAGNOSIS — Y929 Unspecified place or not applicable: Secondary | ICD-10-CM | POA: Diagnosis not present

## 2017-11-21 DIAGNOSIS — S63632A Sprain of interphalangeal joint of right middle finger, initial encounter: Secondary | ICD-10-CM

## 2017-11-21 DIAGNOSIS — I10 Essential (primary) hypertension: Secondary | ICD-10-CM | POA: Diagnosis not present

## 2017-11-21 DIAGNOSIS — S6982XA Other specified injuries of left wrist, hand and finger(s), initial encounter: Secondary | ICD-10-CM | POA: Diagnosis present

## 2017-11-21 DIAGNOSIS — X58XXXA Exposure to other specified factors, initial encounter: Secondary | ICD-10-CM | POA: Insufficient documentation

## 2017-11-21 DIAGNOSIS — Z79899 Other long term (current) drug therapy: Secondary | ICD-10-CM | POA: Insufficient documentation

## 2017-11-21 DIAGNOSIS — Y9372 Activity, wrestling: Secondary | ICD-10-CM | POA: Diagnosis not present

## 2017-11-21 DIAGNOSIS — Y999 Unspecified external cause status: Secondary | ICD-10-CM | POA: Diagnosis not present

## 2017-11-21 MED ORDER — HYDROCODONE-ACETAMINOPHEN 5-325 MG PO TABS: 1.0000 | ORAL_TABLET | ORAL | 0 refills | Status: DC | PRN

## 2017-11-21 NOTE — ED Provider Notes (Signed)
[facility] EMERGENCY DEPARTMENT Provider Note   CSN: [id number] Arrival date & time: 11/21/17  1646     History   Chief Complaint Chief Complaint  Patient presents with  . Finger Injury    HPI Kara Matthews is a 42 y.o. female.  The history is provided by the patient. No language interpreter was used.  Hand Pain  This is a new problem. The current episode started yesterday. The problem occurs constantly. The problem has been gradually worsening. Nothing aggravates the symptoms. Nothing relieves the symptoms. She has tried nothing for the symptoms. The treatment provided no relief.   Pt reports she was wrestling with her nephew  And injured her right 3rd finger and left hand on ulnar side.  Pt complains of swelling and pain  Past Medical History:  Diagnosis Date  . Anxiety   . Aortic stenosis    Probable bicuspid aortic valve  . Essential hypertension   . Fibromyalgia   . Hyperlipidemia     Patient Active Problem List   Diagnosis Date Noted  . Lack of sexual desire 07/02/2016    Past Surgical History:  Procedure Laterality Date  . ABDOMINAL HYSTERECTOMY    . CESAREAN SECTION    . CHOLECYSTECTOMY       OB History    Gravida  2   Para  2   Term  2   Preterm      AB      Living  2     SAB      TAB      Ectopic      Multiple      Live Births  2            Home Medications    Prior to Admission medications   Medication Sig Start Date End Date Taking? Authorizing Provider  Aspirin-Salicylamide-Caffeine (BC HEADACHE POWDER PO) Take by mouth as needed.    [provider]  bisoprolol-hydrochlorothiazide (ZIAC) 5-6.25 MG tablet Take 1 tablet by mouth daily.    [provider]  cyclobenzaprine (FLEXERIL) 10 MG tablet Take 1 tablet (10 mg total) by mouth 3 (three) times daily. 08/25/17   Lily Kocher, PA-C  doxycycline (VIBRAMYCIN) 100 MG capsule Take 1 capsule (100 mg total) by mouth 2 (two) times daily. 08/25/17   Lily Kocher, PA-C  estrogens-methylTEST (ESTRATEST) 1.25-2.5 MG tablet Take 1 tablet by mouth daily. 07/02/16   Jonnie Kind, MD  HYDROcodone-acetaminophen (NORCO/VICODIN) 5-325 MG tablet Take 1 tablet by mouth every 4 (four) hours as needed. 11/21/17   Fransico Meadow, PA-C  loratadine-pseudoephedrine (CLARITIN-D 12 HOUR) 5-120 MG tablet Take 1 tablet by mouth 2 (two) times daily. 11/17/16   Lily Kocher, PA-C  meloxicam (MOBIC) 15 MG tablet Take 1 tablet (15 mg total) by mouth daily. 08/25/17   Lily Kocher, PA-C  methocarbamol (ROBAXIN) 500 MG tablet Take 1 tablet (500 mg total) by mouth 2 (two) times daily. 11/10/17   Henderly, Britni A, PA-C  naproxen (NAPROSYN) 500 MG tablet Take 1 tablet (500 mg total) by mouth 2 (two) times daily. 11/10/17   Henderly, Britni A, PA-C  nitroGLYCERIN (NITROSTAT) 0.4 MG SL tablet Place 1 tablet (0.4 mg total) every 5 (five) minutes as needed under the tongue for chest pain. 12/24/16   Nat Christen, MD  PREMARIN 1.25 MG tablet TAKE (1) TABLET BY MOUTH ONCE DAILY. 03/26/17   Florian Buff, MD    Family History Family History  Problem Relation Age  of Onset  . Stroke Father   . Heart failure Brother   . Stroke Mother     Social History Social History   Tobacco Use  . Smoking status: Current Every Day Smoker    Packs/day: 1.00    Years: 20.00    Pack years: 20.00    Types: Cigarettes  . Smokeless tobacco: Never Used  Substance Use Topics  . Alcohol use: No    Alcohol/week: 0.0 standard drinks  . Drug use: No     Allergies   Bactrim [sulfamethoxazole-trimethoprim]; Ciprofloxacin; Codeine; and Flagyl [metronidazole]   Review of Systems Review of Systems  Musculoskeletal: Positive for myalgias.  All other systems reviewed and are negative.    Physical Exam Updated Vital Signs BP 132/76 (BP Location: Right Arm)   Pulse 75   Temp 98.6 F (37 C) (Oral)   Resp 18   Ht 4\' 11"  (1.499 m)   Wt 81.6 kg   SpO2 96%   BMI 36.36 kg/m   Physical  Exam  Constitutional: She appears well-developed and well-nourished.  HENT:  Head: Normocephalic.  Cardiovascular: Normal rate.  Pulmonary/Chest: Effort normal.  Musculoskeletal: Normal range of motion. She exhibits tenderness.  Neurological: She is alert.  Skin: Skin is warm.  Psychiatric: She has a normal mood and affect.  Nursing note and vitals reviewed.    ED Treatments / Results  Labs (all labs ordered are listed, but only abnormal results are displayed) Labs Reviewed - No data to display  EKG None  Radiology Dg Hand Complete Left  Result Date: 11/21/2017 CLINICAL DATA:  Injuries to the left hand and right long finger while wrestling with a nephew. Initial encounter. EXAM: LEFT HAND - COMPLETE 3+ VIEW COMPARISON:  None. FINDINGS: There is an oblique curvilinear lucency through the midportion of the shaft of the fifth metacarpal without definite cortical disruption and without displacement. Overlying soft tissue swelling is noted. There is no evidence of fracture elsewhere. There is no dislocation. IMPRESSION: Suspected nondisplaced fracture of the fifth metacarpal versus prominent vascular channel. Electronically Signed   By: Logan Bores M.D.   On: 11/21/2017 17:52   Dg Hand Complete Right  Result Date: 11/21/2017 CLINICAL DATA:  Right long finger injury while wrestling with nephew. Initial encounter. EXAM: RIGHT HAND - COMPLETE 3+ VIEW COMPARISON:  Right wrist radiographs 04/10/2003 FINDINGS: There is no evidence of fracture or dislocation. There is no evidence of arthropathy or other focal bone abnormality. Soft tissues are unremarkable. IMPRESSION: Negative. Electronically Signed   By: Logan Bores M.D.   On: 11/21/2017 17:54    Procedures Procedures (including critical care time)  Medications Ordered in ED Medications - No data to display   Initial Impression / Assessment and Plan / ED Course  I have reviewed the triage vital signs and the nursing  notes.  Pertinent labs & imaging results that were available during my care of the patient were reviewed by me and considered in my medical decision making (see chart for details).     MDM  Pt counseled on results.  Pt advised to follow up with Dr. Aline Brochure for recheck next week.   Final Clinical Impressions(s) / ED Diagnoses   Final diagnoses:  Closed displaced fracture of neck of fifth metacarpal bone of left hand, initial encounter  Sprain of interphalangeal joint of right middle finger, initial encounter    ED Discharge Orders         Ordered    HYDROcodone-acetaminophen (NORCO/VICODIN) 5-325 MG tablet  Every 4 hours PRN     11/21/17 1825        An After Visit Summary was printed and given to the patient.    Sidney Ace 11/21/17 2128    Merrily Pew, MD 11/21/17 2249

## 2017-11-21 NOTE — ED Notes (Signed)
Splint applied to wrist and finger, on complaints, Patient given discharge instruction, verbalized understand. Patient ambulatory out of the department.

## 2017-11-21 NOTE — ED Triage Notes (Addendum)
Pt was wrestling with nephew and hurt her right middle finger and left hand. Bruising noted.

## 2017-11-25 ENCOUNTER — Encounter: Payer: Self-pay | Admitting: Orthopaedic Surgery

## 2017-11-25 ENCOUNTER — Ambulatory Visit: Payer: Medicaid Other | Admitting: Orthopaedic Surgery

## 2017-11-25 VITALS — BP 151/100 | HR 75 | Ht 59.0 in | Wt 178.0 lb

## 2017-11-25 DIAGNOSIS — S66911A Strain of unspecified muscle, fascia and tendon at wrist and hand level, right hand, initial encounter: Secondary | ICD-10-CM | POA: Diagnosis not present

## 2017-11-25 DIAGNOSIS — F17210 Nicotine dependence, cigarettes, uncomplicated: Secondary | ICD-10-CM | POA: Diagnosis not present

## 2017-11-25 DIAGNOSIS — S62357A Nondisplaced fracture of shaft of fifth metacarpal bone, left hand, initial encounter for closed fracture: Secondary | ICD-10-CM

## 2017-11-25 DIAGNOSIS — IMO0001 Reserved for inherently not codable concepts without codable children: Secondary | ICD-10-CM

## 2017-11-25 MED ORDER — HYDROCODONE-ACETAMINOPHEN 5-325 MG PO TABS: ORAL_TABLET | ORAL | 0 refills | Status: DC
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
## 2017-12-04 ENCOUNTER — Telehealth: Payer: Self-pay | Admitting: Orthopaedic Surgery

## 2017-12-04 NOTE — Telephone Encounter (Signed)
Patient of Dr Brooke Bonito under fracture care (has been notified that Dr Luna Glasgow is out of office until 12/16/17) and also of office policy that refill requests are to be received before noon on Thursdays. Voiced understanding. Requests refill: ([facility])  HYDROcodone-acetaminophen (NORCO/VICODIN) 5-325 MG tablet 30 tablet 0 11/25/2017    Sig: One tablet every four hours as needed for acute pain. Limit of five days per [place] statue.

## 2017-12-10 ENCOUNTER — Other Ambulatory Visit: Payer: Self-pay | Admitting: Orthopedic Surgery

## 2017-12-10 MED ORDER — HYDROCODONE-ACETAMINOPHEN 5-325 MG PO TABS: 1.0000 | ORAL_TABLET | ORAL | 0 refills | Status: DC | PRN

## 2017-12-10 NOTE — Telephone Encounter (Signed)
Please assist. Thanks.

## 2017-12-11 ENCOUNTER — Other Ambulatory Visit: Payer: Self-pay | Admitting: Orthopedic Surgery

## 2017-12-11 MED ORDER — HYDROCODONE-ACETAMINOPHEN 5-325 MG PO TABS: 1.0000 | ORAL_TABLET | ORAL | 0 refills | Status: DC | PRN

## 2017-12-16 ENCOUNTER — Encounter: Payer: Self-pay | Admitting: Orthopaedic Surgery

## 2017-12-16 ENCOUNTER — Ambulatory Visit (INDEPENDENT_AMBULATORY_CARE_PROVIDER_SITE_OTHER): Payer: Medicaid Other | Admitting: Orthopaedic Surgery

## 2017-12-16 ENCOUNTER — Ambulatory Visit (INDEPENDENT_AMBULATORY_CARE_PROVIDER_SITE_OTHER): Payer: Medicaid Other

## 2017-12-16 DIAGNOSIS — S62357D Nondisplaced fracture of shaft of fifth metacarpal bone, left hand, subsequent encounter for fracture with routine healing: Secondary | ICD-10-CM

## 2017-12-16 MED ORDER — HYDROCODONE-ACETAMINOPHEN 5-325 MG PO TABS: ORAL_TABLET | ORAL | 0 refills | Status: DC

## 2017-12-16 NOTE — Progress Notes (Signed)
CC:  My hand is sore  She had the splint removed.  NV intact. ROM of the little finger left tender.  X-rays were done of the left hand, reported separately.  Encounter Diagnosis  Name Primary?  . Closed nondisplaced fracture of shaft of fifth metacarpal bone of left hand with routine healing, subsequent encounter Yes   A [place] splint given with instructions.  Return in two weeks.  X-rays then.  Call if any problem.  Precautions discussed.  I have reviewed the [place] web site prior to prescribing narcotic medicine for this patient.  Electronically Signed Sanjuana Kava, MD 10/29/20193:38 PM

## 2017-12-18 ENCOUNTER — Telehealth: Payer: Self-pay | Admitting: Orthopaedic Surgery

## 2017-12-18 NOTE — Telephone Encounter (Signed)
Patient called, relays that since the smaller brace was placed on hand at time of visit yesterday, hand is hurting badly and swollen. Please advise.

## 2017-12-18 NOTE — Telephone Encounter (Signed)
Spoke with pt and let her know no providers are in the office at this time. Gave patient information on Icing and elevating the hand to help with swelling. Advised I will ask a provider tomorrow for any different treatment plans and I will call her afterwards.

## 2017-12-24 NOTE — Telephone Encounter (Signed)
Spoke with pt today states hand is much better in the brace.

## 2017-12-25 ENCOUNTER — Telehealth: Payer: Self-pay | Admitting: Orthopaedic Surgery

## 2017-12-25 NOTE — Telephone Encounter (Signed)
Patient requests refill: HYDROcodone-acetaminophen (NORCO/VICODIN) 5-325 MG tablet 28 tablet  -[place]

## 2017-12-25 NOTE — Telephone Encounter (Signed)
Done

## 2017-12-30 ENCOUNTER — Ambulatory Visit (INDEPENDENT_AMBULATORY_CARE_PROVIDER_SITE_OTHER): Payer: Medicaid Other | Admitting: Orthopaedic Surgery

## 2017-12-30 ENCOUNTER — Ambulatory Visit (INDEPENDENT_AMBULATORY_CARE_PROVIDER_SITE_OTHER): Payer: Medicaid Other

## 2017-12-30 ENCOUNTER — Encounter: Payer: Self-pay | Admitting: Orthopaedic Surgery

## 2017-12-30 DIAGNOSIS — F17210 Nicotine dependence, cigarettes, uncomplicated: Secondary | ICD-10-CM

## 2017-12-30 DIAGNOSIS — S62357D Nondisplaced fracture of shaft of fifth metacarpal bone, left hand, subsequent encounter for fracture with routine healing: Secondary | ICD-10-CM

## 2017-12-30 NOTE — Telephone Encounter (Signed)
No more narcotics.  Take Advil, Aleve or Tylenol.

## 2017-12-30 NOTE — Telephone Encounter (Signed)
Called patient to notify. Left message to return call.

## 2017-12-30 NOTE — Patient Instructions (Signed)
Steps to Quit Smoking Smoking tobacco can be bad for your health. It can also affect almost every organ in your body. Smoking puts you and people around you at risk for many serious long-lasting (chronic) diseases. Quitting smoking is hard, but it is one of the best things that you can do for your health. It is never too late to quit. What are the benefits of quitting smoking? When you quit smoking, you lower your risk for getting serious diseases and conditions. They can include:  Lung cancer or lung disease.  Heart disease.  Stroke.  Heart attack.  Not being able to have children (infertility).  Weak bones (osteoporosis) and broken bones (fractures).  If you have coughing, wheezing, and shortness of breath, those symptoms may get better when you quit. You may also get sick less often. If you are pregnant, quitting smoking can help to lower your chances of having a baby of low birth weight. What can I do to help me quit smoking? Talk with your doctor about what can help you quit smoking. Some things you can do (strategies) include:  Quitting smoking totally, instead of slowly cutting back how much you smoke over a period of time.  Going to in-person counseling. You are more likely to quit if you go to many counseling sessions.  Using resources and support systems, such as: ? Online chats with a counselor. ? Phone quitlines. ? Printed self-help materials. ? Support groups or group counseling. ? Text messaging programs. ? Mobile phone apps or applications.  Taking medicines. Some of these medicines may have nicotine in them. If you are pregnant or breastfeeding, do not take any medicines to quit smoking unless your doctor says it is okay. Talk with your doctor about counseling or other things that can help you.  Talk with your doctor about using more than one strategy at the same time, such as taking medicines while you are also going to in-person counseling. This can help make  quitting easier. What things can I do to make it easier to quit? Quitting smoking might feel very hard at first, but there is a lot that you can do to make it easier. Take these steps:  Talk to your family and friends. Ask them to support and encourage you.  Call phone quitlines, reach out to support groups, or work with a counselor.  Ask people who smoke to not smoke around you.  Avoid places that make you want (trigger) to smoke, such as: ? Bars. ? Parties. ? Smoke-break areas at work.  Spend time with people who do not smoke.  Lower the stress in your life. Stress can make you want to smoke. Try these things to help your stress: ? Getting regular exercise. ? Deep-breathing exercises. ? Yoga. ? Meditating. ? Doing a body scan. To do this, close your eyes, focus on one area of your body at a time from head to toe, and notice which parts of your body are tense. Try to relax the muscles in those areas.  Download or buy apps on your mobile phone or tablet that can help you stick to your quit plan. There are many free apps, such as QuitGuide from the CDC (Centers for Disease Control and Prevention). You can find more support from smokefree.gov and other websites.  This information is not intended to replace advice given to you by your health care provider. Make sure you discuss any questions you have with your health care provider. Document Released: 12/01/2008 Document   Revised: 10/03/2015 Document Reviewed: 06/21/2014 Elsevier Interactive Patient Education  2018 Elsevier Inc.  

## 2017-12-30 NOTE — Progress Notes (Signed)
CC:  My hand does not hurt  She has done well with the left hand.  She has no pain.  X-rays were done of the left hand, reported separately.  Encounter Diagnoses  Name Primary?  . Closed nondisplaced fracture of shaft of fifth metacarpal bone of left hand with routine healing, subsequent encounter Yes  . Cigarette nicotine dependence without complication    Discharge.  Call if any problem.  Precautions discussed.   Electronically Signed Sanjuana Kava, MD 11/12/20199:04 AM

## 2018-02-04 ENCOUNTER — Emergency Department (HOSPITAL_COMMUNITY)
Admission: EM | Admit: 2018-02-04 | Discharge: 2018-02-04 | Disposition: A | Discharge status: Home / Self Care | Payer: Medicaid Other | Attending: Emergency Medicine | Admitting: Emergency Medicine

## 2018-02-04 ENCOUNTER — Encounter (HOSPITAL_COMMUNITY): Payer: Self-pay

## 2018-02-04 ENCOUNTER — Other Ambulatory Visit: Payer: Self-pay

## 2018-02-04 ENCOUNTER — Emergency Department (HOSPITAL_COMMUNITY): Payer: Medicaid Other

## 2018-02-04 DIAGNOSIS — F17210 Nicotine dependence, cigarettes, uncomplicated: Secondary | ICD-10-CM | POA: Insufficient documentation

## 2018-02-04 DIAGNOSIS — I10 Essential (primary) hypertension: Secondary | ICD-10-CM | POA: Insufficient documentation

## 2018-02-04 DIAGNOSIS — Z79899 Other long term (current) drug therapy: Secondary | ICD-10-CM | POA: Insufficient documentation

## 2018-02-04 DIAGNOSIS — J40 Bronchitis, not specified as acute or chronic: Secondary | ICD-10-CM | POA: Diagnosis not present

## 2018-02-04 DIAGNOSIS — J9801 Acute bronchospasm: Secondary | ICD-10-CM | POA: Insufficient documentation

## 2018-02-04 DIAGNOSIS — R05 Cough: Secondary | ICD-10-CM | POA: Diagnosis present

## 2018-02-04 DIAGNOSIS — J209 Acute bronchitis, unspecified: Secondary | ICD-10-CM

## 2018-02-04 MED ORDER — AZITHROMYCIN 250 MG PO TABS: 250.0000 mg | ORAL_TABLET | Freq: Every day | ORAL | 0 refills | Status: DC

## 2018-02-04 MED ORDER — ALBUTEROL SULFATE (2.5 MG/3ML) 0.083% IN NEBU
5.0000 mg | INHALATION_SOLUTION | Freq: Once | RESPIRATORY_TRACT | Status: AC
  Administered 2018-02-04: 5 mg via RESPIRATORY_TRACT
  Filled 2018-02-04: qty 6

## 2018-02-04 MED ORDER — ALBUTEROL (5 MG/ML) CONTINUOUS INHALATION SOLN
10.0000 mg/h | INHALATION_SOLUTION | RESPIRATORY_TRACT | Status: DC
  Administered 2018-02-04: 10 mg/h via RESPIRATORY_TRACT
  Filled 2018-02-04: qty 20

## 2018-02-04 MED ORDER — METHYLPREDNISOLONE SODIUM SUCC 125 MG IJ SOLR
125.0000 mg | Freq: Once | INTRAMUSCULAR | Status: AC
  Administered 2018-02-04: 125 mg via INTRAMUSCULAR
  Filled 2018-02-04: qty 2

## 2018-02-04 MED ORDER — PREDNISONE 20 MG PO TABS: ORAL_TABLET | ORAL | 0 refills | Status: DC

## 2018-02-04 NOTE — ED Provider Notes (Signed)
[name] Provider Note   CSN: [id number] Arrival date & time: 02/04/18  0901     History   Chief Complaint Chief Complaint  Patient presents with  . Cough    HPI Kara Matthews is a 42 y.o. female.  HPI Patient has had 1 week of nasal congestion, subjective fevers and chills, coughing with occasional production of green sputum, wheezing and chest pain worse with coughing.  She was started on Zyrtec nasal spray by her primary physician.  She is been using her rescue inhaler with little relief.  No new rashes.  No new lower extremity swelling or pain. Past Medical History:  Diagnosis Date  . Anxiety   . Aortic stenosis    Probable bicuspid aortic valve  . Essential hypertension   . Fibromyalgia   . Hyperlipidemia     Patient Active Problem List   Diagnosis Date Noted  . Lack of sexual desire 07/02/2016    Past Surgical History:  Procedure Laterality Date  . ABDOMINAL HYSTERECTOMY    . CESAREAN SECTION    . CHOLECYSTECTOMY       OB History    Gravida  2   Para  2   Term  2   Preterm      AB      Living  2     SAB      TAB      Ectopic      Multiple      Live Births  2            Home Medications    Prior to Admission medications   Medication Sig Start Date End Date Taking? Authorizing Provider  bisoprolol-hydrochlorothiazide (ZIAC) 5-6.25 MG tablet Take 1 tablet by mouth daily.   Yes [provider]  cetirizine (ZYRTEC) 10 MG tablet Take 10 mg by mouth daily.   Yes [provider]  estrogens-methylTEST (ESTRATEST) 1.25-2.5 MG tablet Take 1 tablet by mouth daily. 07/02/16  Yes Jonnie Kind, MD  gemfibrozil (LOPID) 600 MG tablet Take 600 mg by mouth 2 (two) times daily before a meal.   Yes [provider]  PREMARIN 1.25 MG tablet TAKE (1) TABLET BY MOUTH ONCE DAILY. 03/26/17  Yes Florian Buff, MD  Aspirin-Salicylamide-Caffeine (BC HEADACHE POWDER PO) Take by mouth as needed.     [provider]  azithromycin (ZITHROMAX) 250 MG tablet Take 1 tablet (250 mg total) by mouth daily. Take first 2 tablets together, then 1 every day until finished. 02/04/18   Julianne Rice, MD  naproxen (NAPROSYN) 500 MG tablet Take 1 tablet (500 mg total) by mouth 2 (two) times daily. 11/10/17   Henderly, Britni A, PA-C  nitroGLYCERIN (NITROSTAT) 0.4 MG SL tablet Place 1 tablet (0.4 mg total) every 5 (five) minutes as needed under the tongue for chest pain. 12/24/16   Nat Christen, MD  predniSONE (DELTASONE) 20 MG tablet 3 tabs po day one, then 2 po daily x 4 days 02/05/18   Julianne Rice, MD    Family History Family History  Problem Relation Age of Onset  . Stroke Father   . Heart failure Brother   . Stroke Mother   . High blood pressure Mother     Social History Social History   Tobacco Use  . Smoking status: Current Every Day Smoker    Packs/day: 1.00    Years: 20.00    Pack years: 20.00    Types: Cigarettes  . Smokeless tobacco:  Never Used  Substance Use Topics  . Alcohol use: No    Alcohol/week: 0.0 standard drinks  . Drug use: No     Allergies   Bactrim [sulfamethoxazole-trimethoprim]; Ciprofloxacin; Codeine; and Flagyl [metronidazole]   Review of Systems Review of Systems  Constitutional: Positive for chills, fatigue and fever.  HENT: Positive for congestion. Negative for sinus pressure and sore throat.   Respiratory: Positive for cough, shortness of breath and wheezing.   Cardiovascular: Positive for chest pain. Negative for palpitations and leg swelling.  Gastrointestinal: Negative for abdominal pain, constipation, diarrhea, nausea and vomiting.  Genitourinary: Negative for dysuria, flank pain and frequency.  Musculoskeletal: Negative for back pain, myalgias, neck pain and neck stiffness.  Skin: Negative for rash and wound.  Neurological: Negative for dizziness, weakness, light-headedness, numbness and headaches.  All other systems reviewed and  are negative.    Physical Exam Updated Vital Signs BP 123/80   Pulse 73   Temp 98 F (36.7 C) (Oral)   Resp 20   Ht 4\' 11"  (1.499 m)   Wt 79.4 kg   SpO2 96%   BMI 35.35 kg/m   Physical Exam Vitals signs and nursing note reviewed.  Constitutional:      General: She is not in acute distress.    Appearance: Normal appearance. She is well-developed. She is not ill-appearing.  HENT:     Head: Normocephalic and atraumatic.     Nose: Congestion present.     Mouth/Throat:     Mouth: Mucous membranes are moist.     Pharynx: No oropharyngeal exudate or posterior oropharyngeal erythema.  Eyes:     Extraocular Movements: Extraocular movements intact.     Conjunctiva/sclera: Conjunctivae normal.     Pupils: Pupils are equal, round, and reactive to light.  Neck:     Musculoskeletal: Normal range of motion and neck supple. No neck rigidity or muscular tenderness.     Vascular: No carotid bruit.  Cardiovascular:     Rate and Rhythm: Normal rate and regular rhythm.     Heart sounds: No murmur.  Pulmonary:     Effort: Pulmonary effort is normal.     Breath sounds: Wheezing present.     Comments: Expiratory wheezing throughout.  Anterior chest wall tenderness with palpation. Chest:     Chest wall: Tenderness present.  Abdominal:     General: Bowel sounds are normal.     Palpations: Abdomen is soft.     Tenderness: There is no abdominal tenderness. There is no guarding or rebound.  Musculoskeletal: Normal range of motion.        General: No tenderness.     Comments: No midline thoracic or lumbar tenderness.  No lower extremity swelling, asymmetry or tenderness.  Distal pulses intact.  Lymphadenopathy:     Cervical: No cervical adenopathy.  Skin:    General: Skin is warm and dry.     Findings: No erythema or rash.  Neurological:     Mental Status: She is alert and oriented to person, place, and time.  Psychiatric:        Mood and Affect: Mood normal.        Behavior: Behavior  normal.      ED Treatments / Results  Labs (all labs ordered are listed, but only abnormal results are displayed) Labs Reviewed - No data to display  EKG None  Radiology Dg Chest 2 View  Result Date: 02/04/2018 CLINICAL DATA:  Cough, shortness of breath EXAM: CHEST - 2 VIEW COMPARISON:  01/29/2017 FINDINGS: Mild peribronchial thickening. Increased markings in the lung bases. Findings likely reflect bronchitis. Heart is normal size. No confluent opacities or effusions. No acute bony abnormality. IMPRESSION: Mild bronchitic changes. Electronically Signed   By: Rolm Baptise M.D.   On: 02/04/2018 10:17    Procedures Procedures (including critical care time)  Medications Ordered in ED Medications  albuterol (PROVENTIL,VENTOLIN) solution continuous neb (10 mg/hr Nebulization New Bag/Given 02/04/18 1308)  methylPREDNISolone sodium succinate (SOLU-MEDROL) 125 mg/2 mL injection 125 mg (125 mg Intramuscular Given 02/04/18 1146)  albuterol (PROVENTIL) (2.5 MG/3ML) 0.083% nebulizer solution 5 mg (5 mg Nebulization Given 02/04/18 1103)     Initial Impression / Assessment and Plan / ED Course  I have reviewed the triage vital signs and the nursing notes.  Pertinent labs & imaging results that were available during my care of the patient were reviewed by me and considered in my medical decision making (see chart for details).     Wheezing is improved after nebulized treatment x2.  Evidence of bronchitis on chest x-ray.  Given prolonged course will give trial of antibiotics.  We will also give short course of steroids.  Return precautions given.  Final Clinical Impressions(s) / ED Diagnoses   Final diagnoses:  Bronchitis with bronchospasm    ED Discharge Orders         Ordered    predniSONE (DELTASONE) 20 MG tablet     02/04/18 1411    azithromycin (ZITHROMAX) 250 MG tablet  Daily     02/04/18 1411           Julianne Rice, MD 02/04/18 1411

## 2018-02-04 NOTE — ED Triage Notes (Signed)
Pt reports cough, productive at times with green sputum, chills, and pain in ribs with coughing.  Was seen at health dept last Thursday and was given zyrtec nasal spray.  PT reports is no better.

## 2018-04-09 ENCOUNTER — Other Ambulatory Visit: Payer: Self-pay | Admitting: Obstetrics & Gynecology

## 2018-05-08 IMAGING — DX DG LUMBAR SPINE COMPLETE 4+V
5 series · 5 of 5 positions shown · non-contrast
Comparison: None.

CLINICAL DATA: Low back pain.  No trauma.

EXAM:
LUMBAR SPINE - COMPLETE 4+ VIEW

[l-spine ap]
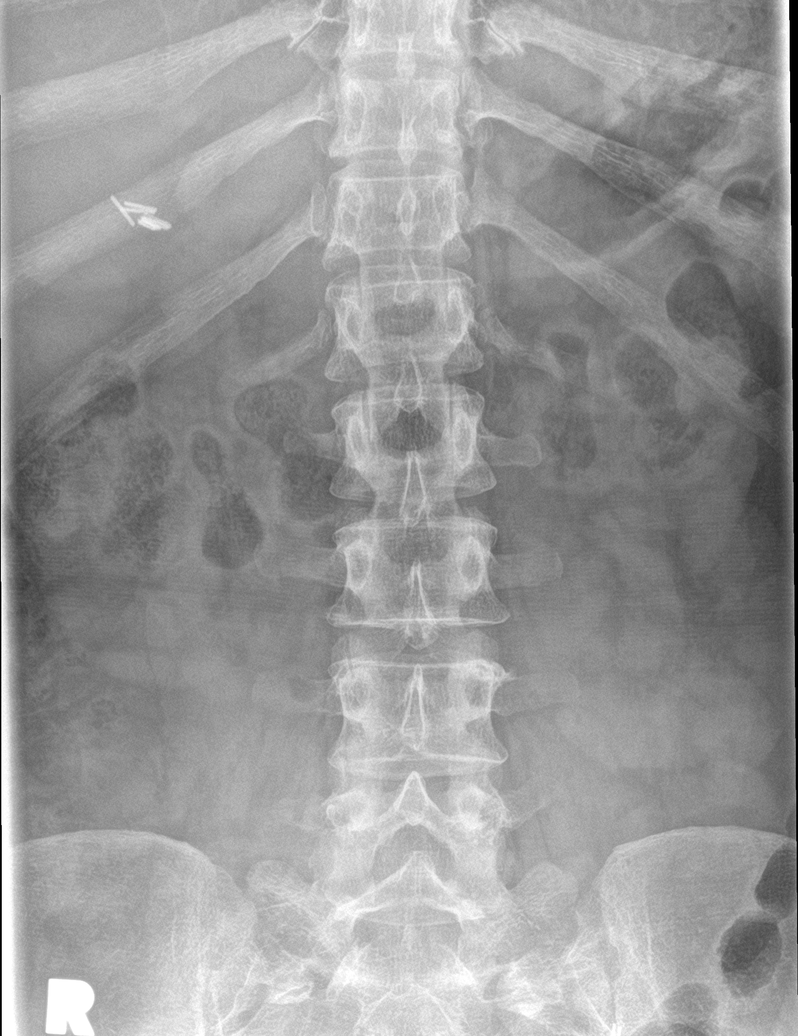

[l-spine obl (1 of 2)]
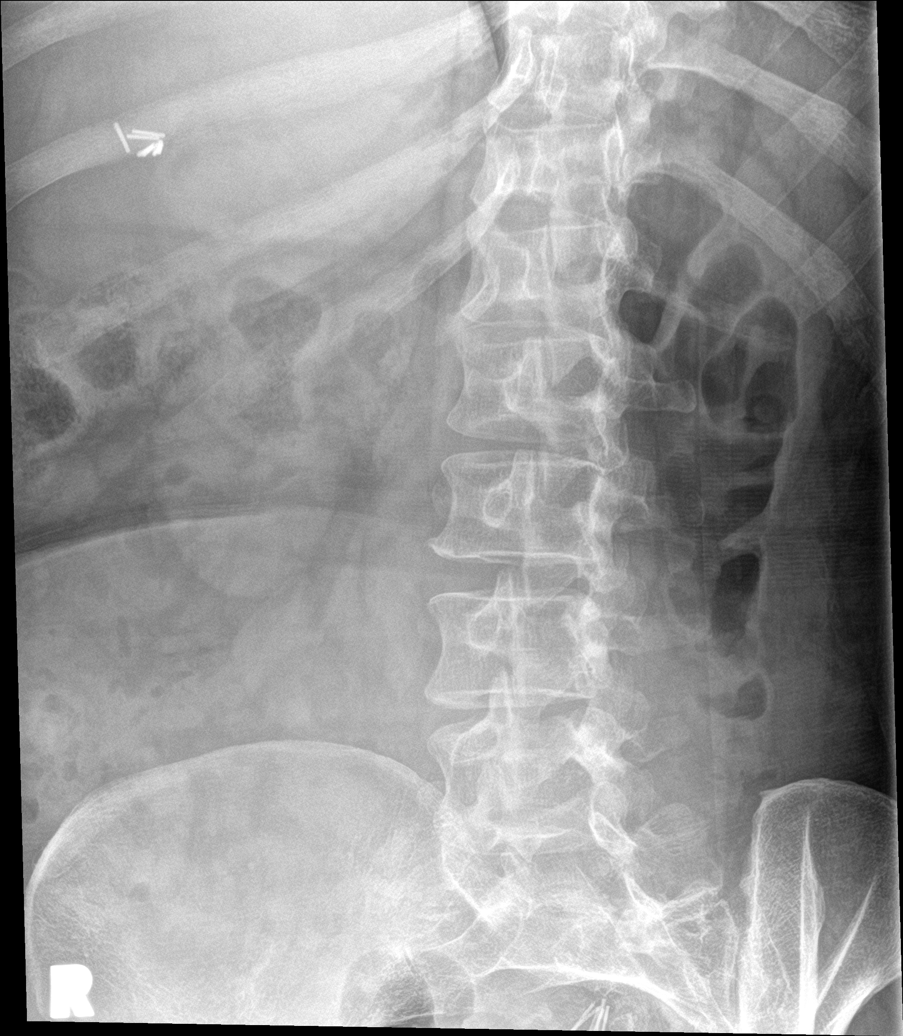

[l-spine obl (2 of 2)]
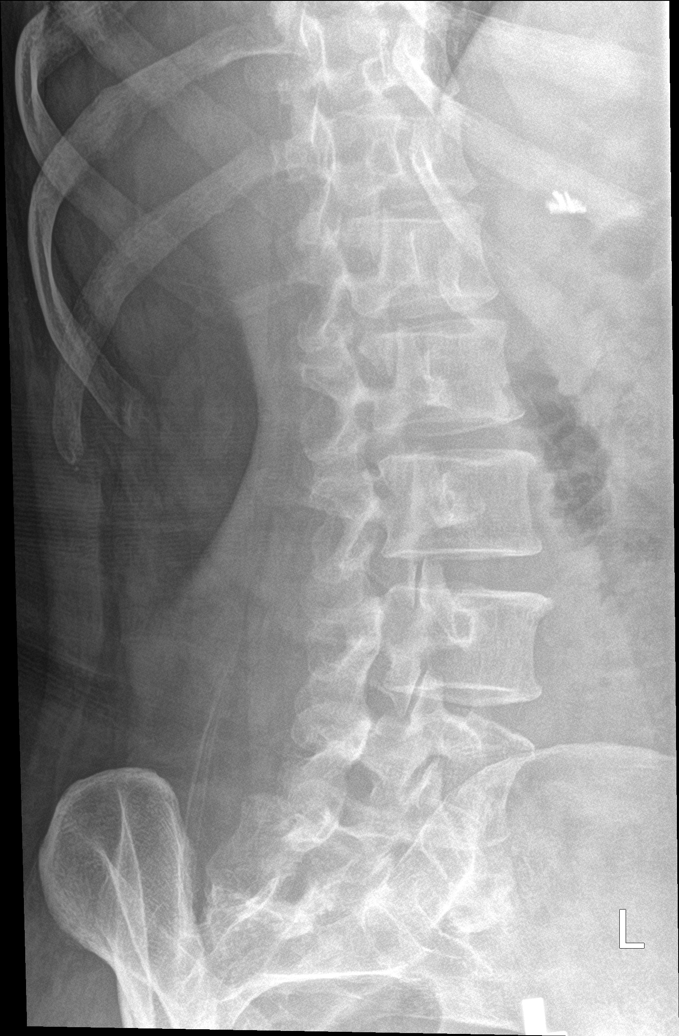

[l-spine spot]
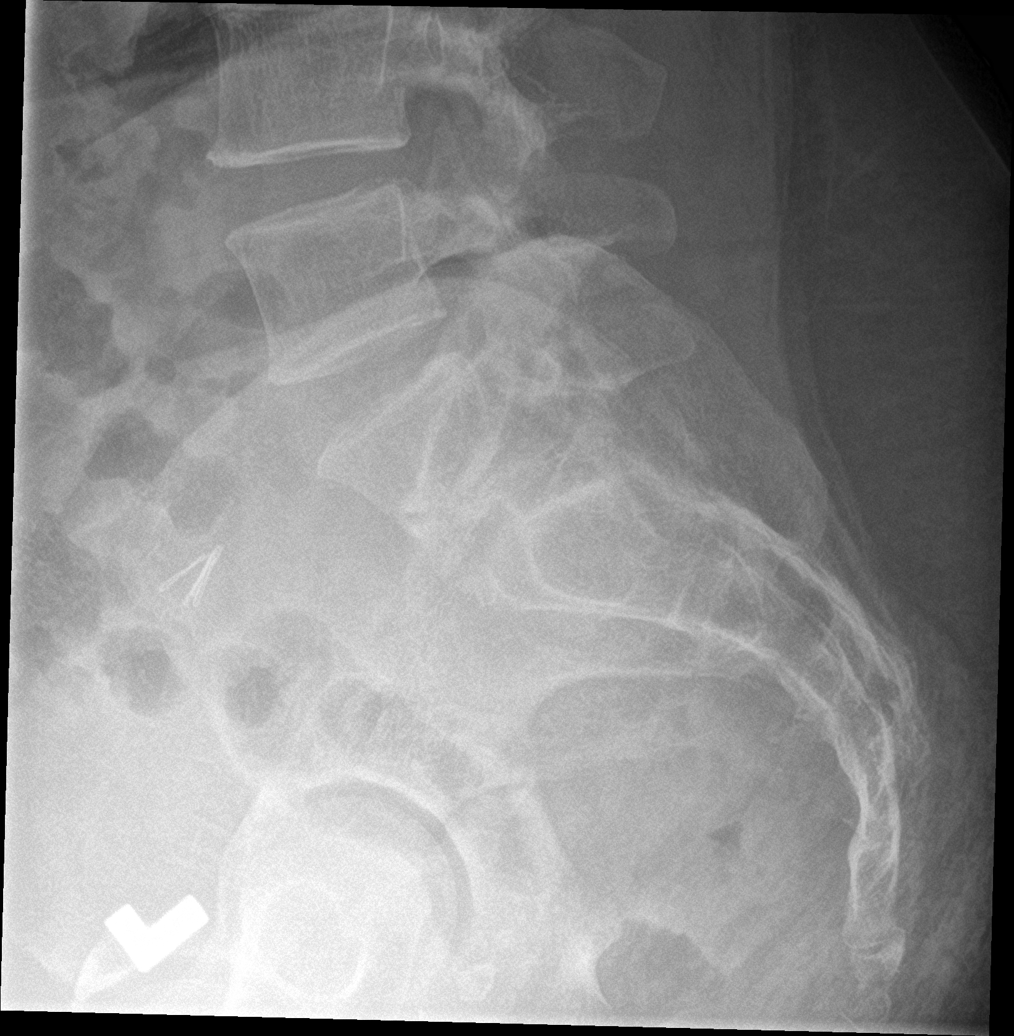

[l-spine lat]
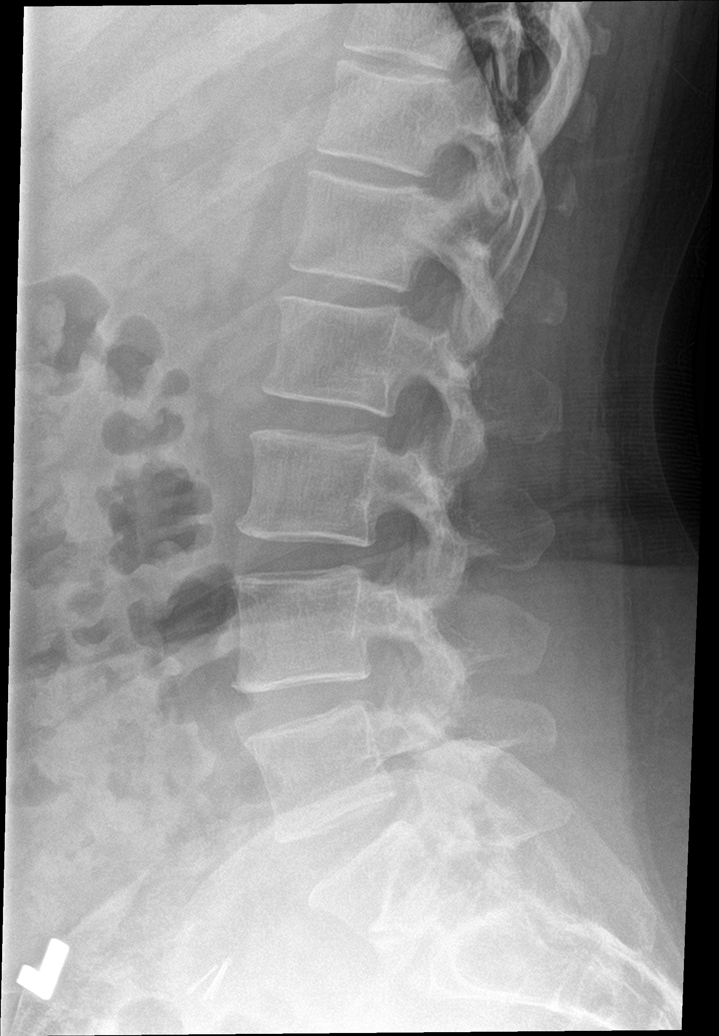

[5 of 5 positions shown; findings below may reference images not displayed]

FINDINGS: Transitional anatomy at L5-S1 with assimilation joints. No fractures
or malalignment.
IMPRESSION: Negative.

## 2018-06-14 ENCOUNTER — Encounter: Payer: Self-pay | Admitting: Orthopaedic Surgery

## 2018-06-14 ENCOUNTER — Encounter: Payer: Self-pay | Admitting: Orthopedic Surgery

## 2018-08-26 ENCOUNTER — Ambulatory Visit (INDEPENDENT_AMBULATORY_CARE_PROVIDER_SITE_OTHER): Payer: Medicaid Other

## 2018-08-26 DIAGNOSIS — I350 Nonrheumatic aortic (valve) stenosis: Secondary | ICD-10-CM

## 2018-08-28 ENCOUNTER — Telehealth: Payer: Self-pay | Admitting: *Deleted

## 2018-08-28 NOTE — Telephone Encounter (Signed)
-----   Message from Satira Sark, MD sent at 08/26/2018  2:50 PM EDT ----- Results reviewed.  LVEF remains normal at 60 to 65%.  Bicuspid aortic valve shows relatively stable mild to moderate range stenosis.  There is also moderate mitral regurgitation.  Please make sure that she has follow-up pending for review.

## 2018-08-28 NOTE — Telephone Encounter (Signed)
Patient informed and verbalized understanding

## 2018-11-02 ENCOUNTER — Other Ambulatory Visit: Payer: Self-pay

## 2018-11-02 ENCOUNTER — Encounter: Payer: Self-pay | Admitting: Cardiology

## 2018-11-02 ENCOUNTER — Telehealth: Payer: Self-pay | Admitting: Cardiology

## 2018-11-02 ENCOUNTER — Ambulatory Visit (INDEPENDENT_AMBULATORY_CARE_PROVIDER_SITE_OTHER): Payer: Medicaid Other | Admitting: Cardiology

## 2018-11-02 VITALS — BP 130/81 | HR 56 | Ht 59.0 in | Wt 167.2 lb

## 2018-11-02 DIAGNOSIS — E782 Mixed hyperlipidemia: Secondary | ICD-10-CM | POA: Diagnosis not present

## 2018-11-02 DIAGNOSIS — Z720 Tobacco use: Secondary | ICD-10-CM

## 2018-11-02 DIAGNOSIS — R0789 Other chest pain: Secondary | ICD-10-CM

## 2018-11-02 DIAGNOSIS — I350 Nonrheumatic aortic (valve) stenosis: Secondary | ICD-10-CM

## 2018-11-02 DIAGNOSIS — R002 Palpitations: Secondary | ICD-10-CM

## 2018-11-02 NOTE — Telephone Encounter (Signed)
3 day Zio patch - palps

## 2018-11-02 NOTE — Progress Notes (Signed)
Cardiology Office Note  Date: 11/02/2018   ID: Kara Matthews, DOB 1975-04-12, MRN [id number]  PCP:  Raiford Simmonds., PA-C  Cardiologist:  Rozann Lesches, MD Electrophysiologist:  None   Chief Complaint  Patient presents with  . Cardiac follow-up    History of Present Illness: Kara Matthews is a 43 y.o. female last seen in July 2019.  She presents for a routine visit.  From a symptom perspective she describes two different issues.  On the one hand she has been experiencing a sense of rapid palpitations, sometimes during the daytime, has woken up in the morning with it as well.  She cannot identify any trigger.  She also reports intermittent exertional fatigue and right-sided chest discomfort.  She has had no syncope.  Follow-up echocardiogram in July of this year revealed LVEF 60 to 65% with bicuspid aortic valve and mild to moderate aortic stenosis, also moderate mitral regurgitation.  Reviewed these results with her today.  I personally reviewed her ECG today which shows sinus bradycardia with signal loss in V4, nonspecific T wave changes.  We went over her medications.  She was taken off of gemfibrozil and started on fenofibrate per PCP.  Patient states that her "bad cholesterol" was in the 500s, I wonder whether she meant triglycerides.  We are requesting her lab work.  She did have a Myoview last year which was overall low risk in the setting of breast attenuation.  Past Medical History:  Diagnosis Date  . Anxiety   . Aortic stenosis    Probable bicuspid aortic valve  . Essential hypertension   . Fibromyalgia   . Hyperlipidemia     Past Surgical History:  Procedure Laterality Date  . ABDOMINAL HYSTERECTOMY    . CESAREAN SECTION    . CHOLECYSTECTOMY      Current Outpatient Medications  Medication Sig Dispense Refill  . Aspirin-Salicylamide-Caffeine (BC HEADACHE POWDER PO) Take by mouth as needed.    . bisoprolol-hydrochlorothiazide (ZIAC) 5-6.25 MG tablet  Take 1 tablet by mouth daily.    Marland Kitchen estrogens-methylTEST (ESTRATEST) 1.25-2.5 MG tablet Take 1 tablet by mouth daily. 30 tablet 5  . fenofibrate (TRICOR) 48 MG tablet Take 48 mg by mouth daily.    Marland Kitchen PREMARIN 1.25 MG tablet TAKE 1 TABLET BY MOUTH ONCE DAILY. 30 tablet 11  . nitroGLYCERIN (NITROSTAT) 0.4 MG SL tablet Place 1 tablet (0.4 mg total) every 5 (five) minutes as needed under the tongue for chest pain. (Patient not taking: Reported on 11/02/2018) 30 tablet 0   No current facility-administered medications for this visit.    Allergies:  Bactrim [sulfamethoxazole-trimethoprim], Ciprofloxacin, Codeine, and Flagyl [metronidazole]   Social History: The patient  reports that she has been smoking cigarettes. She has a 20.00 pack-year smoking history. She has never used smokeless tobacco. She reports that she does not drink alcohol or use drugs.   ROS:  Please see the history of present illness. Otherwise, complete review of systems is positive for none.  All other systems are reviewed and negative.   Physical Exam: VS:  BP 130/81   Pulse (!) 56   Ht 4\' 11"  (1.499 m)   Wt 167 lb 3.2 oz (75.8 kg)   SpO2 99%   BMI 33.77 kg/m , BMI Body mass index is 33.77 kg/m.  Wt Readings from Last 3 Encounters:  11/02/18 167 lb 3.2 oz (75.8 kg)  02/04/18 175 lb (79.4 kg)  11/25/17 178 lb (80.7 kg)    General: Patient  appears comfortable at rest. HEENT: Conjunctiva and lids normal, wearing a mask. Neck: Supple, no elevated JVP or carotid bruits, no thyromegaly. Lungs: Clear to auscultation, nonlabored breathing at rest. Cardiac: Regular rate and rhythm, no S3, 2/6 systolic murmur, no pericardial rub. Abdomen: Soft, nontender, bowel sounds present. Extremities: No pitting edema, distal pulses 2+. Skin: Warm and dry. Musculoskeletal: No kyphosis. Neuropsychiatric: Alert and oriented x3, affect grossly appropriate.  ECG:  An ECG dated 12/24/2016 was personally reviewed today and demonstrated:  Sinus  rhythm with Q wave in lead III.  Recent Labwork:  No interval lab work for review.  Other Studies Reviewed Today:  Lexiscan Myoview 08/29/2017:  There was no ST segment deviation noted during stress.  The study is normal. Large moderate intensity fixed anterior defect with normal wall motion, consistent with breast attenuation though the defect is quite large. Based on body habitus (4'11, 181 lbs, BMI 37) this could explain the size of defect. There is clearly normal anterior wall motion and no reversibility.  This is a low risk study.  The left ventricular ejection fraction is hyperdynamic (>65%).  Echocardiogram 08/26/2018:  1. The left ventricle has normal systolic function with an ejection fraction of 60-65%. The cavity size was normal. Left ventricular diastolic parameters were normal. No evidence of left ventricular regional wall motion abnormalities.  2. The right ventricle has normal systolic function. The cavity was normal. There is no increase in right ventricular wall thickness.  3. The mitral valve is grossly normal. There is mild mitral annular calcification present. Mitral valve regurgitation is moderate by color flow Doppler.  4. The tricuspid valve is grossly normal. Tricuspid valve regurgitation is mild-moderate.  5. The aortic valveis bicuspid. Mild-moderate stenosis of the aortic valve.  6. The aortic root is normal in size and structure.  Assessment and Plan:  1.  Bicuspid aortic valve with stable, mild to moderate range aortic stenosis by most recent echocardiogram.  She also has moderate mitral regurgitation.  Follow-up echocardiogram will be obtained in 1 year.  2.  Intermittent palpitations as described above.  We will obtain a 72-hour ZIO patch for further investigation.  3.  Exertional fatigue and intermittent right-sided chest discomfort.  She did have a relatively low risk Myoview from last year with breast attenuation.  We will bring her back to the office  following her monitor to determine whether any additional cardiac testing is necessary.  May need to consider a cardiac CTA.  4.  Mixed hyperlipidemia.  We are obtaining her lab work from PCP.  She is now on TriCor, presumably with hypertriglyceridemia.  5.  Tobacco abuse.  Smoking cessation has been discussed over time.  Medication Adjustments/Labs and Tests Ordered: Current medicines are reviewed at length with the patient today.  Concerns regarding medicines are outlined above.   Tests Ordered: Orders Placed This Encounter  Procedures  . CARDIAC EVENT MONITOR  . EKG 12-Lead    Medication Changes: No orders of the defined types were placed in this encounter.   Disposition:  Follow up 6 weeks in the [place] office.  Signed, Satira Sark, MD, [facility] 11/02/2018 2:26 PM    [facility] [place], [place], [place] [address] Phone: [phone]; Fax: [phone]

## 2018-11-02 NOTE — Patient Instructions (Signed)
Medication Instructions:  Continue all current medications.  Labwork: none  Testing/Procedures:  Your physician has recommended that you wear a 3 day event monitor. Event monitors are medical devices that record the heart's electrical activity. Doctors most often [place] these monitors to diagnose arrhythmias. Arrhythmias are problems with the speed or rhythm of the heartbeat. The monitor is a small, portable device. You can wear one while you do your normal daily activities. This is usually used to diagnose what is causing palpitations/syncope (passing out).  Office will contact with results via phone or letter.    Follow-Up: 6 weeks   Any Other Special Instructions Will Be Listed Below (If Applicable).  If you need a refill on your cardiac medications before your next appointment, please call your pharmacy.

## 2018-11-03 ENCOUNTER — Telehealth: Payer: Self-pay | Admitting: Cardiology

## 2018-11-03 NOTE — Telephone Encounter (Signed)
Notes recorded by Laurine Blazer, LPN on [id number] at 579FGE PM EDT  Patient notified.  ------   Notes recorded by Laurine Blazer, LPN on [id number] at [id number] PM EDT  Left message to return call.   ------   Notes recorded by Satira Sark, MD on 11/02/2018 at 5:00 PM EDT  Results reviewed. Actually, her LDL was only 62. It was her triglycerides that were so elevated and her current regimen looks appropriate

## 2018-11-03 NOTE — Telephone Encounter (Signed)
Patient is returning a call to [facility]. States she called on 11-02-2018 .

## 2018-11-11 ENCOUNTER — Telehealth: Payer: Self-pay | Admitting: Cardiology

## 2018-11-11 ENCOUNTER — Ambulatory Visit (INDEPENDENT_AMBULATORY_CARE_PROVIDER_SITE_OTHER): Payer: Medicaid Other

## 2018-11-11 ENCOUNTER — Other Ambulatory Visit: Payer: Self-pay | Admitting: *Deleted

## 2018-11-11 DIAGNOSIS — R002 Palpitations: Secondary | ICD-10-CM

## 2018-11-11 NOTE — Telephone Encounter (Signed)
Received call from [facility]. Patient received monitor on 11-07-2018 . She has not placed the monitor on.

## 2018-11-11 NOTE — Telephone Encounter (Signed)
[place] - says green light is flashing - also said she spoke with [employer] and made them aware that she wouldn't start wearing it until last night when she got home

## 2018-11-21 ENCOUNTER — Other Ambulatory Visit: Payer: Self-pay

## 2018-11-21 ENCOUNTER — Encounter (HOSPITAL_COMMUNITY): Payer: Self-pay

## 2018-11-21 ENCOUNTER — Emergency Department (HOSPITAL_COMMUNITY)
Admission: EM | Admit: 2018-11-21 | Discharge: 2018-11-21 | Disposition: A | Discharge status: Home / Self Care | Payer: Medicaid Other | Attending: Emergency Medicine | Admitting: Emergency Medicine

## 2018-11-21 DIAGNOSIS — F17210 Nicotine dependence, cigarettes, uncomplicated: Secondary | ICD-10-CM | POA: Diagnosis not present

## 2018-11-21 DIAGNOSIS — I10 Essential (primary) hypertension: Secondary | ICD-10-CM | POA: Insufficient documentation

## 2018-11-21 DIAGNOSIS — R438 Other disturbances of smell and taste: Secondary | ICD-10-CM | POA: Diagnosis present

## 2018-11-21 DIAGNOSIS — R430 Anosmia: Secondary | ICD-10-CM

## 2018-11-21 DIAGNOSIS — U071 COVID-19: Secondary | ICD-10-CM | POA: Insufficient documentation

## 2018-11-21 LAB — SARS CORONAVIRUS 2 (TAT 6-24 HRS): SARS Coronavirus 2: POSITIVE — AB

## 2018-11-21 NOTE — Discharge Instructions (Signed)
Keep yourself quarantined while your coronavirus test is pending.  Follow-up with your doctor.  Return to the ED if you develop new or worsening symptoms.

## 2018-11-21 NOTE — ED Triage Notes (Addendum)
Pt states she has been using claritin otc for her sinuses.   Pt states after using for 2 days, she has lost her sense of smell.  Pt denies other complaints.  Pt states she really just wants a covid test

## 2018-11-21 NOTE — ED Provider Notes (Signed)
[facility] EMERGENCY DEPARTMENT Provider Note   CSN: [id number] Arrival date & time: 11/21/18  0123     History   Chief Complaint Chief Complaint  Patient presents with  . Loss of smell    HPI Kara Matthews is a 43 y.o. female.     Patient is here with a 2-day history of a loss of sense of smell and taste.  She is concerned that she could have coronavirus.  States she is been using over-the-counter Claritin-D for the past 2 days due to sinus congestion which has improved but now her nose feels dry and she noticed that she was not able to smell the smell of gasoline or a banana that she was eating.  Has no taste or no smell.  She denies any other symptoms.  No fever, chills, body aches, headache, chest pain, shortness of breath no abdominal pain, nausea or vomiting.  No known coronavirus exposures.  No headache. No pain with urination or blood in the urine.  The history is provided by the patient.    Past Medical History:  Diagnosis Date  . Anxiety   . Aortic stenosis    Probable bicuspid aortic valve  . Essential hypertension   . Fibromyalgia   . Hyperlipidemia     Patient Active Problem List   Diagnosis Date Noted  . Lack of sexual desire 07/02/2016    Past Surgical History:  Procedure Laterality Date  . ABDOMINAL HYSTERECTOMY    . CESAREAN SECTION    . CHOLECYSTECTOMY       OB History    Gravida  2   Para  2   Term  2   Preterm      AB      Living  2     SAB      TAB      Ectopic      Multiple      Live Births  2            Home Medications    Prior to Admission medications   Medication Sig Start Date End Date Taking? Authorizing Provider  Aspirin-Salicylamide-Caffeine (BC HEADACHE POWDER PO) Take by mouth as needed.    [provider]  bisoprolol-hydrochlorothiazide (ZIAC) 5-6.25 MG tablet Take 1 tablet by mouth daily.    [provider]  estrogens-methylTEST (ESTRATEST) 1.25-2.5 MG tablet Take 1 tablet by  mouth daily. 07/02/16   Jonnie Kind, MD  fenofibrate (TRICOR) 48 MG tablet Take 48 mg by mouth daily.    [provider]  nitroGLYCERIN (NITROSTAT) 0.4 MG SL tablet Place 1 tablet (0.4 mg total) every 5 (five) minutes as needed under the tongue for chest pain. Patient not taking: Reported on 11/02/2018 12/24/16   Nat Christen, MD  PREMARIN 1.25 MG tablet TAKE 1 TABLET BY MOUTH ONCE DAILY. 04/10/18   Florian Buff, MD    Family History Family History  Problem Relation Age of Onset  . Stroke Father   . Heart failure Brother   . Stroke Mother   . High blood pressure Mother     Social History Social History   Tobacco Use  . Smoking status: Current Every Day Smoker    Packs/day: 1.00    Years: 20.00    Pack years: 20.00    Types: Cigarettes  . Smokeless tobacco: Never Used  . Tobacco comment: maybe less than pack per day   Substance Use Topics  . Alcohol use: No  Alcohol/week: 0.0 standard drinks  . Drug use: No     Allergies   Bactrim [sulfamethoxazole-trimethoprim], Ciprofloxacin, Codeine, and Flagyl [metronidazole]   Review of Systems Review of Systems  Constitutional: Negative for activity change, appetite change, fatigue and fever.  HENT: Positive for sinus pressure. Negative for dental problem, ear pain, facial swelling, mouth sores, trouble swallowing and voice change.   Eyes: Negative for visual disturbance.  Respiratory: Negative for cough, chest tightness and shortness of breath.   Cardiovascular: Negative for chest pain.  Gastrointestinal: Negative for abdominal pain, nausea and vomiting.  Genitourinary: Negative for dysuria and hematuria.  Musculoskeletal: Negative for arthralgias and myalgias.  Skin: Negative for rash.  Neurological: Negative for dizziness, weakness and headaches.    all other systems are negative except as noted in the HPI and PMH.    Physical Exam Updated Vital Signs BP 125/88 (BP Location: Right Arm)   Pulse (!) 59    Temp 97.8 F (36.6 C) (Oral)   Resp 16   Ht 4\' 11"  (1.499 m)   Wt 72.1 kg   SpO2 98%   BMI 32.11 kg/m   Physical Exam Vitals signs and nursing note reviewed.  Constitutional:      General: She is not in acute distress.    Appearance: Normal appearance. She is well-developed and normal weight. She is not ill-appearing.  HENT:     Head: Normocephalic and atraumatic.     Nose: Nose normal. No rhinorrhea.     Comments: No frontal or maxillary sinus tenderness    Mouth/Throat:     Mouth: Mucous membranes are moist.     Pharynx: No oropharyngeal exudate.  Eyes:     Conjunctiva/sclera: Conjunctivae normal.     Pupils: Pupils are equal, round, and reactive to light.  Neck:     Musculoskeletal: Normal range of motion and neck supple.     Comments: No meningismus. Cardiovascular:     Rate and Rhythm: Normal rate and regular rhythm.     Heart sounds: Normal heart sounds. No murmur.  Pulmonary:     Effort: Pulmonary effort is normal. No respiratory distress.     Breath sounds: Normal breath sounds.  Abdominal:     Palpations: Abdomen is soft.     Tenderness: There is no abdominal tenderness. There is no guarding or rebound.  Musculoskeletal: Normal range of motion.        General: No tenderness.  Skin:    General: Skin is warm.  Neurological:     Mental Status: She is alert and oriented to person, place, and time.     Cranial Nerves: No cranial nerve deficit.     Motor: No abnormal muscle tone.     Coordination: Coordination normal.     Comments: No ataxia on finger to nose bilaterally. No pronator drift. 5/5 strength throughout. CN 2-12 intact.Equal grip strength. Sensation intact.   Psychiatric:        Behavior: Behavior normal.      ED Treatments / Results  Labs (all labs ordered are listed, but only abnormal results are displayed) Labs Reviewed  SARS CORONAVIRUS 2 (TAT 6-24 HRS)    EKG None  Radiology No results found.  Procedures Procedures (including  critical care time)  Medications Ordered in ED Medications - No data to display   Initial Impression / Assessment and Plan / ED Course  I have reviewed the triage vital signs and the nursing notes.  Pertinent labs & imaging results that were available during  my care of the patient were reviewed by me and considered in my medical decision making (see chart for details).       2 days of loss of taste and smell.  No other symptoms but patient concern for coronavirus.  She is afebrile.  No chest pain or shortness of breath.  We will check coronavirus swab.  Patient aware that results would not be immediately available and is advised to follow quarantine precautions until until results are known. Advised that she can check her results online.  Follow up with PCP.  Return precautions discussed.   Kara Matthews was evaluated in Emergency Department on 11/21/2018 for the symptoms described in the history of present illness. She was evaluated in the context of the global COVID-19 pandemic, which necessitated consideration that the patient might be at risk for infection with the SARS-CoV-2 virus that causes COVID-19. Institutional protocols and algorithms that pertain to the evaluation of patients at risk for COVID-19 are in a state of rapid change based on information released by regulatory bodies including the CDC and federal and state organizations. These policies and algorithms were followed during the patient's care in the ED.   Final Clinical Impressions(s) / ED Diagnoses   Final diagnoses:  Loss of smell    ED Discharge Orders    None       Becket Wecker, Annie Main, MD 11/21/18 [phone]

## 2018-11-25 ENCOUNTER — Telehealth: Payer: Self-pay | Admitting: *Deleted

## 2018-11-25 NOTE — Telephone Encounter (Signed)
Patient informed and verbalized understanding. Copy sent to PCP 

## 2018-11-25 NOTE — Telephone Encounter (Signed)
-----   Message from Satira Sark, MD sent at 11/24/2018 10:28 AM EDT ----- Results reviewed.  Overall heart rhythm is normal.  She did have some brief episodes of SVT that she is likely feeling as palpitations.  None of these were sustained, these are benign arrhythmias.  We will have to see how she is doing in case we need to adjust her medications, but overall her heart rate is well controlled at baseline.

## 2018-12-15 ENCOUNTER — Other Ambulatory Visit: Payer: Self-pay

## 2018-12-15 ENCOUNTER — Encounter: Payer: Self-pay | Admitting: Cardiology

## 2018-12-15 ENCOUNTER — Ambulatory Visit (INDEPENDENT_AMBULATORY_CARE_PROVIDER_SITE_OTHER): Payer: Medicaid Other | Admitting: Cardiology

## 2018-12-15 VITALS — BP 105/67 | HR 57 | Ht 59.0 in | Wt 164.6 lb

## 2018-12-15 DIAGNOSIS — I340 Nonrheumatic mitral (valve) insufficiency: Secondary | ICD-10-CM

## 2018-12-15 DIAGNOSIS — I350 Nonrheumatic aortic (valve) stenosis: Secondary | ICD-10-CM | POA: Diagnosis not present

## 2018-12-15 DIAGNOSIS — Z720 Tobacco use: Secondary | ICD-10-CM

## 2018-12-15 DIAGNOSIS — Z23 Encounter for immunization: Secondary | ICD-10-CM

## 2018-12-15 DIAGNOSIS — E782 Mixed hyperlipidemia: Secondary | ICD-10-CM

## 2018-12-15 NOTE — Patient Instructions (Addendum)

## 2018-12-15 NOTE — Progress Notes (Signed)
Cardiology Office Note  Date: 12/15/2018   ID: Kara Matthews, DOB September 25, 1975, MRN [id number]  PCP:  Raiford Simmonds., PA-C  Cardiologist:  Rozann Lesches, MD Electrophysiologist:  None   Chief Complaint  Patient presents with   Cardiac follow-up    History of Present Illness: Kara Matthews is a 43 y.o. female last seen in September.  She presents for a follow-up visit.  She does not report any progressive sense of shortness of breath or fatigue, no palpitations recently.  No chest pain or syncope.  Interval cardiac monitor showed sinus rhythm with brief episodes of SVT.  She is already on Ziac for control of high blood pressure and her resting heart rate is in the 50s.  I discussed the results with her.  Echocardiogram done back in July showed valvular heart disease including a bicuspid aortic valve with mild to moderate stenosis and also moderate mitral regurgitation.  She was diagnosed with COVID-19 back on October 3.  She quarantined at home, symptoms included loss of taste and smell, intermittent headaches.  She is back to baseline at this point.  Past Medical History:  Diagnosis Date   Anxiety    Aortic stenosis    Probable bicuspid aortic valve   Essential hypertension    Fibromyalgia    Hyperlipidemia     Past Surgical History:  Procedure Laterality Date   ABDOMINAL HYSTERECTOMY     CESAREAN SECTION     CHOLECYSTECTOMY      Current Outpatient Medications  Medication Sig Dispense Refill   Aspirin-Salicylamide-Caffeine (BC HEADACHE POWDER PO) Take by mouth as needed.     bisoprolol-hydrochlorothiazide (ZIAC) 5-6.25 MG tablet Take 1 tablet by mouth daily.     fenofibrate (TRICOR) 48 MG tablet Take 48 mg by mouth daily.     PREMARIN 1.25 MG tablet TAKE 1 TABLET BY MOUTH ONCE DAILY. 30 tablet 11   No current facility-administered medications for this visit.    Allergies:  Bactrim [sulfamethoxazole-trimethoprim], Ciprofloxacin, Codeine, and  Flagyl [metronidazole]   Social History: The patient  reports that she has been smoking cigarettes. She has a 20.00 pack-year smoking history. She has never used smokeless tobacco. She reports that she does not drink alcohol or use drugs.   ROS:  Please see the history of present illness. Otherwise, complete review of systems is positive for none.  All other systems are reviewed and negative.   Physical Exam: VS:  BP 105/67    Pulse (!) 57    Ht 4\' 11"  (1.499 m)    Wt 164 lb 9.6 oz (74.7 kg)    SpO2 97%    BMI 33.25 kg/m , BMI Body mass index is 33.25 kg/m.  Wt Readings from Last 3 Encounters:  12/15/18 164 lb 9.6 oz (74.7 kg)  11/21/18 159 lb (72.1 kg)  11/02/18 167 lb 3.2 oz (75.8 kg)    General: Patient appears comfortable at rest. HEENT: Conjunctiva and lids normal, wearing a mask. Neck: Supple, no elevated JVP or carotid bruits, no thyromegaly. Lungs: Clear to auscultation, nonlabored breathing at rest. Cardiac: Regular rate and rhythm, no S3, 2/6 systolic murmur. Abdomen: Soft, nontender, bowel sounds present. Extremities: No pitting edema, distal pulses 2+. Skin: Warm and dry. Musculoskeletal: No kyphosis. Neuropsychiatric: Alert and oriented x3, affect grossly appropriate.  ECG:  An ECG dated 11/02/2018 was personally reviewed today and demonstrated:  Sinus bradycardia with nonspecific T wave changes in lead loss in V4.  Recent Labwork:  September 2020: Cholesterol  120, HDL 29, triglycerides 235, LDL 62  Other Studies Reviewed Today:  Cardiac monitor 11/11/2018: 72-hour ZIO patch reviewed.  Predominant rhythm is sinus with heart rate ranging from 43 bpm up to 93 bpm and average heart rate 61 bpm. Brief episodes of SVT were noted, the longest lasting approximately 12 seconds and with heart rate ranging from 107-135 during these brief events.  There were only rare PACs and PVCs otherwise.  No sustained arrhythmias or pauses.  Echocardiogram 08/26/2018:  1. The left ventricle  has normal systolic function with an ejection fraction of 60-65%. The cavity size was normal. Left ventricular diastolic parameters were normal. No evidence of left ventricular regional wall motion abnormalities.  2. The right ventricle has normal systolic function. The cavity was normal. There is no increase in right ventricular wall thickness.  3. The mitral valve is grossly normal. There is mild mitral annular calcification present. Mitral valve regurgitation is moderate by color flow Doppler.  4. The tricuspid valve is grossly normal. Tricuspid valve regurgitation is mild-moderate.  5. The aortic valveis bicuspid. Mild-moderate stenosis of the aortic valve.  6. The aortic root is normal in size and structure.  Assessment and Plan:  1.  Bicuspid aortic valve with mild to moderate aortic stenosis.  Also moderate mitral regurgitation.  We will plan a follow-up echocardiogram prior to her next visit in 6 months.  2.  Intermittent palpitations.  She had brief episodes of SVT by recent cardiac monitor, otherwise is in sinus rhythm with heart rate in the high 50s on Ziac.  We will continue with observation.  3.  Mixed hyperlipidemia.  Lab work done in September showed LDL 62 and triglycerides 235.  She is currently on TriCor per PCP.  4.  Tobacco abuse.  Smoking cessation has been discussed.  Medication Adjustments/Labs and Tests Ordered: Current medicines are reviewed at length with the patient today.  Concerns regarding medicines are outlined above.   Tests Ordered: Orders Placed This Encounter  Procedures   ECHOCARDIOGRAM COMPLETE    Medication Changes: No orders of the defined types were placed in this encounter.   Disposition:  Follow up 6 months in the [place] office.  Signed, Satira Sark, MD, [facility] 12/15/2018 4:42 PM    [facility] [place], [place], [place] [address] Phone: [phone]; Fax: [phone]

## 2019-02-26 ENCOUNTER — Telehealth: Payer: Self-pay | Admitting: *Deleted

## 2019-02-26 ENCOUNTER — Telehealth: Payer: Self-pay | Admitting: Obstetrics & Gynecology

## 2019-02-26 MED ORDER — CEPHALEXIN 500 MG PO CAPS: 500.0000 mg | ORAL_CAPSULE | Freq: Three times a day (TID) | ORAL | 0 refills | Status: DC

## 2019-02-26 NOTE — Telephone Encounter (Signed)
Patient left message that she thinks she has a uti and wants to be seen today.

## 2019-02-26 NOTE — Telephone Encounter (Signed)
Pt has the urge to urinate but she's not urinating much. Pain and burning at the end of urinating. Can you order something? Thanks!! Dawson

## 2019-05-20 ENCOUNTER — Other Ambulatory Visit: Payer: Self-pay | Admitting: Obstetrics & Gynecology

## 2019-05-29 ENCOUNTER — Ambulatory Visit
Admission: EM | Admit: 2019-05-29 | Discharge: 2019-05-29 | Disposition: A | Discharge status: Home / Self Care | Payer: Medicaid Other | Attending: Emergency Medicine | Admitting: Emergency Medicine

## 2019-05-29 ENCOUNTER — Other Ambulatory Visit: Payer: Self-pay

## 2019-05-29 DIAGNOSIS — R52 Pain, unspecified: Secondary | ICD-10-CM

## 2019-05-29 MED ORDER — GABAPENTIN 100 MG PO CAPS: 100.0000 mg | ORAL_CAPSULE | Freq: Three times a day (TID) | ORAL | 0 refills | Status: DC

## 2019-05-29 MED ORDER — METHYLPREDNISOLONE SODIUM SUCC 125 MG IJ SOLR
125.0000 mg | Freq: Once | INTRAMUSCULAR | Status: AC
  Administered 2019-05-29: 125 mg via INTRAMUSCULAR

## 2019-05-29 NOTE — ED Triage Notes (Signed)
Pt presents with c/o fibromyalgia pain that began yesterday , pt hurts all over

## 2019-05-29 NOTE — ED Provider Notes (Signed)
[facility]    CSN: [id number] Arrival date & time: 05/29/19  1451      History   Chief Complaint No chief complaint on file.   HPI Kara Matthews is a 44 y.o. female.   With history of fibromyalgia presented to the urgent care for complaint of generalized body aches for the past few days.  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  She localized the pain to her generalized body.  Described as constant and achy.  Has not tried any OTC medication.  Symptoms are made worse with movement.  Reports similar symptoms in the past and was prescribed Lyrica for fibromyalgia.  States she never took Lyrica.  Denies chills, fever, nausea, diarrhea, vomiting, chest pain, chest tightness  The history is provided by the patient. No language interpreter was used.    Past Medical History:  Diagnosis Date  . Anxiety   . Aortic stenosis    Probable bicuspid aortic valve  . Essential hypertension   . Fibromyalgia   . Hyperlipidemia     Patient Active Problem List   Diagnosis Date Noted  . Lack of sexual desire 07/02/2016    Past Surgical History:  Procedure Laterality Date  . ABDOMINAL HYSTERECTOMY    . CESAREAN SECTION    . CHOLECYSTECTOMY      OB History    Gravida  2   Para  2   Term  2   Preterm      AB      Living  2     SAB      TAB      Ectopic      Multiple      Live Births  2            Home Medications    Prior to Admission medications   Medication Sig Start Date End Date Taking? Authorizing Provider  Aspirin-Salicylamide-Caffeine (BC HEADACHE POWDER PO) Take by mouth as needed.    [provider]  bisoprolol-hydrochlorothiazide (ZIAC) 5-6.25 MG tablet Take 1 tablet by mouth daily.    [provider]  cephALEXin (KEFLEX) 500 MG capsule Take 1 capsule (500 mg total) by mouth 3 (three) times daily. 02/26/19   Florian Buff, MD  fenofibrate (TRICOR) 48 MG tablet Take 48 mg by mouth daily.     [provider]  gabapentin (NEURONTIN) 100 MG capsule Take 1 capsule (100 mg total) by mouth 3 (three) times daily. 05/29/19   Naviah Belfield, Darrelyn Hillock, FNP  PREMARIN 1.25 MG tablet TAKE 1 TABLET ONCE DAILY. 05/20/19   Florian Buff, MD    Family History Family History  Problem Relation Age of Onset  . Stroke Father   . Heart failure Brother   . Stroke Mother   . High blood pressure Mother     Social History Social History   Tobacco Use  . Smoking status: Current Every Day Smoker    Packs/day: 1.00    Years: 20.00    Pack years: 20.00    Types: Cigarettes  . Smokeless tobacco: Never Used  . Tobacco comment: maybe less than pack per day   Substance Use Topics  . Alcohol use: No    Alcohol/week: 0.0 standard drinks  . Drug use: No     Allergies   Bactrim [sulfamethoxazole-trimethoprim], Ciprofloxacin, Codeine, and Flagyl [metronidazole]   Review of Systems Review of Systems  Constitutional: Negative.   Respiratory: Negative.   Cardiovascular:  Negative.   Musculoskeletal: Positive for arthralgias and myalgias.  All other systems reviewed and are negative.    Physical Exam Triage Vital Signs ED Triage Vitals  Enc Vitals Group     BP 05/29/19 1504 130/82     Pulse Rate 05/29/19 1504 70     Resp 05/29/19 1504 18     Temp 05/29/19 1504 98.4 F (36.9 C)     Temp src --      SpO2 05/29/19 1504 95 %     Weight --      Height --      Head Circumference --      Peak Flow --      Pain Score 05/29/19 1501 10     Pain Loc --      Pain Edu? --      Excl. in [place]? --    No data found.  Updated Vital Signs BP 130/82   Pulse 70   Temp 98.4 F (36.9 C)   Resp 18   SpO2 95%   Visual Acuity Right Eye Distance:   Left Eye Distance:   Bilateral Distance:    Right Eye Near:   Left Eye Near:    Bilateral Near:     Physical Exam Vitals and nursing note reviewed.  Constitutional:      General: She is not in acute distress.    Appearance: Normal appearance.  She is normal weight. She is not ill-appearing, toxic-appearing or diaphoretic.  Cardiovascular:     Rate and Rhythm: Normal rate and regular rhythm.     Pulses: Normal pulses.     Heart sounds: Normal heart sounds. No murmur. No friction rub. No gallop.   Pulmonary:     Effort: Pulmonary effort is normal. No respiratory distress.     Breath sounds: Normal breath sounds. No stridor. No wheezing, rhonchi or rales.  Chest:     Chest wall: No tenderness.  Musculoskeletal:        General: Tenderness present. No swelling, deformity or signs of injury.     Right lower leg: No edema.     Left lower leg: No edema.  Neurological:     Mental Status: She is alert and oriented to person, place, and time.     Cranial Nerves: No cranial nerve deficit.     Motor: No weakness.     Coordination: Coordination normal.      UC Treatments / Results  Labs (all labs ordered are listed, but only abnormal results are displayed) Labs Reviewed - No data to display  EKG   Radiology No results found.  Procedures Procedures (including critical care time)  Medications Ordered in UC Medications  methylPREDNISolone sodium succinate (SOLU-MEDROL) 125 mg/2 mL injection 125 mg (has no administration in time range)    Initial Impression / Assessment and Plan / UC Course  I have reviewed the triage vital signs and the nursing notes.  Pertinent labs & imaging results that were available during my care of the patient were reviewed by me and considered in my medical decision making (see chart for details).    Patient is  stable for discharge.  Gabapentin will be prescribed for severe fibromyalgia symptoms as she refused Lyrica.  Solu-Medrol IM was given in office.  Was advised to follow-up with PCP.  Work note was given.  To return or go to ED for worsening of symptoms  Final Clinical Impressions(s) / UC Diagnoses   Final diagnoses:  Generalized body aches  Discharge Instructions     Solu-Medrol  IM was given in office Gabapentin was prescribed Follow-up with PCP Work note was provided Return or go to ED for worsening symptoms.    ED Prescriptions    Medication Sig Dispense Auth. Provider   gabapentin (NEURONTIN) 100 MG capsule Take 1 capsule (100 mg total) by mouth 3 (three) times daily. 90 capsule Mackenize Delgadillo, Darrelyn Hillock, FNP     PDMP not reviewed this encounter.   Emerson Monte, [place] 05/29/19 1533

## 2019-05-29 NOTE — Discharge Instructions (Signed)
Solu-Medrol IM was given in office Gabapentin was prescribed Follow-up with PCP Work note was provided Return or go to ED for worsening symptoms.

## 2019-05-30 ENCOUNTER — Other Ambulatory Visit: Payer: Self-pay

## 2019-05-30 DIAGNOSIS — Z5321 Procedure and treatment not carried out due to patient leaving prior to being seen by health care provider: Secondary | ICD-10-CM | POA: Insufficient documentation

## 2019-05-30 DIAGNOSIS — R040 Epistaxis: Secondary | ICD-10-CM | POA: Diagnosis present

## 2019-05-30 NOTE — ED Triage Notes (Signed)
Pt presents with reports of flushed face and red eyes after taking gabapentin. Pt denies difficulty breathing, throat or tongue swelling.

## 2019-05-31 ENCOUNTER — Emergency Department (HOSPITAL_COMMUNITY)
Admission: EM | Admit: 2019-05-31 | Discharge: 2019-05-31 | Disposition: A | Discharge status: Home / Self Care | Payer: Medicaid Other | Attending: Emergency Medicine | Admitting: Emergency Medicine

## 2019-05-31 ENCOUNTER — Other Ambulatory Visit: Payer: Self-pay

## 2019-05-31 ENCOUNTER — Encounter (HOSPITAL_COMMUNITY): Payer: Self-pay | Admitting: Emergency Medicine

## 2019-05-31 DIAGNOSIS — T887XXA Unspecified adverse effect of drug or medicament, initial encounter: Secondary | ICD-10-CM

## 2019-05-31 NOTE — ED Provider Notes (Signed)
Patient not in room on attempted evaluation at 1:35 AM.   Kara Essex, MD 05/31/19 0134

## 2019-06-08 ENCOUNTER — Other Ambulatory Visit: Payer: Self-pay

## 2019-06-08 ENCOUNTER — Ambulatory Visit: Payer: Medicaid Other | Attending: Internal Medicine

## 2019-06-08 DIAGNOSIS — Z20822 Contact with and (suspected) exposure to covid-19: Secondary | ICD-10-CM

## 2019-06-09 ENCOUNTER — Ambulatory Visit (INDEPENDENT_AMBULATORY_CARE_PROVIDER_SITE_OTHER): Payer: Medicaid Other

## 2019-06-09 ENCOUNTER — Other Ambulatory Visit: Payer: Self-pay

## 2019-06-09 DIAGNOSIS — I350 Nonrheumatic aortic (valve) stenosis: Secondary | ICD-10-CM | POA: Diagnosis not present

## 2019-06-09 LAB — SARS-COV-2, NAA 2 DAY TAT

## 2019-06-09 LAB — NOVEL CORONAVIRUS, NAA: SARS-CoV-2, NAA: NOT DETECTED

## 2019-06-11 ENCOUNTER — Telehealth: Payer: Self-pay | Admitting: *Deleted

## 2019-06-11 NOTE — Telephone Encounter (Signed)
-----   Message from Satira Sark, MD sent at 06/09/2019  4:04 PM EDT ----- Results reviewed.  Follow-up study shows vigorous LVEF at 70 to 75%.  Probable bicuspid aortic valve as before with stable, mild to moderate stenosis, gradient actually measures less by this study.  Continue with current follow-up plan.

## 2019-06-11 NOTE — Telephone Encounter (Signed)
Pt voiced understanding

## 2019-07-20 ENCOUNTER — Ambulatory Visit (INDEPENDENT_AMBULATORY_CARE_PROVIDER_SITE_OTHER): Payer: Medicaid Other | Admitting: Cardiology

## 2019-07-20 ENCOUNTER — Encounter: Payer: Self-pay | Admitting: Cardiology

## 2019-07-20 ENCOUNTER — Other Ambulatory Visit: Payer: Self-pay

## 2019-07-20 VITALS — BP 122/70 | HR 62 | Ht 59.0 in | Wt 170.0 lb

## 2019-07-20 DIAGNOSIS — I350 Nonrheumatic aortic (valve) stenosis: Secondary | ICD-10-CM | POA: Diagnosis not present

## 2019-07-20 DIAGNOSIS — R002 Palpitations: Secondary | ICD-10-CM

## 2019-07-20 DIAGNOSIS — E782 Mixed hyperlipidemia: Secondary | ICD-10-CM

## 2019-07-20 NOTE — Patient Instructions (Addendum)
Medication Instructions:   Your physician recommends that you continue on your current medications as directed. Please refer to the Current Medication list given to you today.  Labwork:  NONE  Testing/Procedures: Your physician has requested that you have an echocardiogram in April 2022. Echocardiography is a painless test that uses sound waves to create images of your heart. It provides your doctor with information about the size and shape of your heart and how well your heart's chambers and valves are working. This procedure takes approximately one hour. There are no restrictions for this procedure.  Follow-Up:  Your physician recommends that you schedule a follow-up appointment in: April 2022. You will receive a reminder letter in the mail in about 2 months in advance reminding you to call and schedule your appointment. If you don't receive this letter, please contact our office.  Any Other Special Instructions Will Be Listed Below (If Applicable).  If you need a refill on your cardiac medications before your next appointment, please call your pharmacy.

## 2019-07-20 NOTE — Progress Notes (Signed)
Cardiology Office Note  Date: 07/20/2019   ID: Kara Matthews, DOB May 31, 1975, MRN [id number]  PCP:  Raiford Simmonds., PA-C  Cardiologist:  Rozann Lesches, MD Electrophysiologist:  None   Chief Complaint  Patient presents with   Cardiac follow-up    History of Present Illness: Kara Matthews is a 44 y.o. female last seen in October 2020.  She presents for a follow-up visit.  She continues to work for [employer].  She has not noticed any new exertional symptoms or change in stamina.  She had a recent follow-up echocardiogram in April that indicated LVEF 70 to 75% with probable bicuspid aortic valve as before and mild to moderate aortic stenosis, valve gradients were actually somewhat less than the prior study.  We plan to continue with observation.  I reviewed her medications which are outlined below.  Past Medical History:  Diagnosis Date   Anxiety    Aortic stenosis    Probable bicuspid aortic valve   Essential hypertension    Fibromyalgia    Hyperlipidemia     Past Surgical History:  Procedure Laterality Date   ABDOMINAL HYSTERECTOMY     CESAREAN SECTION     CHOLECYSTECTOMY      Current Outpatient Medications  Medication Sig Dispense Refill   bisoprolol-hydrochlorothiazide (ZIAC) 5-6.25 MG tablet Take 1 tablet by mouth daily.     fenofibrate (TRICOR) 48 MG tablet Take 48 mg by mouth daily. Takes occasionally when she remembers.     gabapentin (NEURONTIN) 100 MG capsule Take 100 mg by mouth. Maybe takes twice a week.     PREMARIN 1.25 MG tablet TAKE 1 TABLET ONCE DAILY. 30 tablet 11   No current facility-administered medications for this visit.   Allergies:  Bactrim [sulfamethoxazole-trimethoprim], Ciprofloxacin, Codeine, and Flagyl [metronidazole]   ROS:   No palpitations or syncope.  Physical Exam: VS:  BP 122/70    Pulse 62    Ht 4\' 11"  (1.499 m)    Wt 170 lb (77.1 kg)    SpO2 98%    BMI 34.34 kg/m , BMI Body mass index is 34.34 kg/m.  Wt  Readings from Last 3 Encounters:  07/20/19 170 lb (77.1 kg)  05/31/19 160 lb (72.6 kg)  12/15/18 164 lb 9.6 oz (74.7 kg)    General: Patient appears comfortable at rest. HEENT: Conjunctiva and lids normal, wearing a mask. Neck: Supple, no elevated JVP or carotid bruits, no thyromegaly. Lungs: Clear to auscultation, nonlabored breathing at rest. Cardiac: Regular rate and rhythm, no S3, 3/6 systolic murmur, no pericardial rub. Extremities: No pitting edema, distal pulses 2+.  ECG:  An ECG dated 11/02/2018 was personally reviewed today and demonstrated:  Sinus bradycardia with nonspecific T wave changes and lead loss and V4.  Recent Labwork:  September 2020: Cholesterol 120, HDL 29, triglycerides 235, LDL 62  Other Studies Reviewed Today:  Cardiac monitor 11/11/2018: 72-hour ZIO patch reviewed. Predominant rhythm is sinus with heart rate ranging from 43 bpm up to 93 bpm and average heart rate 61 bpm. Brief episodes of SVT were noted, the longest lasting approximately 12 seconds and with heart rate ranging from 107-135 during these brief events. There were only rare PACs and PVCs otherwise. No sustained arrhythmias or pauses.  Echocardiogram 06/09/2019: 1. Left ventricular ejection fraction, by estimation, is 70 to 75%. The  left ventricle has hyperdynamic function. The left ventricle has no  regional wall motion abnormalities. There is mild left ventricular  hypertrophy. Left ventricular diastolic  parameters  were normal.  2. Right ventricular systolic function is normal. The right ventricular  size is normal. There is normal pulmonary artery systolic pressure. The  estimated right ventricular systolic pressure is 123XX123 mmHg.  3. Left atrial size was mildly dilated.  4. The mitral valve is grossly normal. Mild mitral valve regurgitation.  5. The aortic valve has an indeterminant number of cusps, possibly  bicuspid. Aortic valve regurgitation is not visualized. Mild to moderate    aortic valve stenosis. Aortic valve area, by VTI measures 1.41 cm. Aortic  valve mean gradient measures 16.2  mmHg.  6. The inferior vena cava is normal in size with greater than 50%  respiratory variability, suggesting right atrial pressure of 3 mmHg.   Assessment and Plan:  1.  Bicuspid aortic valve with stable, mild to moderate aortic stenosis by follow-up echocardiogram in April.  We will plan a follow-up echocardiogram with office visit for next April.  2.  History of palpitations, not progressive.  Prior cardiac monitor showed brief episodes of SVT, no changes anticipated current medical therapy which includes Ziac.  Medication Adjustments/Labs and Tests Ordered: Current medicines are reviewed at length with the patient today.  Concerns regarding medicines are outlined above.   Tests Ordered: Orders Placed This Encounter  Procedures   ECHOCARDIOGRAM COMPLETE    Medication Changes: No orders of the defined types were placed in this encounter.   Disposition:  Follow up Echocardiogram and office visit next April.  Signed, Satira Sark, MD, [facility] 07/20/2019 2:36 PM    [name], [name], [name] Phone: [phone]; Fax: [phone]

## 2019-12-28 ENCOUNTER — Other Ambulatory Visit: Payer: Self-pay

## 2019-12-28 ENCOUNTER — Ambulatory Visit
Admission: EM | Admit: 2019-12-28 | Discharge: 2019-12-28 | Disposition: A | Discharge status: Home / Self Care | Payer: Medicaid Other | Attending: Emergency Medicine | Admitting: Emergency Medicine

## 2019-12-28 ENCOUNTER — Ambulatory Visit (INDEPENDENT_AMBULATORY_CARE_PROVIDER_SITE_OTHER): Payer: Medicaid Other

## 2019-12-28 DIAGNOSIS — M25521 Pain in right elbow: Secondary | ICD-10-CM | POA: Diagnosis not present

## 2019-12-28 DIAGNOSIS — M778 Other enthesopathies, not elsewhere classified: Secondary | ICD-10-CM

## 2019-12-28 MED ORDER — CYCLOBENZAPRINE HCL 10 MG PO TABS
10.0000 mg | ORAL_TABLET | Freq: Every day | ORAL | 0 refills | Status: DC
Start: 2019-12-28 — End: 2020-04-27

## 2019-12-28 MED ORDER — PREDNISONE 20 MG PO TABS: 20.0000 mg | ORAL_TABLET | Freq: Two times a day (BID) | ORAL | 0 refills | Status: AC

## 2019-12-28 NOTE — ED Provider Notes (Signed)
[place]   [id number] 12/28/19 Arrival Time: 9629  CC: RT elbow PAIN  SUBJECTIVE: History from: patient. Kara Matthews is a 44 y.o. female complains of RT elbow pain x 1 months, worse over the past 1-2 days.  Denies a precipitating event or specific injury. Does admit to a lot of repetitive activities with the RUE.  Localizes pain to inside of elbow.  Describes the pain as intermittent and pressure in character.  Has tried OTC tylenol without relief.  Symptoms are made worse with movement.  Denies similar symptoms in the past.  Complains of associated swelling.  Denies fever, chills, erythema, ecchymosis, weakness, numbness and tingling.  ROS: As per HPI.  All other pertinent ROS negative.     Past Medical History:  Diagnosis Date  . Anxiety   . Aortic stenosis    Probable bicuspid aortic valve  . Essential hypertension   . Fibromyalgia   . Hyperlipidemia    Past Surgical History:  Procedure Laterality Date  . ABDOMINAL HYSTERECTOMY    . CESAREAN SECTION    . CHOLECYSTECTOMY     Allergies  Allergen Reactions  . Bactrim [Sulfamethoxazole-Trimethoprim]   . Ciprofloxacin Hives  . Codeine Other (See Comments)    Stomach upset.  . Flagyl [Metronidazole] Other (See Comments)    Taste of "metal" in her mouth    No current facility-administered medications on file prior to encounter.   Current Outpatient Medications on File Prior to Encounter  Medication Sig Dispense Refill  . bisoprolol-hydrochlorothiazide (ZIAC) 5-6.25 MG tablet Take 1 tablet by mouth daily.    . fenofibrate (TRICOR) 48 MG tablet Take 48 mg by mouth daily. Takes occasionally when she remembers.    . gabapentin (NEURONTIN) 100 MG capsule Take 100 mg by mouth. Maybe takes twice a week.    Marland Kitchen PREMARIN 1.25 MG tablet TAKE 1 TABLET ONCE DAILY. 30 tablet 11   Social History   Socioeconomic History  . Marital status: Married    Spouse name: Not on file  . Number of children: Not on file  .  Years of education: Not on file  . Highest education level: Not on file  Occupational History  . Not on file  Tobacco Use  . Smoking status: Current Every Day Smoker    Packs/day: 1.00    Years: 20.00    Pack years: 20.00    Types: Cigarettes  . Smokeless tobacco: Never Used  . Tobacco comment: maybe less than pack per day   Vaping Use  . Vaping Use: Never used  Substance and Sexual Activity  . Alcohol use: No    Alcohol/week: 0.0 standard drinks  . Drug use: No  . Sexual activity: Yes    Birth control/protection: Surgical    Comment: hyst  Other Topics Concern  . Not on file  Social History Narrative  . Not on file   Social Determinants of Health   Financial Resource Strain:   . Difficulty of Paying Living Expenses: Not on file  Food Insecurity:   . Worried About Charity fundraiser in the Last Year: Not on file  . Ran Out of Food in the Last Year: Not on file  Transportation Needs:   . Lack of Transportation (Medical): Not on file  . Lack of Transportation (Non-Medical): Not on file  Physical Activity:   . Days of Exercise per Week: Not on file  . Minutes of Exercise per Session: Not on file  Stress:   . Feeling  of Stress : Not on file  Social Connections:   . Frequency of Communication with Friends and Family: Not on file  . Frequency of Social Gatherings with Friends and Family: Not on file  . Attends Religious Services: Not on file  . Active Member of Clubs or Organizations: Not on file  . Attends Archivist Meetings: Not on file  . Marital Status: Not on file  Intimate Partner Violence:   . Fear of Current or Ex-Partner: Not on file  . Emotionally Abused: Not on file  . Physically Abused: Not on file  . Sexually Abused: Not on file   Family History  Problem Relation Age of Onset  . Stroke Father   . Heart failure Brother   . Stroke Mother   . High blood pressure Mother     OBJECTIVE:  Vitals:   12/28/19 1203  BP: 119/84  Pulse: 69    Resp: 17  Temp: 97.8 F (36.6 C)  TempSrc: Oral  SpO2: 97%    General appearance: ALERT; in no acute distress.  Head: NCAT Lungs: Normal respiratory effort CV: Radial pulse 2+ Musculoskeletal: RT arm Inspection: mild swelling to distal extremity Palpation: Diffusely TTP over medial elbow, posterior proximal forearm, and lateral shoulder ROM: FROM passive about the elbow Strength: 5/5 shld abduction, 5/5 shld adduction, 4+/5 elbow flexion, 4+/5 elbow extension, 5/5 grip strength Skin: warm and dry Neurologic: Ambulates without difficulty Psychological: alert and cooperative; normal mood and affect  DIAGNOSTIC STUDIES:  DG Elbow Complete Right  Result Date: 12/28/2019 CLINICAL DATA:  Right elbow pain for 1 month without known injury. EXAM: RIGHT ELBOW - COMPLETE 3+ VIEW COMPARISON:  None. FINDINGS: There is no evidence of fracture, dislocation, or joint effusion. There is no evidence of arthropathy or other focal bone abnormality. Soft tissues are unremarkable. IMPRESSION: Negative. Electronically Signed   By: Marijo Conception M.D.   On: 12/28/2019 12:54    X-rays negative for bony abnormalities including fracture, or dislocation.    I have reviewed the x-rays myself and the radiologist interpretation. I am in agreement with the radiologist interpretation.     ASSESSMENT & PLAN:  1. Right elbow pain   2. Right elbow tendinitis      Meds ordered this encounter  Medications  . predniSONE (DELTASONE) 20 MG tablet    Sig: Take 1 tablet (20 mg total) by mouth 2 (two) times daily with a meal for 5 days.    Dispense:  10 tablet    Refill:  0    Order Specific Question:   Supervising Provider    Answer:   Raylene Everts [[id number]]  . cyclobenzaprine (FLEXERIL) 10 MG tablet    Sig: Take 1 tablet (10 mg total) by mouth at bedtime.    Dispense:  15 tablet    Refill:  0    Order Specific Question:   Supervising Provider    Answer:   Raylene Everts [[id number]]   X-rays  negative for fracture or dislocation Continue conservative management of rest, ice, and elevation Prednisone prescribed.   Take cyclobenzaprine at nighttime for symptomatic relief. Avoid driving or operating heavy machinery while using medication. Follow up with PCP or orthopedist for further evaluation and management Return or go to the ER if you have any new or worsening symptoms (fever, chills, chest pain, redness, swelling, bruising, deformity, etc...)   Reviewed expectations re: course of current medical issues. Questions answered. Outlined signs and symptoms indicating need for more acute  intervention. Patient verbalized understanding. After Visit Summary given.    Lestine Box, PA-C 12/28/19 1309

## 2019-12-28 NOTE — ED Triage Notes (Signed)
Pt presents with right elbow pain for past month , denies injury

## 2019-12-28 NOTE — Discharge Instructions (Signed)
X-rays negative for fracture or dislocation Continue conservative management of rest, ice, and elevation Prednisone prescribed.   Take cyclobenzaprine at nighttime for symptomatic relief. Avoid driving or operating heavy machinery while using medication. Follow up with PCP or orthopedist for further evaluation and management Return or go to the ER if you have any new or worsening symptoms (fever, chills, chest pain, redness, swelling, bruising, deformity, etc...)

## 2019-12-30 ENCOUNTER — Encounter: Payer: Self-pay | Admitting: Orthopaedic Surgery

## 2019-12-30 ENCOUNTER — Ambulatory Visit (INDEPENDENT_AMBULATORY_CARE_PROVIDER_SITE_OTHER): Payer: Medicaid Other | Admitting: Orthopaedic Surgery

## 2019-12-30 ENCOUNTER — Other Ambulatory Visit: Payer: Self-pay

## 2019-12-30 VITALS — BP 128/84 | HR 63 | Ht 59.0 in | Wt 174.0 lb

## 2019-12-30 DIAGNOSIS — M7701 Medial epicondylitis, right elbow: Secondary | ICD-10-CM

## 2019-12-30 NOTE — Progress Notes (Signed)
Subjective:    Patient ID: Kara Matthews, female    DOB: 1975/07/27, 44 y.o.   MRN: [id number]  HPI She has month long pain of the right elbow, medially. She was sweeping and developed pain of the elbow.  She went to Urgent Care on 12-28-19 for this.  I have reviewed the notes and X-rays. She was given a prednisone dose pack then.  I have independently reviewed and interpreted x-rays of this patient done at another site by another physician or qualified health professional.  She has pain that runs down to the little finger on the left but has no numbness.  She has no swelling, no redness.  She has no direct trauma to the elbow.  It bothers her all the time and she has had to be out of work.  She wants to return to work.  Advil, ice and heat have not helped.   Review of Systems  Constitutional: Positive for activity change.  Musculoskeletal: Positive for arthralgias.  All other systems reviewed and are negative.  For Review of Systems, all other systems reviewed and are negative.  The following is a summary of the past history medically, past history surgically, known current medicines, social history and family history.  This information is gathered electronically by the computer from prior information and documentation.  I review this each visit and have found including this information at this point in the chart is beneficial and informative.   Past Medical History:  Diagnosis Date  . Anxiety   . Aortic stenosis    Probable bicuspid aortic valve  . Essential hypertension   . Fibromyalgia   . Hyperlipidemia     Past Surgical History:  Procedure Laterality Date  . ABDOMINAL HYSTERECTOMY    . CESAREAN SECTION    . CHOLECYSTECTOMY      Current Outpatient Medications on File Prior to Visit  Medication Sig Dispense Refill  . bisoprolol-hydrochlorothiazide (ZIAC) 5-6.25 MG tablet Take 1 tablet by mouth daily.    . cyclobenzaprine (FLEXERIL) 10 MG tablet Take 1 tablet (10 mg  total) by mouth at bedtime. 15 tablet 0  . fenofibrate (TRICOR) 48 MG tablet Take 48 mg by mouth daily. Takes occasionally when she remembers.    Marland Kitchen PREMARIN 1.25 MG tablet TAKE 1 TABLET ONCE DAILY. 30 tablet 11  . gabapentin (NEURONTIN) 100 MG capsule Take 100 mg by mouth. Maybe takes twice a week. (Patient not taking: Reported on 12/30/2019)    . predniSONE (DELTASONE) 20 MG tablet Take 1 tablet (20 mg total) by mouth 2 (two) times daily with a meal for 5 days. (Patient not taking: Reported on 12/30/2019) 10 tablet 0   No current facility-administered medications on file prior to visit.    Social History   Socioeconomic History  . Marital status: Married    Spouse name: Not on file  . Number of children: Not on file  . Years of education: Not on file  . Highest education level: Not on file  Occupational History  . Not on file  Tobacco Use  . Smoking status: Current Every Day Smoker    Packs/day: 1.00    Years: 20.00    Pack years: 20.00    Types: Cigarettes  . Smokeless tobacco: Never Used  . Tobacco comment: maybe less than pack per day   Vaping Use  . Vaping Use: Never used  Substance and Sexual Activity  . Alcohol use: No    Alcohol/week: 0.0 standard drinks  .  Drug use: No  . Sexual activity: Yes    Birth control/protection: Surgical    Comment: hyst  Other Topics Concern  . Not on file  Social History Narrative  . Not on file   Social Determinants of Health   Financial Resource Strain:   . Difficulty of Paying Living Expenses: Not on file  Food Insecurity:   . Worried About Charity fundraiser in the Last Year: Not on file  . Ran Out of Food in the Last Year: Not on file  Transportation Needs:   . Lack of Transportation (Medical): Not on file  . Lack of Transportation (Non-Medical): Not on file  Physical Activity:   . Days of Exercise per Week: Not on file  . Minutes of Exercise per Session: Not on file  Stress:   . Feeling of Stress : Not on file  Social  Connections:   . Frequency of Communication with Friends and Family: Not on file  . Frequency of Social Gatherings with Friends and Family: Not on file  . Attends Religious Services: Not on file  . Active Member of Clubs or Organizations: Not on file  . Attends Archivist Meetings: Not on file  . Marital Status: Not on file  Intimate Partner Violence:   . Fear of Current or Ex-Partner: Not on file  . Emotionally Abused: Not on file  . Physically Abused: Not on file  . Sexually Abused: Not on file    Family History  Problem Relation Age of Onset  . Stroke Father   . Heart failure Brother   . Stroke Mother   . High blood pressure Mother     BP 128/84   Pulse 63   Ht 4\' 11"  (1.499 m)   Wt 174 lb (78.9 kg)   BMI 35.14 kg/m   Body mass index is 35.14 kg/m.      Objective:   Physical Exam Vitals and nursing note reviewed. Exam conducted with a chaperone present.  Constitutional:      Appearance: She is well-developed.  HENT:     Head: Normocephalic and atraumatic.  Eyes:     Conjunctiva/sclera: Conjunctivae normal.     Pupils: Pupils are equal, round, and reactive to light.  Cardiovascular:     Rate and Rhythm: Normal rate and regular rhythm.  Pulmonary:     Effort: Pulmonary effort is normal.  Abdominal:     Palpations: Abdomen is soft.  Musculoskeletal:       Arms:     Cervical back: Normal range of motion and neck supple.  Skin:    General: Skin is warm and dry.  Neurological:     Mental Status: She is alert and oriented to person, place, and time.     Cranial Nerves: No cranial nerve deficit.     Motor: No abnormal muscle tone.     Coordination: Coordination normal.     Deep Tendon Reflexes: Reflexes are normal and symmetric. Reflexes normal.  Psychiatric:        Behavior: Behavior normal.        Thought Content: Thought content normal.        Judgment: Judgment normal.           Assessment & Plan:   Encounter Diagnosis  Name  Primary?  . Medial epicondylitis, right Yes   Procedure note: After permission from the patient the right medial epicondyle was prepped and then injected with 1 % xylocaine and 1 cc DepoMedrol by sterile  technique tolerated well.  I have told her to take one Aleve twice a day after eating.  Use Aspercreme, biofreeze or voltaren gel to the area tid.  Return in one month.  Call if any problem.  Precautions discussed.   Electronically Signed Sanjuana Kava, MD 11/11/202110:54 AM

## 2019-12-30 NOTE — Patient Instructions (Signed)
Return to work Monday but if needs longer to be out, OK.

## 2020-01-15 IMAGING — DX DG ANKLE COMPLETE 3+V*L*
3 series · 3 of 3 positions shown · non-contrast
Comparison: None.

CLINICAL DATA: LOW BACK PAIN, LEFT ANKLE PAIN, PER ER NOTE, Pt
reports moving furniture recently and last night a pile of stuff
fell and she injured her left ankle and lower back. HISTORY OF HTN

EXAM:
LEFT ANKLE COMPLETE - 3+ VIEW

[ankle ap]
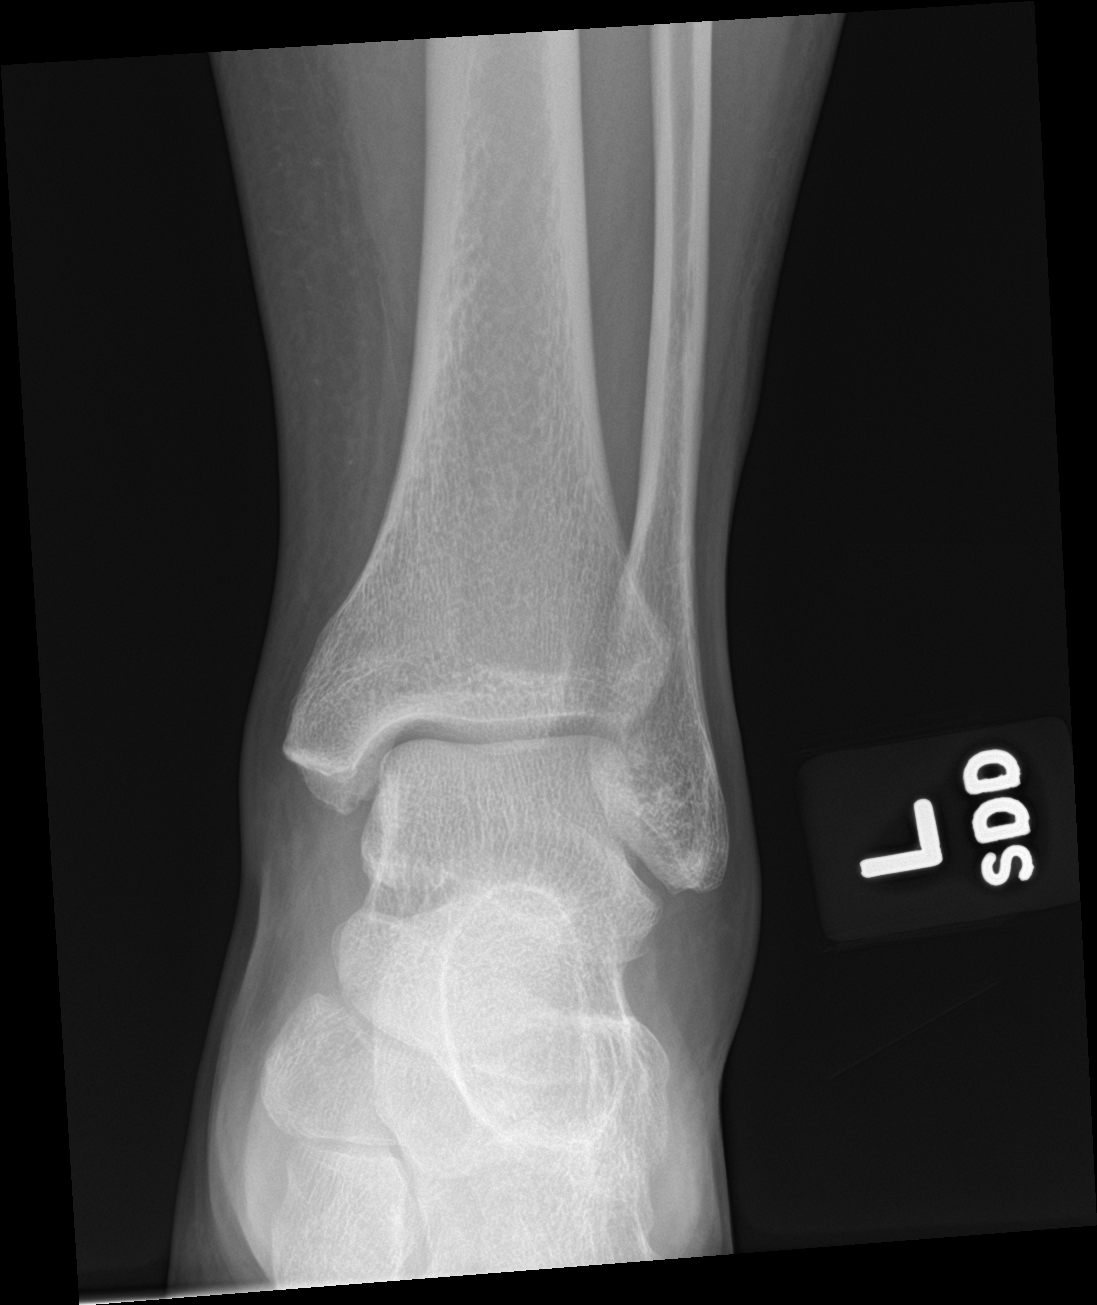

[ankle obl]
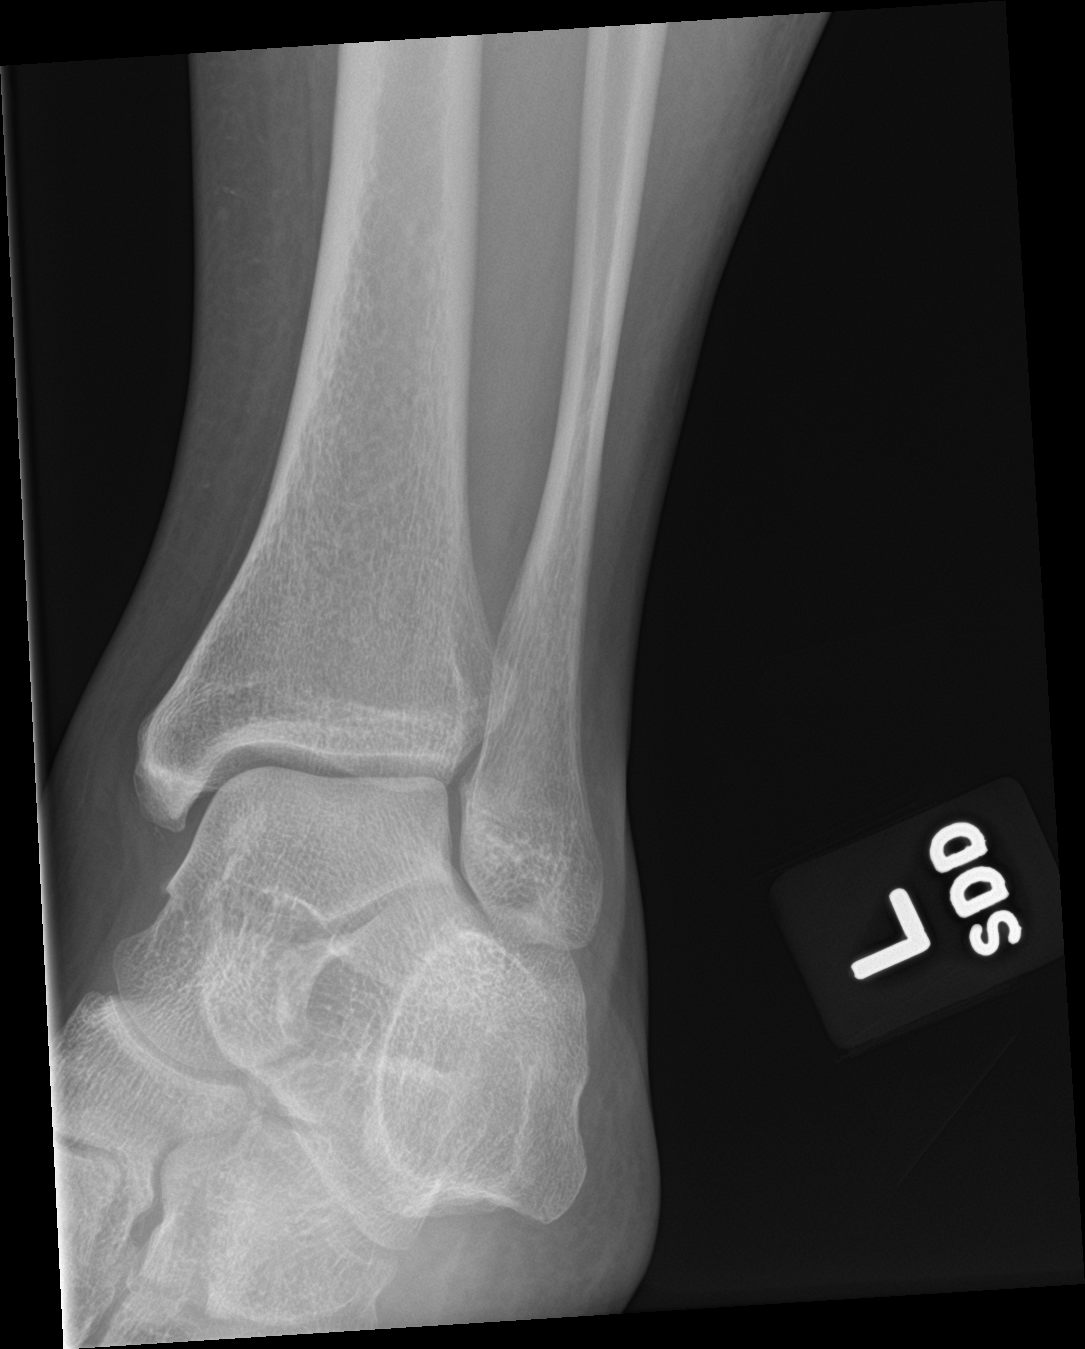

[ankle lat]
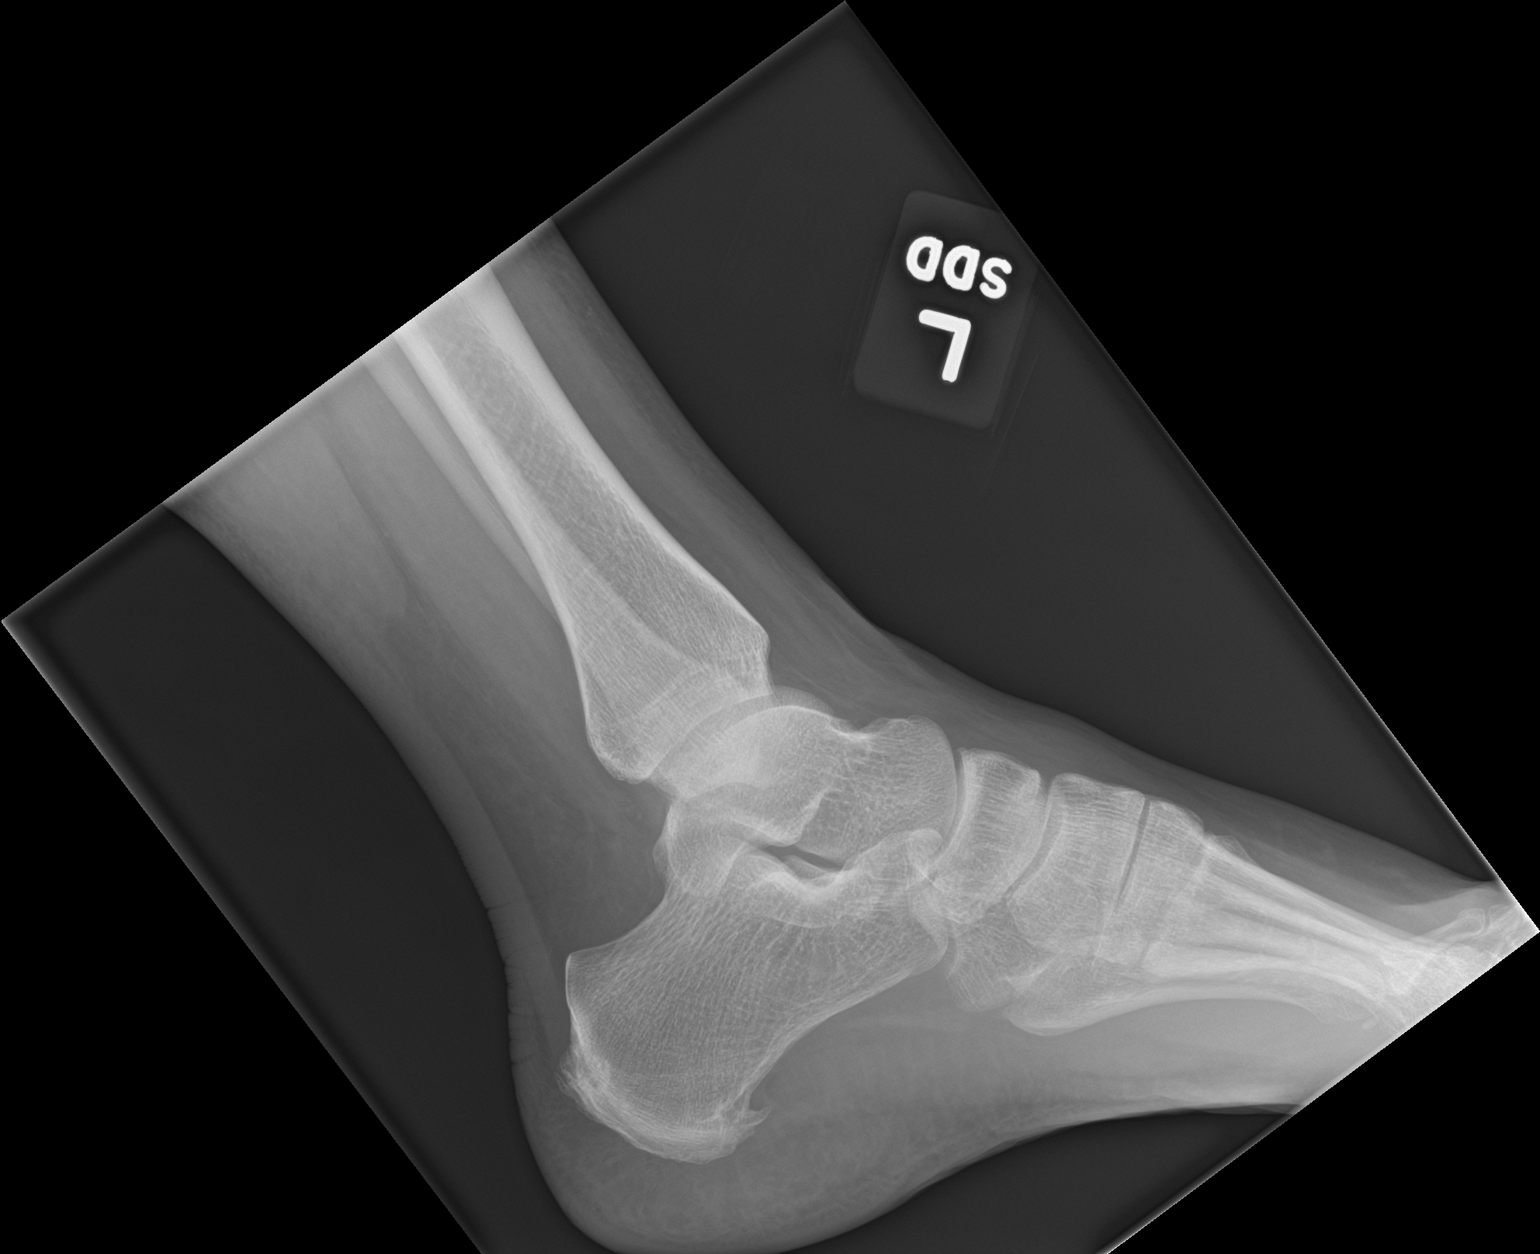

[3 of 3 positions shown; findings below may reference images not displayed]

FINDINGS: There is no evidence of fracture, dislocation, or joint effusion.
There is no evidence of arthropathy or other focal bone abnormality.
Soft tissues are unremarkable.
IMPRESSION: Negative.

## 2020-02-01 ENCOUNTER — Other Ambulatory Visit: Payer: Self-pay

## 2020-02-01 ENCOUNTER — Ambulatory Visit: Payer: Medicaid Other | Admitting: Orthopaedic Surgery

## 2020-02-01 ENCOUNTER — Encounter: Payer: Self-pay | Admitting: Orthopaedic Surgery

## 2020-02-20 ENCOUNTER — Other Ambulatory Visit: Payer: Self-pay

## 2020-02-20 ENCOUNTER — Emergency Department (HOSPITAL_COMMUNITY)
Admission: EM | Admit: 2020-02-20 | Discharge: 2020-02-21 | Disposition: A | Discharge status: Home / Self Care | Payer: Medicaid Other | Attending: Emergency Medicine | Admitting: Emergency Medicine

## 2020-02-20 ENCOUNTER — Encounter (HOSPITAL_COMMUNITY): Payer: Self-pay | Admitting: Emergency Medicine

## 2020-02-20 ENCOUNTER — Emergency Department (HOSPITAL_COMMUNITY): Payer: Medicaid Other

## 2020-02-20 DIAGNOSIS — Z79899 Other long term (current) drug therapy: Secondary | ICD-10-CM | POA: Diagnosis not present

## 2020-02-20 DIAGNOSIS — F17210 Nicotine dependence, cigarettes, uncomplicated: Secondary | ICD-10-CM | POA: Diagnosis not present

## 2020-02-20 DIAGNOSIS — R0602 Shortness of breath: Secondary | ICD-10-CM | POA: Insufficient documentation

## 2020-02-20 DIAGNOSIS — R0789 Other chest pain: Secondary | ICD-10-CM | POA: Diagnosis not present

## 2020-02-20 DIAGNOSIS — R0600 Dyspnea, unspecified: Secondary | ICD-10-CM

## 2020-02-20 DIAGNOSIS — Z20822 Contact with and (suspected) exposure to covid-19: Secondary | ICD-10-CM | POA: Insufficient documentation

## 2020-02-20 DIAGNOSIS — I10 Essential (primary) hypertension: Secondary | ICD-10-CM | POA: Insufficient documentation

## 2020-02-20 LAB — BASIC METABOLIC PANEL
Anion gap: 9 (ref 5–15)
BUN: 13 mg/dL (ref 6–20)
CO2: 25 mmol/L (ref 22–32)
Calcium: 9.2 mg/dL (ref 8.9–10.3)
Chloride: 103 mmol/L (ref 98–111)
Creatinine, Ser: 0.77 mg/dL (ref 0.44–1.00)
GFR, Estimated: >60 mL/min (ref >60)
Glucose, Bld: 98 mg/dL (ref 70–99)
Potassium: 3.4 mmol/L — ABNORMAL LOW (ref 3.5–5.1)
Sodium: 137 mmol/L (ref 135–145)

## 2020-02-20 LAB — CBC
HCT: 38.7 % (ref 36.0–46.0)
Hemoglobin: 12.9 g/dL (ref 12.0–15.0)
MCH: 31.7 pg (ref 26.0–34.0)
MCHC: 33.3 g/dL (ref 30.0–36.0)
MCV: 95.1 fL (ref 80.0–100.0)
Platelets: 169 10*3/uL (ref 150–400)
RBC: 4.07 MIL/uL (ref 3.87–5.11)
RDW: 12.5 % (ref 11.5–15.5)
WBC: 6.5 10*3/uL (ref 4.0–10.5)
nRBC: 0 % (ref 0.0–0.2)

## 2020-02-20 LAB — TROPONIN I (HIGH SENSITIVITY)
Troponin I (High Sensitivity): 4 ng/L (ref <18)
Troponin I (High Sensitivity): 4 ng/L (ref <18)

## 2020-02-20 NOTE — ED Triage Notes (Signed)
Pt states she has had shortness of breath with chest pain that began today after church.   Denies covid exposure.  Pt states she has non-radiating Chest pain is on the left side and described and pressure.

## 2020-02-20 NOTE — ED Provider Notes (Signed)
[facility] EMERGENCY DEPARTMENT Provider Note   CSN: [id number] Arrival date & time: 02/20/20  1945     History Chief Complaint  Patient presents with  . Shortness of Breath    Kara Matthews is a 45 y.o. female.  Patient is a 45 year old female with past medical history of hypertension, aortic stenosis, fibromyalgia, hyperlipidemia.  Patient presents today for evaluation of shortness of breath and chest discomfort.  This started earlier today after she attended church.  She describes a heavy sensation to the front of her chest with no radiation to the arm or jaw.  She denies any nausea or diaphoresis.  Patient denies any recent exertional symptoms.  She has seen a cardiologist in the past for what they believed to be a bicuspid aortic valve, but has no history of coronary artery disease.  The history is provided by the patient.  Shortness of Breath Severity:  Moderate Onset quality:  Sudden Timing:  Constant Progression:  Unchanged Chronicity:  New Relieved by:  Nothing Worsened by:  Nothing      Past Medical History:  Diagnosis Date  . Anxiety   . Aortic stenosis    Probable bicuspid aortic valve  . Essential hypertension   . Fibromyalgia   . Hyperlipidemia     Patient Active Problem List   Diagnosis Date Noted  . Lack of sexual desire 07/02/2016    Past Surgical History:  Procedure Laterality Date  . ABDOMINAL HYSTERECTOMY    . CESAREAN SECTION    . CHOLECYSTECTOMY       OB History    Gravida  2   Para  2   Term  2   Preterm      AB      Living  2     SAB      IAB      Ectopic      Multiple      Live Births  2           Family History  Problem Relation Age of Onset  . Stroke Father   . Heart failure Brother   . Stroke Mother   . High blood pressure Mother     Social History   Tobacco Use  . Smoking status: Current Every Day Smoker    Packs/day: 1.00    Years: 20.00    Pack years: 20.00    Types: Cigarettes  .  Smokeless tobacco: Never Used  . Tobacco comment: maybe less than pack per day   Vaping Use  . Vaping Use: Never used  Substance Use Topics  . Alcohol use: No    Alcohol/week: 0.0 standard drinks  . Drug use: No    Home Medications Prior to Admission medications   Medication Sig Start Date End Date Taking? Authorizing Provider  bisoprolol-hydrochlorothiazide (ZIAC) 5-6.25 MG tablet Take 1 tablet by mouth daily.    [provider]  cyclobenzaprine (FLEXERIL) 10 MG tablet Take 1 tablet (10 mg total) by mouth at bedtime. 12/28/19   Wurst, [place], PA-C  fenofibrate (TRICOR) 48 MG tablet Take 48 mg by mouth daily. Takes occasionally when she remembers.    [provider]  gabapentin (NEURONTIN) 100 MG capsule Take 100 mg by mouth. Maybe takes twice a week. Patient not taking: Reported on 12/30/2019    [provider]  PREMARIN 1.25 MG tablet TAKE 1 TABLET ONCE DAILY. 05/20/19   Florian Buff, MD    Allergies    Bactrim [sulfamethoxazole-trimethoprim], Ciprofloxacin,  Codeine, and Flagyl [metronidazole]  Review of Systems   Review of Systems  Respiratory: Positive for shortness of breath.   All other systems reviewed and are negative.   Physical Exam Updated Vital Signs BP (!) 155/81 (BP Location: Right Arm)   Pulse 74   Temp 98.5 F (36.9 C) (Oral)   Resp 18   Ht 4\' 11"  (1.499 m)   Wt 75.8 kg   SpO2 98%   BMI 33.73 kg/m   Physical Exam Vitals and nursing note reviewed.  Constitutional:      General: She is not in acute distress.    Appearance: She is well-developed and well-nourished. She is not diaphoretic.  HENT:     Head: Normocephalic and atraumatic.  Cardiovascular:     Rate and Rhythm: Normal rate and regular rhythm.     Heart sounds: No murmur heard. No friction rub. No gallop.   Pulmonary:     Effort: Pulmonary effort is normal. No respiratory distress.     Breath sounds: Normal breath sounds. No wheezing.  Abdominal:     General:  Bowel sounds are normal. There is no distension.     Palpations: Abdomen is soft.     Tenderness: There is no abdominal tenderness.  Musculoskeletal:        General: Normal range of motion.     Cervical back: Normal range of motion and neck supple.  Skin:    General: Skin is warm and dry.  Neurological:     Mental Status: She is alert and oriented to person, place, and time.     ED Results / Procedures / Treatments   Labs (all labs ordered are listed, but only abnormal results are displayed) Labs Reviewed  BASIC METABOLIC PANEL - Abnormal; Notable for the following components:      Result Value   Potassium 3.4 (*)    All other components within normal limits  RESP PANEL BY RT-PCR (FLU A&B, COVID) ARPGX2  CBC  TROPONIN I (HIGH SENSITIVITY)  TROPONIN I (HIGH SENSITIVITY)    EKG EKG Interpretation  Date/Time:  Sunday February 20 2020 19:50:22 EST Ventricular Rate:  73 PR Interval:  126 QRS Duration: 90 QT Interval:  398 QTC Calculation: 438 R Axis:   70 Text Interpretation: Normal sinus rhythm with sinus arrhythmia Nonspecific ST and T wave abnormality Abnormal ECG No significant change since 12/24/2016 Confirmed by 13/07/2016 (Geoffery Lyons) on 02/20/2020 11:54:02 PM   Radiology DG Chest 2 View  Result Date: 02/20/2020 CLINICAL DATA:  Shortness of breath EXAM: CHEST - 2 VIEW COMPARISON:  None. FINDINGS: The heart size and mediastinal contours are within normal limits. Both lungs are clear. The visualized skeletal structures are unremarkable. IMPRESSION: No active cardiopulmonary disease. Electronically Signed   By: 04/19/2020 M.D.   On: 02/20/2020 20:52    Procedures Procedures (including critical care time)  Medications Ordered in ED Medications - No data to display  ED Course  I have reviewed the triage vital signs and the nursing notes.  Pertinent labs & imaging results that were available during my care of the patient were reviewed by me and considered in my medical  decision making (see chart for details).    MDM Rules/Calculators/A&P  Patient presenting here with complaints of heaviness in her chest and difficulty breathing that started this afternoon.  I suspect this is related to bronchospasm/reactive airway as her work-up thus far is unremarkable.  Patient does smoke cigarettes regularly.  Work-up shows no evidence for a  cardiac etiology.  I have low suspicion for PE.  Her symptoms improved significantly after receiving albuterol by MDI.  Patient will be discharged with the MDI and advised to refrain from smoking.  She is to follow-up with primary doctor.  Final Clinical Impression(s) / ED Diagnoses Final diagnoses:  None    Rx / DC Orders ED Discharge Orders    None       Veryl Speak, MD 02/21/20 0150

## 2020-02-21 LAB — TROPONIN I (HIGH SENSITIVITY): Troponin I (High Sensitivity): 4 ng/L (ref <18)

## 2020-02-21 LAB — RESP PANEL BY RT-PCR (FLU A&B, COVID) ARPGX2
Influenza A by PCR: NEGATIVE
Influenza B by PCR: NEGATIVE
SARS Coronavirus 2 by RT PCR: NEGATIVE

## 2020-02-21 MED ORDER — ALBUTEROL SULFATE HFA 108 (90 BASE) MCG/ACT IN AERS
2.0000 | INHALATION_SPRAY | Freq: Once | RESPIRATORY_TRACT | Status: AC
  Administered 2020-02-21: 2 via RESPIRATORY_TRACT
  Filled 2020-02-21: qty 6.7

## 2020-02-21 NOTE — Discharge Instructions (Addendum)
Use the albuterol inhaler, 2 puffs every 4 hours as needed for difficulty breathing.  Follow-up with your primary doctor next week, and return to the ER if you develop severe chest pain, worsening breathing, or other new and concerning symptoms.

## 2020-04-18 ENCOUNTER — Ambulatory Visit (INDEPENDENT_AMBULATORY_CARE_PROVIDER_SITE_OTHER): Payer: Medicaid Other | Admitting: General Surgery

## 2020-04-18 ENCOUNTER — Encounter: Payer: Self-pay | Admitting: General Surgery

## 2020-04-18 ENCOUNTER — Other Ambulatory Visit: Payer: Self-pay

## 2020-04-18 VITALS — BP 116/75 | HR 60 | Temp 97.4°F | Resp 14 | Ht 59.0 in | Wt 174.0 lb

## 2020-04-18 DIAGNOSIS — D1723 Benign lipomatous neoplasm of skin and subcutaneous tissue of right leg: Secondary | ICD-10-CM

## 2020-04-18 NOTE — Patient Instructions (Signed)
Lipoma  A lipoma is a noncancerous (benign) tumor that is made up of fat cells. This is a very common type of soft-tissue growth. Lipomas are usually found under the skin (subcutaneous). They may occur in any tissue of the body that contains fat. Common areas for lipomas to appear include the back, arms, shoulders, buttocks, and thighs. Lipomas grow slowly, and they are usually painless. Most lipomas do not cause problems and do not require treatment. What are the causes? The cause of this condition is not known. What increases the risk? You are more likely to develop this condition if:  You are 4-70 years old.  You have a family history of lipomas. What are the signs or symptoms? A lipoma usually appears as a small, round bump under the skin. In most cases, the lump will:  Feel soft or rubbery.  Not cause pain or other symptoms. However, if a lipoma is located in an area where it pushes on nerves, it can become painful or cause other symptoms. How is this diagnosed? A lipoma can usually be diagnosed with a physical exam. You may also have tests to confirm the diagnosis and to rule out other conditions. Tests may include:  Imaging tests, such as a CT scan or an MRI.  Removal of a tissue sample to be looked at under a microscope (biopsy). How is this treated? Treatment for this condition depends on the size of the lipoma and whether it is causing any symptoms.  For small lipomas that are not causing problems, no treatment is needed.  If a lipoma is bigger or it causes problems, surgery may be done to remove the lipoma. Lipomas can also be removed to improve appearance. Most often, the procedure is done after applying a medicine that numbs the area (local anesthetic).  Liposuction may be done to reduce the size of the lipoma before it is removed through surgery, or it may be done to remove the lipoma. Lipomas are removed with this method in order to limit incision size and scarring. A  liposuction tube is inserted through a small incision into the lipoma, and the contents of the lipoma are removed through the tube with suction. Follow these instructions at home:  Watch your lipoma for any changes.  Keep all follow-up visits as told by your health care provider. This is important. Contact a health care provider if:  Your lipoma becomes larger or hard.  Your lipoma becomes painful, red, or increasingly swollen. These could be signs of infection or a more serious condition. Get help right away if:  You develop tingling or numbness in an area near the lipoma. This could indicate that the lipoma is causing nerve damage. Summary  A lipoma is a noncancerous tumor that is made up of fat cells.  Most lipomas do not cause problems and do not require treatment.  If a lipoma is bigger or it causes problems, surgery may be done to remove the lipoma.  Contact a health care provider if your lipoma becomes larger or hard, or if it becomes painful, red, or increasingly swollen. Pain, redness, and swelling could be signs of infection or a more serious condition. This information is not intended to replace advice given to you by your health care provider. Make sure you discuss any questions you have with your health care provider. Document Revised: 09/21/2018 Document Reviewed: 09/21/2018 Elsevier Patient Education  2021 [place].   Lipoma Removal  Lipoma removal is a surgical procedure to remove a  lipoma, which is a noncancerous (benign) tumor that is made up of fat cells. Most lipomas are small and painless and do not require treatment. They can form in many areas of the body but are most common under the skin of the back, arms, shoulders, buttocks, and thighs. You may need lipoma removal if you have a lipoma that is large, growing, or causing discomfort. Lipoma removal may also be done for cosmetic reasons. Tell a health care provider about:  Any allergies you have.  All  medicines you are taking, including vitamins, herbs, eye drops, creams, and over-the-counter medicines.  Any problems you or family members have had with anesthetic medicines.  Any blood disorders you have.  Any surgeries you have had.  Any medical conditions you have.  Whether you are pregnant or may be pregnant. What are the risks? Generally, this is a safe procedure. However, problems may occur, including:  Infection.  Bleeding.  Scarring.  Allergic reactions to medicines.  Damage to nearby structures or organs, such as damage to nerves or blood vessels near the lipoma. Medicines Ask your health care provider about:  Changing or stopping your regular medicines. This is especially important if you are taking diabetes medicines or blood thinners.  Taking medicines such as aspirin and ibuprofen. These medicines can thin your blood. Do not take these medicines unless your health care provider tells you to take them.  Taking over-the-counter medicines, vitamins, herbs, and supplements. General instructions  You will have a physical exam. Your health care provider will check the size of the lipoma and whether it can be moved easily.  You may have a biopsy and imaging tests, such as X-rays, a CT scan, and an MRI.  Do not use any products that contain nicotine or tobacco for at least 4 weeks before the procedure. These products include cigarettes, e-cigarettes, and chewing tobacco. If you need help quitting, ask your health care provider.  Ask your health care provider: ? How your surgery site will be marked. ? What steps will be taken to help prevent infection. These may include:  Washing skin with a germ-killing soap.  Taking antibiotic medicine.  Plan to have someone take you home from the hospital or clinic.  If you will be going home right after the procedure, plan to have someone with you for 24 hours. What happens during the procedure?  An IV will be inserted  into one of your veins.  You will be given one or more of the following: ? A medicine to help you relax (sedative). ? A medicine to numb the area (local anesthetic). ? A medicine to make you fall asleep (general anesthetic). ? A medicine that is injected into an area of your body to numb everything below the injection site (regional anesthetic).  An incision will be made over the lipoma or very near the lipoma. The incision may be made in a natural skin line or crease.  Tissues, nerves, and blood vessels near the lipoma will be moved out of the way.  The lipoma and the capsule that surrounds it will be separated from the surrounding tissues.  The lipoma will be removed.  The incision may be closed with stitches (sutures).  A bandage (dressing) will be placed over the incision. The procedure may vary among health care providers and hospitals.   What happens after the procedure?  Your blood pressure, heart rate, breathing rate, and blood oxygen level will be monitored until you leave the hospital or clinic.  If you were prescribed an antibiotic medicine, use it as told by your health care provider. Do not stop using the antibiotic even if you start to feel better.  If you were given a sedative during the procedure, it can affect you for several hours. Do not drive or operate machinery until your health care provider says that it is safe. Summary  Before the procedure, follow instructions from your health care provider about eating and drinking, and changing or stopping your regular medicines. This is especially important if you are taking diabetes medicines or blood thinners.  After the lipoma is removed, the incision may be closed with stitches (sutures) and covered with a bandage (dressing).  If you were given a sedative during the procedure, it can affect you for several hours. Do not drive or operate machinery until your health care provider says that it is safe. This information  is not intended to replace advice given to you by your health care provider. Make sure you discuss any questions you have with your health care provider. Document Revised: 09/21/2018 Document Reviewed: 09/21/2018 Elsevier Patient Education  [place].

## 2020-04-18 NOTE — Progress Notes (Signed)
[facility] History and Physical  Reason for Referral: Right thigh lipoma  Referring Physician: Raiford Simmonds., PA-C   Chief Complaint    Lipoma      NIKITA SURMAN is a 45 y.o. female.  HPI:  Ms. Spainhower is a 45 yo with a history of a right thigh mass that she reports being smaller than a golf ball and now being larger than a tennis ball. She has noticed this for years.  She says that she has some pain and discomfort in the leg and that when she is standing for long periods of time her leg gives out. She does report a history of sciatica.  She has a history of a bicuspid aortic valve and is followed by Cardiology. She has not symptoms or chest pain. She gets ECHOs and follows annually.    Past Medical History:  Diagnosis Date  . Anxiety   . Aortic stenosis    Probable bicuspid aortic valve  . Essential hypertension   . Fibromyalgia   . Hyperlipidemia     Past Surgical History:  Procedure Laterality Date  . ABDOMINAL HYSTERECTOMY    . CESAREAN SECTION    . CHOLECYSTECTOMY      Family History  Problem Relation Age of Onset  . Stroke Father   . Heart failure Brother   . Stroke Mother   . High blood pressure Mother     Social History   Tobacco Use  . Smoking status: Current Every Day Smoker    Packs/day: 1.00    Years: 20.00    Pack years: 20.00    Types: Cigarettes  . Smokeless tobacco: Never Used  . Tobacco comment: maybe less than pack per day   Vaping Use  . Vaping Use: Never used  Substance Use Topics  . Alcohol use: No    Alcohol/week: 0.0 standard drinks  . Drug use: No    Medications: I have reviewed the patient's current medications. Allergies as of 04/18/2020      Reactions   Bactrim [sulfamethoxazole-trimethoprim]    Ciprofloxacin Hives   Codeine Other (See Comments)   Stomach upset.   Flagyl [metronidazole] Other (See Comments)   Taste of "metal" in her mouth      Medication List       Accurate as of April 18, 2020   9:54 AM. If you have any questions, ask your nurse or doctor.        STOP taking these medications   gabapentin 100 MG capsule Commonly known as: NEURONTIN Stopped by: Virl Cagey, MD     TAKE these medications   bisoprolol-hydrochlorothiazide 5-6.25 MG tablet Commonly known as: ZIAC Take 1 tablet by mouth daily.   cyclobenzaprine 10 MG tablet Commonly known as: FLEXERIL Take 1 tablet (10 mg total) by mouth at bedtime. What changed:   when to take this  reasons to take this   fenofibrate 48 MG tablet Commonly known as: TRICOR Take 48 mg by mouth daily as needed. Takes occasionally when she remembers.   omeprazole 20 MG capsule Commonly known as: PRILOSEC Take 20 mg by mouth daily.   Premarin 1.25 MG tablet Generic drug: estrogens (conjugated) TAKE 1 TABLET ONCE DAILY.        ROS:  A comprehensive review of systems was negative except for: Gastrointestinal: positive for reflux symptoms Musculoskeletal: positive for back pain and joint pain, pain in the right leg, right leg gives out  Blood pressure 116/75, pulse 60, temperature (!)  97.4 F (36.3 C), temperature source Other (Comment), resp. rate 14, height 4\' 11"  (1.499 m), weight 174 lb (78.9 kg), SpO2 97 %. Physical Exam Vitals reviewed.  Constitutional:      Appearance: Normal appearance.  HENT:     Head: Normocephalic.     Nose: Nose normal.  Eyes:     Extraocular Movements: Extraocular movements intact.  Cardiovascular:     Rate and Rhythm: Normal rate and regular rhythm.  Pulmonary:     Effort: Pulmonary effort is normal.     Breath sounds: Normal breath sounds.  Abdominal:     General: There is no distension.     Palpations: Abdomen is soft.     Tenderness: There is no abdominal tenderness.  Musculoskeletal:        General: No swelling. Normal range of motion.     Comments: Right upper thigh with 6cm, superficial, soft, well demarcated area consistent with lipoma  Skin:    General: Skin  is warm.  Neurological:     General: No focal deficit present.     Mental Status: She is alert and oriented to person, place, and time.  Psychiatric:        Mood and Affect: Mood normal.        Behavior: Behavior normal.        Thought Content: Thought content normal.        Judgment: Judgment normal.     Results: None   Tried to review old CT pelvis but not inferior enough cuts   Assessment & Plan:  ZAHLIA DESHAZER is a 45 y.o. female with a large mass on the right thigh that has been growing but has been there for years. She wants to get it removed due to the discomfort.  We discussed excision and risk of bleeding, infection, recurrence, and finding something unexpected. Discussed that her symptoms and especially the leg giving out may not be related to the lipoma.   -Discussed her probable bicuspid valve that is stable and no cardiac symptoms or meds, will send Dr. Domenic Polite a message to ensure he does not need to see her prior to the procedure -Discussed preop COVID testing  All questions were answered to the satisfaction of the patient.   Virl Cagey 04/18/2020, 9:54 AM

## 2020-04-19 NOTE — H&P (Signed)
[facility] History and Physical  Reason for Referral: Right thigh lipoma  Referring Physician: Raiford Simmonds., PA-C   Chief Complaint    Lipoma      Kara Matthews is a 45 y.o. female.  HPI:  Kara Matthews is a 46 yo with a history of a right thigh mass that she reports being smaller than a golf ball and now being larger than a tennis ball. She has noticed this for years.  She says that she has some pain and discomfort in the leg and that when she is standing for long periods of time her leg gives out. She does report a history of sciatica.  She has a history of a bicuspid aortic valve and is followed by Cardiology. She has not symptoms or chest pain. She gets ECHOs and follows annually.    Past Medical History:  Diagnosis Date  . Anxiety   . Aortic stenosis    Probable bicuspid aortic valve  . Essential hypertension   . Fibromyalgia   . Hyperlipidemia     Past Surgical History:  Procedure Laterality Date  . ABDOMINAL HYSTERECTOMY    . CESAREAN SECTION    . CHOLECYSTECTOMY      Family History  Problem Relation Age of Onset  . Stroke Father   . Heart failure Brother   . Stroke Mother   . High blood pressure Mother     Social History   Tobacco Use  . Smoking status: Current Every Day Smoker    Packs/day: 1.00    Years: 20.00    Pack years: 20.00    Types: Cigarettes  . Smokeless tobacco: Never Used  . Tobacco comment: maybe less than pack per day   Vaping Use  . Vaping Use: Never used  Substance Use Topics  . Alcohol use: No    Alcohol/week: 0.0 standard drinks  . Drug use: No    Medications: I have reviewed the patient's current medications. Allergies as of 04/18/2020      Reactions   Bactrim [sulfamethoxazole-trimethoprim]    Ciprofloxacin Hives   Codeine Other (See Comments)   Stomach upset.   Flagyl [metronidazole] Other (See Comments)   Taste of "metal" in her mouth      Medication List       Accurate as of April 18, 2020   9:54 AM. If you have any questions, ask your nurse or doctor.        STOP taking these medications   gabapentin 100 MG capsule Commonly known as: NEURONTIN Stopped by: Virl Cagey, MD     TAKE these medications   bisoprolol-hydrochlorothiazide 5-6.25 MG tablet Commonly known as: ZIAC Take 1 tablet by mouth daily.   cyclobenzaprine 10 MG tablet Commonly known as: FLEXERIL Take 1 tablet (10 mg total) by mouth at bedtime. What changed:   when to take this  reasons to take this   fenofibrate 48 MG tablet Commonly known as: TRICOR Take 48 mg by mouth daily as needed. Takes occasionally when she remembers.   omeprazole 20 MG capsule Commonly known as: PRILOSEC Take 20 mg by mouth daily.   Premarin 1.25 MG tablet Generic drug: estrogens (conjugated) TAKE 1 TABLET ONCE DAILY.        ROS:  A comprehensive review of systems was negative except for: Gastrointestinal: positive for reflux symptoms Musculoskeletal: positive for back pain and joint pain, pain in the right leg, right leg gives out  Blood pressure 116/75, pulse 60, temperature (!)  97.4 F (36.3 C), temperature source Other (Comment), resp. rate 14, height 4\' 11"  (1.499 m), weight 174 lb (78.9 kg), SpO2 97 %. Physical Exam Vitals reviewed.  Constitutional:      Appearance: Normal appearance.  HENT:     Head: Normocephalic.     Nose: Nose normal.  Eyes:     Extraocular Movements: Extraocular movements intact.  Cardiovascular:     Rate and Rhythm: Normal rate and regular rhythm.  Pulmonary:     Effort: Pulmonary effort is normal.     Breath sounds: Normal breath sounds.  Abdominal:     General: There is no distension.     Palpations: Abdomen is soft.     Tenderness: There is no abdominal tenderness.  Musculoskeletal:        General: No swelling. Normal range of motion.     Comments: Right upper thigh with 6cm, superficial, soft, well demarcated area consistent with lipoma  Skin:    General: Skin  is warm.  Neurological:     General: No focal deficit present.     Mental Status: She is alert and oriented to person, place, and time.  Psychiatric:        Mood and Affect: Mood normal.        Behavior: Behavior normal.        Thought Content: Thought content normal.        Judgment: Judgment normal.     Results: None   Tried to review old CT pelvis but not inferior enough cuts   Assessment & Plan:  Kara Matthews is a 45 y.o. female with a large mass on the right thigh that has been growing but has been there for years. She wants to get it removed due to the discomfort.  We discussed excision and risk of bleeding, infection, recurrence, and finding something unexpected. Discussed that her symptoms and especially the leg giving out may not be related to the lipoma.   -Discussed her probable bicuspid valve that is stable and no cardiac symptoms or meds, will send Dr. Domenic Polite a message to ensure he does not need to see her prior to the procedure -Discussed preop COVID testing  All questions were answered to the satisfaction of the patient.   Virl Cagey 04/18/2020, 9:54 AM

## 2020-04-27 ENCOUNTER — Other Ambulatory Visit: Payer: Self-pay

## 2020-04-27 ENCOUNTER — Encounter: Payer: Self-pay | Admitting: Orthopaedic Surgery

## 2020-04-27 ENCOUNTER — Ambulatory Visit (INDEPENDENT_AMBULATORY_CARE_PROVIDER_SITE_OTHER): Payer: Medicaid Other | Admitting: Orthopaedic Surgery

## 2020-04-27 VITALS — BP 126/81 | HR 62 | Ht 59.0 in | Wt 174.6 lb

## 2020-04-27 DIAGNOSIS — M7701 Medial epicondylitis, right elbow: Secondary | ICD-10-CM | POA: Diagnosis not present

## 2020-04-27 MED ORDER — HYDROCODONE-ACETAMINOPHEN 5-325 MG PO TABS: ORAL_TABLET | ORAL | 0 refills | Status: DC

## 2020-04-27 MED ORDER — NAPROXEN 500 MG PO TABS: 500.0000 mg | ORAL_TABLET | Freq: Two times a day (BID) | ORAL | 5 refills | Status: DC

## 2020-04-27 NOTE — Progress Notes (Signed)
Patient DD:UKGURK D Kara Matthews, female DOB:1975/11/24, 45 y.o. YHC:[id number]  Chief Complaint  Patient presents with  . Elbow Pain    Right side,      HPI  Kara Matthews is a 45 y.o. 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  It has been very painful over the last month. She has no paresthesias.  She has tried ice massage and has an elbow brace.  She has no new trauma or redness.   Body mass index is 35.26 kg/m.  ROS  Review of Systems  Constitutional: Positive for activity change.  Musculoskeletal: Positive for arthralgias.  All other systems reviewed and are negative.   All other systems reviewed and are negative.  The following is a summary of the past history medically, past history surgically, known current medicines, social history and family history.  This information is gathered electronically by the computer from prior information and documentation.  I review this each visit and have found including this information at this point in the chart is beneficial and informative.    Past Medical History:  Diagnosis Date  . Anxiety   . Aortic stenosis    Probable bicuspid aortic valve  . Essential hypertension   . Fibromyalgia   . Hyperlipidemia     Past Surgical History:  Procedure Laterality Date  . ABDOMINAL HYSTERECTOMY    . CESAREAN SECTION    . CHOLECYSTECTOMY      Family History  Problem Relation Age of Onset  . Stroke Father   . Heart failure Brother   . Stroke Mother   . High blood pressure Mother     Social History Social History   Tobacco Use  . Smoking status: Current Every Day Smoker    Packs/day: 1.00    Years: 20.00    Pack years: 20.00    Types: Cigarettes  . Smokeless tobacco: Never Used  . Tobacco comment: maybe less than pack per day   Vaping Use  . Vaping Use: Never used  Substance Use Topics  . Alcohol use: No    Alcohol/week: 0.0 standard drinks  . Drug use: No    Allergies  Allergen Reactions  .  Bactrim [Sulfamethoxazole-Trimethoprim] Hives  . Ciprofloxacin Hives  . Codeine Other (See Comments)    Stomach upset.  . Flagyl [Metronidazole] Other (See Comments)    Taste of "metal" in her mouth     Current Outpatient Medications  Medication Sig Dispense Refill  . bisoprolol-hydrochlorothiazide (ZIAC) 5-6.25 MG tablet Take 1 tablet by mouth in the morning and at bedtime.    . cetirizine (ZYRTEC) 10 MG tablet Take 10 mg by mouth daily.    . diphenhydramine-acetaminophen (TYLENOL PM) 25-500 MG TABS tablet Take 2 tablets by mouth at bedtime as needed (pain).    . fenofibrate (TRICOR) 48 MG tablet Take 48 mg by mouth daily as needed. Takes occasionally when she remembers.    . fluticasone (FLONASE) 50 MCG/ACT nasal spray Place 1 spray into both nostrils daily.    Marland Kitchen HYDROcodone-acetaminophen (NORCO/VICODIN) 5-325 MG tablet One tablet every four hours for pain. 30 tablet 0  . melatonin 5 MG TABS Take 10 mg by mouth at bedtime.    . Multiple Vitamin (MULTIVITAMIN WITH MINERALS) TABS tablet Take 1 tablet by mouth 3 (three) times a week.    . naproxen (NAPROSYN) 500 MG tablet Take 1 tablet (500 mg total) by mouth 2 (two) times daily with a meal. 60 tablet 5  . omeprazole (PRILOSEC)  20 MG capsule Take 20 mg by mouth daily.    Marland Kitchen PREMARIN 1.25 MG tablet TAKE 1 TABLET ONCE DAILY. (Patient taking differently: Take 1.25 mg by mouth at bedtime.) 30 tablet 11   No current facility-administered medications for this visit.     Physical Exam  Blood pressure 126/81, pulse 62, height 4\' 11"  (1.499 m), weight 174 lb 9.6 oz (79.2 kg).  Constitutional: overall normal hygiene, normal nutrition, well developed, normal grooming, normal body habitus. Assistive device:Elbor brace right  Musculoskeletal: gait and station Limp none, muscle tone and strength are normal, no tremors or atrophy is present.  .  Neurological: coordination overall normal.  Deep tendon reflex/nerve stretch intact.  Sensation normal.   Cranial nerves II-XII intact.   Skin:   Normal overall no scars, lesions, ulcers or rashes. No psoriasis.  Psychiatric: Alert and oriented x 3.  Recent memory intact, remote memory unclear.  Normal mood and affect. Well groomed.  Good eye contact.  Cardiovascular: overall no swelling, no varicosities, no edema bilaterally, normal temperatures of the legs and arms, no clubbing, cyanosis and good capillary refill.  Lymphatic: palpation is normal.  Right elbow is very tender over the medial epicondyle.  ROM is full.  NV intact.  Ulnar nerve intact.  All other systems reviewed and are negative   The patient has been educated about the nature of the problem(s) and counseled on treatment options.  The patient appeared to understand what I have discussed and is in agreement with it.  Encounter Diagnosis  Name Primary?  . Medial epicondylitis, right Yes   Procedure note: After permission from the patient the medial right elbow was prepped. I injected 1 % Xylocaine plain and 1 cc of Celestone 6mg m into the medial epicondyle area by sterile technique tolerated well.   PLAN Call if any problems.  Precautions discussed.  Continue current medications. I will add naprosyn 500 po bid and Norco 5.  Return to clinic 2 weeks   I have reviewed the [place] web site prior to prescribing narcotic medicine for this patient.   Electronically Signed Sanjuana Kava, MD 3/10/20229:12 AM

## 2020-05-01 NOTE — Patient Instructions (Signed)
Your procedure is scheduled on: 05/05/2020  Report to [facility] at 6:15    AM.  Call this number if you have problems the morning of surgery: [phone]   Remember:   Do not Eat or Drink after midnight         No Smoking the morning of surgery  :  Take these medicines the morning of surgery with A SIP OF WATER: Omeprazole.   Flonase and hydrocodone if needed   Do not wear jewelry, make-up or nail polish.  Do not wear lotions, powders, or perfumes. You may wear deodorant.  Do not shave 48 hours prior to surgery. Men may shave face and neck.  Do not bring valuables to the hospital.  Contacts, dentures or bridgework may not be worn into surgery.  Leave suitcase in the car. After surgery it may be brought to your room.  For patients admitted to the hospital, checkout time is 11:00 AM the day of discharge.   Patients discharged the day of surgery will not be allowed to drive home.    Special Instructions: Shower using CHG night before surgery and shower the day of surgery use CHG.  Use special wash - you have one bottle of CHG for all showers.  You should use approximately 1/2 of the bottle for each shower.  How to Use Chlorhexidine for Bathing Chlorhexidine gluconate (CHG) is a germ-killing (antiseptic) solution that is used to clean the skin. It can get rid of the bacteria that normally live on the skin and can keep them away for about 24 hours. To clean your skin with CHG, you may be given:  A CHG solution to use in the shower or as part of a sponge bath.  A prepackaged cloth that contains CHG. Cleaning your skin with CHG may help lower the risk for infection:  While you are staying in the intensive care unit of the hospital.  If you have a vascular access, such as a central line, to provide short-term or long-term access to your veins.  If you have a catheter to drain urine from your bladder.  If you are on a ventilator. A ventilator is a machine that helps you breathe by  moving air in and out of your lungs.  After surgery. What are the risks? Risks of using CHG include:  A skin reaction.  Hearing loss, if CHG gets in your ears.  Eye injury, if CHG gets in your eyes and is not rinsed out.  The CHG product catching fire. Make sure that you avoid smoking and flames after applying CHG to your skin. Do not use CHG:  If you have a chlorhexidine allergy or have previously reacted to chlorhexidine.  On babies younger than 85 months of age. How to use CHG solution  Use CHG only as told by your health care provider, and follow the instructions on the label.  Use the full amount of CHG as directed. Usually, this is one bottle. During a shower Follow these steps when using CHG solution during a shower (unless your health care provider gives you different instructions): 1. Start the shower. 2. Use your normal soap and shampoo to wash your face and hair. 3. Turn off the shower or move out of the shower stream. 4. Pour the CHG onto a clean washcloth. Do not use any type of brush or rough-edged sponge. 5. Starting at your neck, lather your body down to your toes. Make sure you follow these instructions: ? If you  will be having surgery, pay special attention to the part of your body where you will be having surgery. Scrub this area for at least 1 minute. ? Do not use CHG on your head or face. If the solution gets into your ears or eyes, rinse them well with water. ? Avoid your genital area. ? Avoid any areas of skin that have broken skin, cuts, or scrapes. ? Scrub your back and under your arms. Make sure to wash skin folds. 6. Let the lather sit on your skin for 1-2 minutes or as long as told by your health care provider. 7. Thoroughly rinse your entire body in the shower. Make sure that all body creases and crevices are rinsed well. 8. Dry off with a clean towel. Do not put any substances on your body afterward--such as powder, lotion, or perfume--unless you are  told to do so by your health care provider. Only use lotions that are recommended by the manufacturer. 9. Put on clean clothes or pajamas. 10. If it is the night before your surgery, sleep in clean sheets.   During a sponge bath Follow these steps when using CHG solution during a sponge bath (unless your health care provider gives you different instructions): 1. Use your normal soap and shampoo to wash your face and hair. 2. Pour the CHG onto a clean washcloth. 3. Starting at your neck, lather your body down to your toes. Make sure you follow these instructions: ? If you will be having surgery, pay special attention to the part of your body where you will be having surgery. Scrub this area for at least 1 minute. ? Do not use CHG on your head or face. If the solution gets into your ears or eyes, rinse them well with water. ? Avoid your genital area. ? Avoid any areas of skin that have broken skin, cuts, or scrapes. ? Scrub your back and under your arms. Make sure to wash skin folds. 4. Let the lather sit on your skin for 1-2 minutes or as long as told by your health care provider. 5. Using a different clean, wet washcloth, thoroughly rinse your entire body. Make sure that all body creases and crevices are rinsed well. 6. Dry off with a clean towel. Do not put any substances on your body afterward--such as powder, lotion, or perfume--unless you are told to do so by your health care provider. Only use lotions that are recommended by the manufacturer. 7. Put on clean clothes or pajamas. 8. If it is the night before your surgery, sleep in clean sheets. How to use CHG prepackaged cloths  Only use CHG cloths as told by your health care provider, and follow the instructions on the label.  Use the CHG cloth on clean, dry skin.  Do not use the CHG cloth on your head or face unless your health care provider tells you to.  When washing with the CHG cloth: ? Avoid your genital area. ? Avoid any areas  of skin that have broken skin, cuts, or scrapes. Before surgery Follow these steps when using a CHG cloth to clean before surgery (unless your health care provider gives you different instructions): 1. Using the CHG cloth, vigorously scrub the part of your body where you will be having surgery. Scrub using a back-and-forth motion for 3 minutes. The area on your body should be completely wet with CHG when you are done scrubbing. 2. Do not rinse. Discard the cloth and let the area air-dry. Do  not put any substances on the area afterward, such as powder, lotion, or perfume. 3. Put on clean clothes or pajamas. 4. If it is the night before your surgery, sleep in clean sheets.   For general bathing Follow these steps when using CHG cloths for general bathing (unless your health care provider gives you different instructions). 1. Use a separate CHG cloth for each area of your body. Make sure you wash between any folds of skin and between your fingers and toes. Wash your body in the following order, switching to a new cloth after each step: ? The front of your neck, shoulders, and chest. ? Both of your arms, under your arms, and your hands. ? Your stomach and groin area, avoiding the genitals. ? Your right leg and foot. ? Your left leg and foot. ? The back of your neck, your back, and your buttocks. 2. Do not rinse. Discard the cloth and let the area air-dry. Do not put any substances on your body afterward--such as powder, lotion, or perfume--unless you are told to do so by your health care provider. Only use lotions that are recommended by the manufacturer. 3. Put on clean clothes or pajamas. Contact a health care provider if:  Your skin gets irritated after scrubbing.  You have questions about using your solution or cloth. Get help right away if:  Your eyes become very red or swollen.  Your eyes itch badly.  Your skin itches badly and is red or swollen.  Your hearing changes.  You have  trouble seeing.  You have swelling or tingling in your mouth or throat.  You have trouble breathing.  You swallow any chlorhexidine. Summary  Chlorhexidine gluconate (CHG) is a germ-killing (antiseptic) solution that is used to clean the skin. Cleaning your skin with CHG may help to lower your risk for infection.  You may be given CHG to use for bathing. It may be in a bottle or in a prepackaged cloth to use on your skin. Carefully follow your health care provider's instructions and the instructions on the product label.  Do not use CHG if you have a chlorhexidine allergy.  Contact your health care provider if your skin gets irritated after scrubbing. This information is not intended to replace advice given to you by your health care provider. Make sure you discuss any questions you have with your health care provider. Document Revised: 07/23/2019 Document Reviewed: 07/23/2019 Elsevier Patient Education  2021 [place], Adult Taking care of your wound properly can help to prevent pain, infection, and scarring. It can also help your wound heal more quickly. Follow instructions from your health care provider about how to care for your wound. Supplies needed:  Soap and water.  Wound cleanser.  Gauze.  If needed, a clean bandage (dressing) or other type of wound dressing material to cover or place in the wound. Follow your health care provider's instructions about what dressing supplies to use.  Cream or ointment to apply to the wound, if told by your health care provider. How to care for your wound Cleaning the wound Ask your health care provider how to clean the wound. This may include:  Using mild soap and water or a wound cleanser.  Using a clean gauze to pat the wound dry after cleaning it. Do not rub or scrub the wound. Dressing care  Wash your hands with soap and water for at least 20 seconds before and after you change the dressing. If soap  and water are  not available, use hand sanitizer.  Change your dressing as told by your health care provider. This may include: ? Cleaning or rinsing out (irrigating) the wound. ? Placing a dressing over the wound or in the wound (packing). ? Covering the wound with an outer dressing.  Leave any stitches (sutures), skin glue, or adhesive strips in place. These skin closures may need to stay in place for 2 weeks or longer. If adhesive strip edges start to loosen and curl up, you may trim the loose edges. Do not remove adhesive strips completely unless your health care provider tells you to do that.  Ask your health care provider when you can leave the wound uncovered. Checking for infection Check your wound area every day for signs of infection. Check for:  More redness, swelling, or pain.  Fluid or blood.  Warmth.  Pus or a bad smell.   Follow these instructions at home Medicines  If you were prescribed an antibiotic medicine, cream, or ointment, take or apply it as told by your health care provider. Do not stop using the antibiotic even if your condition improves.  If you were prescribed pain medicine, take it 30 minutes before you do any wound care or as told by your health care provider.  Take over-the-counter and prescription medicines only as told by your health care provider. Eating and drinking  Eat a diet that includes protein, vitamin A, vitamin C, and other nutrient-rich foods to help the wound heal. ? Foods rich in protein include meat, fish, eggs, dairy, beans, and nuts. ? Foods rich in vitamin A include carrots and dark green, leafy vegetables. ? Foods rich in vitamin C include citrus fruits, tomatoes, broccoli, and peppers.  Drink enough fluid to keep your urine pale yellow. General instructions  Do not take baths, swim, use a hot tub, or do anything that would put the wound underwater until your health care provider approves. Ask your health care provider if you may take  showers. You may only be allowed to take sponge baths.  Do not scratch or pick at the wound. Keep it covered as told by your health care provider.  Return to your normal activities as told by your health care provider. Ask your health care provider what activities are safe for you.  Protect your wound from the sun when you are outside for the first 6 months, or for as long as told by your health care provider. Cover up the scar area or apply sunscreen that has an SPF of at least 7.  Do not use any products that contain nicotine or tobacco, such as cigarettes, e-cigarettes, and chewing tobacco. These may delay wound healing. If you need help quitting, ask your health care provider.  Keep all follow-up visits as told by your health care provider. This is important. Contact a health care provider if:  You received a tetanus shot and you have swelling, severe pain, redness, or bleeding at the injection site.  Your pain is not controlled with medicine.  You have any of these signs of infection: ? More redness, swelling, or pain around the wound. ? Fluid or blood coming from the wound. ? Warmth coming from the wound. ? Pus or a bad smell coming from the wound. ? A fever or chills.  You are nauseous or you vomit.  You are dizzy. Get help right away if:  You have a red streak of skin near the area around your wound.  Your  wound has been closed with staples, sutures, skin glue, or adhesive strips and it begins to open up and separate.  Your wound is bleeding, and the bleeding does not stop with gentle pressure.  You have a rash.  You faint.  You have trouble breathing. These symptoms may represent a serious problem that is an emergency. Do not wait to see if the symptoms will go away. Get medical help right away. Call your local emergency services (911 in the [place] Do not drive yourself to the hospital. Summary  Always wash your hands with soap and water for at least 20 seconds  before and after changing your dressing.  Change your dressing as told by your health care provider.  To help with healing, eat foods that are rich in protein, vitamin A, vitamin C, and other nutrients.  Check your wound every day for signs of infection. Contact your health care provider if you suspect that your wound is infected. This information is not intended to replace advice given to you by your health care provider. Make sure you discuss any questions you have with your health care provider. Document Revised: 11/20/2018 Document Reviewed: 11/20/2018 Elsevier Patient Education  2021 [place] [employer], Adult, Care After This sheet gives you information about how to care for yourself after your procedure. Your health care provider may also give you more specific instructions. If you have problems or questions, contact your health care provider. What can I expect after the procedure? After the procedure, the following side effects are common:  Pain or discomfort at the IV site.  Nausea.  Vomiting.  Sore throat.  Trouble concentrating.  Feeling cold or chills.  Feeling weak or tired.  Sleepiness and fatigue.  Soreness and body aches. These side effects can affect parts of the body that were not involved in surgery. Follow these instructions at home: For the time period you were told by your health care provider:  Rest.  Do not participate in activities where you could fall or become injured.  Do not drive or use machinery.  Do not drink alcohol.  Do not take sleeping pills or medicines that cause drowsiness.  Do not make important decisions or sign legal documents.  Do not take care of children on your own.   Eating and drinking  Follow any instructions from your health care provider about eating or drinking restrictions.  When you feel hungry, start by eating small amounts of foods that are soft and easy to digest (bland), such as toast.  Gradually return to your regular diet.  Drink enough fluid to keep your urine pale yellow.  If you vomit, rehydrate by drinking water, juice, or clear broth. General instructions  If you have sleep apnea, surgery and certain medicines can increase your risk for breathing problems. Follow instructions from your health care provider about wearing your sleep device: ? Anytime you are sleeping, including during daytime naps. ? While taking prescription pain medicines, sleeping medicines, or medicines that make you drowsy.  Have a responsible adult stay with you for the time you are told. It is important to have someone help care for you until you are awake and alert.  Return to your normal activities as told by your health care provider. Ask your health care provider what activities are safe for you.  Take over-the-counter and prescription medicines only as told by your health care provider.  If you smoke, do not smoke without supervision.  Keep all follow-up visits as  told by your health care provider. This is important. Contact a health care provider if:  You have nausea or vomiting that does not get better with medicine.  You cannot eat or drink without vomiting.  You have pain that does not get better with medicine.  You are unable to pass urine.  You develop a skin rash.  You have a fever.  You have redness around your IV site that gets worse. Get help right away if:  You have difficulty breathing.  You have chest pain.  You have blood in your urine or stool, or you vomit blood. Summary  After the procedure, it is common to have a sore throat or nausea. It is also common to feel tired.  Have a responsible adult stay with you for the time you are told. It is important to have someone help care for you until you are awake and alert.  When you feel hungry, start by eating small amounts of foods that are soft and easy to digest (bland), such as toast. Gradually return to  your regular diet.  Drink enough fluid to keep your urine pale yellow.  Return to your normal activities as told by your health care provider. Ask your health care provider what activities are safe for you. This information is not intended to replace advice given to you by your health care provider. Make sure you discuss any questions you have with your health care provider. Document Revised: 10/21/2019 Document Reviewed: 05/20/2019 Elsevier Patient Education  2021 [employer].

## 2020-05-03 ENCOUNTER — Other Ambulatory Visit (HOSPITAL_COMMUNITY)
Admission: RE | Admit: 2020-05-03 | Discharge: 2020-05-03 | Disposition: A | Discharge status: Home / Self Care | Payer: Medicaid Other | Source: Ambulatory Visit | Attending: General Surgery | Admitting: General Surgery

## 2020-05-03 ENCOUNTER — Encounter (HOSPITAL_COMMUNITY): Payer: Self-pay

## 2020-05-03 ENCOUNTER — Encounter (HOSPITAL_COMMUNITY)
Admission: RE | Admit: 2020-05-03 | Discharge: 2020-05-03 | Disposition: A | Discharge status: Home / Self Care | Payer: Medicaid Other | Source: Ambulatory Visit | Attending: General Surgery | Admitting: General Surgery

## 2020-05-03 ENCOUNTER — Other Ambulatory Visit: Payer: Self-pay

## 2020-05-03 DIAGNOSIS — Z01812 Encounter for preprocedural laboratory examination: Secondary | ICD-10-CM | POA: Diagnosis present

## 2020-05-03 DIAGNOSIS — Z20822 Contact with and (suspected) exposure to covid-19: Secondary | ICD-10-CM | POA: Insufficient documentation

## 2020-05-03 HISTORY — DX: Other complications of anesthesia, initial encounter: T88.59XA

## 2020-05-03 LAB — BASIC METABOLIC PANEL
Anion gap: 14 (ref 5–15)
BUN: 14 mg/dL (ref 6–20)
CO2: 22 mmol/L (ref 22–32)
Calcium: 9 mg/dL (ref 8.9–10.3)
Chloride: 104 mmol/L (ref 98–111)
Creatinine, Ser: 0.69 mg/dL (ref 0.44–1.00)
GFR, Estimated: >60 mL/min (ref >60)
Glucose, Bld: 106 mg/dL — ABNORMAL HIGH (ref 70–99)
Potassium: 3.4 mmol/L — ABNORMAL LOW (ref 3.5–5.1)
Sodium: 140 mmol/L (ref 135–145)

## 2020-05-03 LAB — SARS CORONAVIRUS 2 (TAT 6-24 HRS): SARS Coronavirus 2: NEGATIVE

## 2020-05-05 ENCOUNTER — Ambulatory Visit (HOSPITAL_COMMUNITY)
Admission: RE | Admit: 2020-05-05 | Discharge: 2020-05-05 | Disposition: A | Discharge status: Home / Self Care | Payer: Medicaid Other | Source: Ambulatory Visit | Attending: General Surgery | Admitting: General Surgery

## 2020-05-05 ENCOUNTER — Ambulatory Visit (HOSPITAL_COMMUNITY): Payer: Medicaid Other | Admitting: Anesthesiology

## 2020-05-05 ENCOUNTER — Encounter (HOSPITAL_COMMUNITY)
Admission: RE | Disposition: A | Discharge status: Home / Self Care | Payer: Self-pay | Source: Ambulatory Visit | Attending: General Surgery

## 2020-05-05 DIAGNOSIS — Q231 Congenital insufficiency of aortic valve: Secondary | ICD-10-CM | POA: Insufficient documentation

## 2020-05-05 DIAGNOSIS — M5430 Sciatica, unspecified side: Secondary | ICD-10-CM | POA: Diagnosis not present

## 2020-05-05 DIAGNOSIS — Z79890 Hormone replacement therapy (postmenopausal): Secondary | ICD-10-CM | POA: Insufficient documentation

## 2020-05-05 DIAGNOSIS — Z823 Family history of stroke: Secondary | ICD-10-CM | POA: Insufficient documentation

## 2020-05-05 DIAGNOSIS — F17210 Nicotine dependence, cigarettes, uncomplicated: Secondary | ICD-10-CM | POA: Insufficient documentation

## 2020-05-05 DIAGNOSIS — Z885 Allergy status to narcotic agent status: Secondary | ICD-10-CM | POA: Insufficient documentation

## 2020-05-05 DIAGNOSIS — Z8249 Family history of ischemic heart disease and other diseases of the circulatory system: Secondary | ICD-10-CM | POA: Insufficient documentation

## 2020-05-05 DIAGNOSIS — Z881 Allergy status to other antibiotic agents status: Secondary | ICD-10-CM | POA: Diagnosis not present

## 2020-05-05 DIAGNOSIS — Z79899 Other long term (current) drug therapy: Secondary | ICD-10-CM | POA: Diagnosis not present

## 2020-05-05 DIAGNOSIS — D1723 Benign lipomatous neoplasm of skin and subcutaneous tissue of right leg: Secondary | ICD-10-CM | POA: Diagnosis present

## 2020-05-05 HISTORY — PX: LIPOMA EXCISION: SHX5283

## 2020-05-05 SURGERY — EXCISION LIPOMA
Anesthesia: General | Laterality: Right

## 2020-05-05 MED ORDER — MIDAZOLAM HCL 2 MG/2ML IJ SOLN
INTRAMUSCULAR | Status: AC
  Filled 2020-05-05: qty 2

## 2020-05-05 MED ORDER — CEFAZOLIN SODIUM-DEXTROSE 2-4 GM/100ML-% IV SOLN
2.0000 g | INTRAVENOUS | Status: AC
  Administered 2020-05-05: 2 g via INTRAVENOUS
  Filled 2020-05-05: qty 100

## 2020-05-05 MED ORDER — EPHEDRINE 5 MG/ML INJ
INTRAVENOUS | Status: AC
  Filled 2020-05-05: qty 10

## 2020-05-05 MED ORDER — PROPOFOL 10 MG/ML IV BOLUS
INTRAVENOUS | Status: AC
  Filled 2020-05-05: qty 40

## 2020-05-05 MED ORDER — ONDANSETRON HCL 4 MG PO TABS: 4.0000 mg | ORAL_TABLET | Freq: Three times a day (TID) | ORAL | 1 refills | Status: DC | PRN

## 2020-05-05 MED ORDER — ORAL CARE MOUTH RINSE: 15.0000 mL | Freq: Once | OROMUCOSAL | Status: DC

## 2020-05-05 MED ORDER — SEVOFLURANE IN SOLN
RESPIRATORY_TRACT | Status: AC
  Filled 2020-05-05: qty 250

## 2020-05-05 MED ORDER — DEXAMETHASONE SODIUM PHOSPHATE 10 MG/ML IJ SOLN
INTRAMUSCULAR | Status: DC | PRN
  Administered 2020-05-05: 8 mg via INTRAVENOUS

## 2020-05-05 MED ORDER — ONDANSETRON HCL 4 MG/2ML IJ SOLN: 4.0000 mg | Freq: Once | INTRAMUSCULAR | Status: DC | PRN

## 2020-05-05 MED ORDER — ORAL CARE MOUTH RINSE: 15.0000 mL | Freq: Once | OROMUCOSAL | Status: AC

## 2020-05-05 MED ORDER — MIDAZOLAM HCL 5 MG/5ML IJ SOLN
INTRAMUSCULAR | Status: DC | PRN
  Administered 2020-05-05: 2 mg via INTRAVENOUS

## 2020-05-05 MED ORDER — PROPOFOL 10 MG/ML IV BOLUS
INTRAVENOUS | Status: DC | PRN
  Administered 2020-05-05: 150 mg via INTRAVENOUS

## 2020-05-05 MED ORDER — CHLORHEXIDINE GLUCONATE 0.12 % MT SOLN: 15.0000 mL | Freq: Once | OROMUCOSAL | Status: DC

## 2020-05-05 MED ORDER — CHLORHEXIDINE GLUCONATE CLOTH 2 % EX PADS: 6.0000 | MEDICATED_PAD | Freq: Once | CUTANEOUS | Status: DC

## 2020-05-05 MED ORDER — LIDOCAINE HCL (PF) 2 % IJ SOLN
INTRAMUSCULAR | Status: AC
  Filled 2020-05-05: qty 5

## 2020-05-05 MED ORDER — DEXAMETHASONE SODIUM PHOSPHATE 10 MG/ML IJ SOLN
INTRAMUSCULAR | Status: AC
  Filled 2020-05-05: qty 1

## 2020-05-05 MED ORDER — FENTANYL CITRATE (PF) 100 MCG/2ML IJ SOLN: 25.0000 ug | INTRAMUSCULAR | Status: DC | PRN

## 2020-05-05 MED ORDER — OXYCODONE HCL 5 MG PO TABS: 5.0000 mg | ORAL_TABLET | ORAL | 0 refills | Status: DC | PRN

## 2020-05-05 MED ORDER — SODIUM CHLORIDE 0.9 % IR SOLN
Status: DC | PRN
  Administered 2020-05-05: 1000 mL

## 2020-05-05 MED ORDER — FENTANYL CITRATE (PF) 100 MCG/2ML IJ SOLN
INTRAMUSCULAR | Status: DC | PRN
  Administered 2020-05-05 (×3): 50 ug via INTRAVENOUS

## 2020-05-05 MED ORDER — ONDANSETRON HCL 4 MG/2ML IJ SOLN
INTRAMUSCULAR | Status: AC
  Filled 2020-05-05: qty 2

## 2020-05-05 MED ORDER — LACTATED RINGERS IV SOLN: INTRAVENOUS | Status: DC

## 2020-05-05 MED ORDER — EPHEDRINE SULFATE 50 MG/ML IJ SOLN
INTRAMUSCULAR | Status: DC | PRN
  Administered 2020-05-05 (×2): 10 mg via INTRAVENOUS

## 2020-05-05 MED ORDER — LIDOCAINE HCL (CARDIAC) PF 100 MG/5ML IV SOSY
PREFILLED_SYRINGE | INTRAVENOUS | Status: DC | PRN
  Administered 2020-05-05: 80 mg via INTRATRACHEAL

## 2020-05-05 MED ORDER — GLYCOPYRROLATE PF 0.2 MG/ML IJ SOSY
PREFILLED_SYRINGE | INTRAMUSCULAR | Status: DC | PRN
  Administered 2020-05-05: .2 mg via INTRAVENOUS

## 2020-05-05 MED ORDER — FENTANYL CITRATE (PF) 250 MCG/5ML IJ SOLN
INTRAMUSCULAR | Status: AC
  Filled 2020-05-05: qty 5

## 2020-05-05 MED ORDER — BUPIVACAINE HCL (PF) 0.5 % IJ SOLN
INTRAMUSCULAR | Status: AC
  Filled 2020-05-05: qty 30

## 2020-05-05 MED ORDER — LIDOCAINE HCL (PF) 1 % IJ SOLN
INTRAMUSCULAR | Status: AC
  Filled 2020-05-05: qty 30

## 2020-05-05 MED ORDER — ONDANSETRON HCL 4 MG/2ML IJ SOLN
INTRAMUSCULAR | Status: DC | PRN
  Administered 2020-05-05: 4 mg via INTRAVENOUS

## 2020-05-05 MED ORDER — CHLORHEXIDINE GLUCONATE 0.12 % MT SOLN
15.0000 mL | Freq: Once | OROMUCOSAL | Status: AC
  Administered 2020-05-05: 15 mL via OROMUCOSAL
  Filled 2020-05-05: qty 15

## 2020-05-05 MED ORDER — LIDOCAINE HCL 1 % IJ SOLN
INTRAMUSCULAR | Status: DC | PRN
  Administered 2020-05-05: 10 mL via INTRAMUSCULAR

## 2020-05-05 MED ORDER — GLYCOPYRROLATE PF 0.2 MG/ML IJ SOSY
PREFILLED_SYRINGE | INTRAMUSCULAR | Status: AC
  Filled 2020-05-05: qty 1

## 2020-05-05 SURGICAL SUPPLY — 36 items
APL PRP STRL LF DISP 70% ISPRP (MISCELLANEOUS) ×1
BANDAGE ELASTIC 6 VELCRO NS (GAUZE/BANDAGES/DRESSINGS) ×1 IMPLANT
CHLORAPREP W/TINT 26 (MISCELLANEOUS) ×2 IMPLANT
CLOTH BEACON ORANGE TIMEOUT ST (SAFETY) ×2 IMPLANT
COVER LIGHT HANDLE STERIS (MISCELLANEOUS) ×4 IMPLANT
COVER WAND RF STERILE (DRAPES) ×2 IMPLANT
DRSG TEGADERM 4X4.75 (GAUZE/BANDAGES/DRESSINGS) ×2 IMPLANT
ELECT NDL TIP 2.8 STRL (NEEDLE) IMPLANT
ELECT NEEDLE TIP 2.8 STRL (NEEDLE) IMPLANT
ELECT REM PT RETURN 9FT ADLT (ELECTROSURGICAL) ×2
ELECTRODE REM PT RTRN 9FT ADLT (ELECTROSURGICAL) ×1 IMPLANT
GAUZE SPONGE 2X2 8PLY STRL LF (GAUZE/BANDAGES/DRESSINGS) ×2 IMPLANT
GAUZE SPONGE 4X4 12PLY STRL (GAUZE/BANDAGES/DRESSINGS) ×1 IMPLANT
GLOVE EUDERMIC 6.5 POWDERFREE (GLOVE) ×1 IMPLANT
GLOVE SS BIOGEL STRL SZ 6.5 (GLOVE) IMPLANT
GLOVE SUPERSENSE BIOGEL SZ 6.5 (GLOVE) ×2
GLOVE SURG ENC MOIS LTX SZ6.5 (GLOVE) ×2 IMPLANT
GLOVE SURG UNDER POLY LF SZ6.5 (GLOVE) ×2 IMPLANT
GLOVE SURG UNDER POLY LF SZ7 (GLOVE) ×2 IMPLANT
GOWN STRL REUS W/TWL LRG LVL3 (GOWN DISPOSABLE) ×4 IMPLANT
KIT TURNOVER KIT A (KITS) ×2 IMPLANT
MANIFOLD NEPTUNE II (INSTRUMENTS) ×2 IMPLANT
NDL HYPO 25X1 1.5 SAFETY (NEEDLE) ×1 IMPLANT
NEEDLE HYPO 25X1 1.5 SAFETY (NEEDLE) ×2 IMPLANT
NS IRRIG 1000ML POUR BTL (IV SOLUTION) ×2 IMPLANT
PACK MINOR (CUSTOM PROCEDURE TRAY) ×2 IMPLANT
PAD ABD 5X9 TENDERSORB (GAUZE/BANDAGES/DRESSINGS) ×1 IMPLANT
PAD ARMBOARD 7.5X6 YLW CONV (MISCELLANEOUS) ×2 IMPLANT
SET BASIN LINEN APH (SET/KITS/TRAYS/PACK) ×2 IMPLANT
SPONGE GAUZE 2X2 STER 10/PKG (GAUZE/BANDAGES/DRESSINGS) ×2
SUT ETHILON 3 0 FSL (SUTURE) IMPLANT
SUT MNCRL AB 4-0 PS2 18 (SUTURE) ×1 IMPLANT
SUT VIC AB 3-0 SH 27 (SUTURE) ×4
SUT VIC AB 3-0 SH 27X BRD (SUTURE) ×1 IMPLANT
SYR BULB IRRIG 60ML STRL (SYRINGE) ×2 IMPLANT
SYR CONTROL 10ML LL (SYRINGE) ×2 IMPLANT

## 2020-05-05 NOTE — Discharge Instructions (Signed)
Discharge Instructions:  Common Complaints: Pain at the incision site is common.  Some nausea is common and poor appetite are common after anesthesia. The main goal is to stay hydrated the first few days after surgery.   Diet/ Activity: Diet as tolerated. You may not have an appetite, but it is important to stay hydrated. Drink 64 ounces of water a day. Your appetite will return with time.  Shower per your regular routine daily.  Do not take hot showers. Take warm showers that are less than 10 minutes. Walk everyday for at least 15-20 minutes. Deep cough and move around every 1-2 hours in the first few days after surgery.  Limit excessive movement, lifting > 10 lbs, stretching with the limb if there is an incision on your arm/armpit or leg.   Limit stretching, pulling on your incision if it is located on other parts of your body.  Do not pick at the dermabond glue on your incision sites. This glue film will remain in place for 1-2 weeks and will start to peel off. Keep your leg wrapped with an ACE wrap to help place pressure on the area to try to help a seroma (fluid pocket) from forming.   Do not place lotions or balms on your incision unless instructed to specifically by Dr. Constance Haw.   Medication: Take tylenol and ibuprofen as needed for pain control, alternating every 4-6 hours.  Example:  Tylenol 1000mg  @ 6am, 12noon, 6pm, 62midnight (Do not exceed 4000mg  of tylenol a day). Ibuprofen 800mg  @ 9am, 3pm, 9pm, 3am (Do not exceed 3600mg  of ibuprofen a day).  Take Roxicodone for breakthrough pain every 4 hours.  Take Colace for constipation related to narcotic pain medication. If you do not have a bowel movement in 2 days, take Miralax over the counter.  Drink plenty of water to also prevent constipation.   Contact Information: If you have questions or concerns, please call our office, [phone], Monday- Thursday 8AM-5PM and Friday 8AM-12Noon.  If it is after hours or on the weekend,  please call [facility], [phone], [phone], and ask to speak to the surgeon on call for Dr. Constance Haw at [facility].     Lipoma Removal, Care After This sheet gives you information about how to care for yourself after your procedure. Your health care provider may also give you more specific instructions. If you have problems or questions, contact your health care provider. What can I expect after the procedure? After the procedure, it is common to have:  Mild pain.  Swelling.  Bruising. Follow these instructions at home: Bathing  Do not take baths, swim, or use a hot tub until your health care provider approves. You may shower.   Keep your bandage (dressing) dry until your health care provider says it can be removed.   Incision care  Follow instructions from your health care provider about how to take care of your incision. Make sure you: ? Wash your hands with soap and water for at least 20 seconds before and after you change your dressing. If soap and water are not available, use hand sanitizer. ? Change your dressing as told by your health care provider. ? Leave stitches (sutures), skin glue, or adhesive strips in place. These skin closures may need to stay in place for 2 weeks or longer. If adhesive strip edges start to loosen and curl up, you may trim the loose edges. Do not remove adhesive strips completely unless your health care provider tells you to  do that.  Check your incision area every day for signs of infection. Check for: ? More redness, swelling, or pain. ? Fluid or blood. ? Warmth. ? Pus or a bad smell.   Medicines  Take over-the-counter and prescription medicines only as told by your health care provider.  If you were prescribed an antibiotic medicine, use it as told by your health care provider. Do not stop using the antibiotic even if you start to feel better. General instructions  If you were given a sedative during the procedure, it can  affect you for several hours. Do not drive or operate machinery until your health care provider says that it is safe.  Do not use any products that contain nicotine or tobacco, such as cigarettes, e-cigarettes, and chewing tobacco. These can delay healing. If you need help quitting, ask your health care provider.  Return to your normal activities as told by your health care provider. Ask your health care provider what activities are safe for you.  Keep all follow-up visits as told by your health care provider. This is important.   Contact a health care provider if:  You have more redness, swelling, or pain around your incision.  You have fluid or blood coming from your incision.  Your incision feels warm to the touch.  You have pus or a bad smell coming from your incision.  You have pain that does not get better with medicine. Get help right away if:  You have chills or a fever.  You have severe pain. Summary  After the procedure, it is common to have mild pain, swelling, and bruising.  Follow instructions from your health care provider about how to take care of your incision.  Check your incision area every day for signs of infection.  Contact a health care provider if you have more redness, swelling, or pain around your incision. This information is not intended to replace advice given to you by your health care provider. Make sure you discuss any questions you have with your health care provider. Document Revised: 09/21/2018 Document Reviewed: 09/21/2018 Elsevier Patient Education  2021 [place] Anesthesia, Adult, Care After This sheet gives you information about how to care for yourself after your procedure. Your health care provider may also give you more specific instructions. If you have problems or questions, contact your health care provider. What can I expect after the procedure? After the procedure, the following side effects are common:  Pain  or discomfort at the IV site.  Nausea.  Vomiting.  Sore throat.  Trouble concentrating.  Feeling cold or chills.  Feeling weak or tired.  Sleepiness and fatigue.  Soreness and body aches. These side effects can affect parts of the body that were not involved in surgery. Follow these instructions at home: For the time period you were told by your health care provider:  Rest.  Do not participate in activities where you could fall or become injured.  Do not drive or use machinery.  Do not drink alcohol.  Do not take sleeping pills or medicines that cause drowsiness.  Do not make important decisions or sign legal documents.  Do not take care of children on your own.   Eating and drinking  Follow any instructions from your health care provider about eating or drinking restrictions.  When you feel hungry, start by eating small amounts of foods that are soft and easy to digest (bland), such as toast. Gradually return to your  regular diet.  Drink enough fluid to keep your urine pale yellow.  If you vomit, rehydrate by drinking water, juice, or clear broth. General instructions  If you have sleep apnea, surgery and certain medicines can increase your risk for breathing problems. Follow instructions from your health care provider about wearing your sleep device: ? Anytime you are sleeping, including during daytime naps. ? While taking prescription pain medicines, sleeping medicines, or medicines that make you drowsy.  Have a responsible adult stay with you for the time you are told. It is important to have someone help care for you until you are awake and alert.  Return to your normal activities as told by your health care provider. Ask your health care provider what activities are safe for you.  Take over-the-counter and prescription medicines only as told by your health care provider.  If you smoke, do not smoke without supervision.  Keep all follow-up visits as told  by your health care provider. This is important. Contact a health care provider if:  You have nausea or vomiting that does not get better with medicine.  You cannot eat or drink without vomiting.  You have pain that does not get better with medicine.  You are unable to pass urine.  You develop a skin rash.  You have a fever.  You have redness around your IV site that gets worse. Get help right away if:  You have difficulty breathing.  You have chest pain.  You have blood in your urine or stool, or you vomit blood. Summary  After the procedure, it is common to have a sore throat or nausea. It is also common to feel tired.  Have a responsible adult stay with you for the time you are told. It is important to have someone help care for you until you are awake and alert.  When you feel hungry, start by eating small amounts of foods that are soft and easy to digest (bland), such as toast. Gradually return to your regular diet.  Drink enough fluid to keep your urine pale yellow.  Return to your normal activities as told by your health care provider. Ask your health care provider what activities are safe for you. This information is not intended to replace advice given to you by your health care provider. Make sure you discuss any questions you have with your health care provider. Document Revised: 10/21/2019 Document Reviewed: 05/20/2019 Elsevier Patient Education  2021 [place].     Oxycodone Capsules or Tablets What is this medicine? OXYCODONE (ox i KOE done) is a pain reliever, also called an opioid. It treats severe pain. This medicine may be used for other purposes; ask your health care provider or pharmacist if you have questions. COMMON BRAND NAME(S): Dazidox, Endocodone, Oxaydo, OXECTA, OxyIR, Percolone, Roxicodone, Roxybond What should I tell my health care provider before I take this medicine? They need to know if you have any of these conditions:  brain  tumor  drug abuse or addiction  head injury  heart disease  if you often drink alcohol  kidney disease  liver disease  low adrenal gland function  lung disease, asthma, or breathing problem  seizures  stomach or intestine problems  taken an MAOI such as Marplan, Nardil, or Parnate in the last 14 days  an unusual or allergic reaction to oxycodone, other drugs, foods, dyes, or preservatives  pregnant or trying to get pregnant  breast-feeding How should I use this medicine? Take this medicine by  mouth with water. Take it as directed on the prescription label at the same time every day. You can take it with or without food. If it upsets your stomach, take it with food. Keep taking it unless your health care provider tells you to stop. Some brands of this medicine, like Oxaydo, have special instructions. Ask your doctor or pharmacist if these directions are for you: Do not cut, crush or chew this medicine. Do not wet, soak, or lick the tablet before you take it. A special MedGuide will be given to you by the pharmacist with each prescription and refill. Be sure to read this information carefully each time. Talk to your pediatrician regarding the use of this medicine in children. Special care may be needed. Overdosage: If you think you have taken too much of this medicine contact a poison control center or emergency room at once. NOTE: This medicine is only for you. Do not share this medicine with others. What if I miss a dose? If you miss a dose, take it as soon as you can. If it is almost time for your next dose, take only that dose. Do not take double or extra doses. What may interact with this medicine? Do not take this medicine with any of the following medications:  safinamide This medicine may interact with the following medications:  alcohol  antihistamines for allergy, cough, and cold  atropine  certain antivirals for HIV or hepatitis  certain antibiotics like  clarithromycin, erythromycin, linezolid, rifampin  certain medicines for anxiety or sleep  certain medicines for bladder problems like oxybutynin, tolterodine  certain medicines for depression like amitriptyline, fluoxetine, sertraline  certain medicines for fungal infections like ketoconazole, itraconazole, posaconazole  certain medicines for migraine headache like almotriptan, eletriptan, frovatriptan, naratriptan, rizatriptan, sumatriptan, zolmitriptan  certain medicines for nausea or vomiting like dolasetron, granisetron, ondansetron, palonosetron  certain medicines for Parkinson's disease like benztropine, trihexyphenidyl  certain medicines for seizures like carbamazepine, phenobarbital, phenytoin, primidone  certain medicines for stomach problems like dicyclomine, hyoscyamine  certain medicines for travel sickness like scopolamine  diuretics  general anesthetics like halothane, isoflurane, methoxyflurane, propofol  ipratropium  MAOIs like Marplan, Nardil, and Parnate  medicines that relax muscles  methylene blue  other narcotic medicines for pain or cough  phenothiazines like chlorpromazine, mesoridazine, prochlorperazine, thioridazine This list may not describe all possible interactions. Give your health care provider a list of all the medicines, herbs, non-prescription drugs, or dietary supplements you use. Also tell them if you smoke, drink alcohol, or use illegal drugs. Some items may interact with your medicine. What should I watch for while using this medicine? Tell your health care provider if your pain does not go away, if it gets worse, or if you have new or a different type of pain. You may develop tolerance to this medicine. Tolerance means that you will need a higher dose of the medicine for pain relief. Tolerance is normal and is expected if you take this medicine for a long time. Do not suddenly stop taking your medicine because you may develop a severe  reaction. Your body becomes used to the medicine. This does NOT mean you are addicted. Addiction is a behavior related to getting and using a medicine for a nonmedical reason. If you have pain, you have a medical reason to take pain medicine. Your health care provider will tell you how much medicine to take. If your health care provider wants you to stop the medicine, the dose will be slowly  lowered over time to avoid any side effects. If you take other medicines that also cause drowsiness such as other narcotic pain medicines, benzodiazepines, or other medicines for sleep, you may have more side effects. Give your health care provider a list of all medicines you use. He or she will tell you how much medicine to take. Do not take more medicine than directed. Get emergency help right away if you have trouble breathing or are unusually tired or sleepy. Talk to your health care provider about naloxone and how to get it. Naloxone is an emergency medicine used for an opioid overdose. An overdose can happen if you take too much opioid. It can also happen if an opioid is taken with some other medicines or substances, such as alcohol. Know the symptoms of an overdose, such as trouble breathing, unusually tired or sleepy, or not being able to respond or wake up. Make sure to tell caregivers and close contacts where it is stored. Make sure they know how to use it. After naloxone is given, you must get emergency help right away. Naloxone is a temporary treatment. Repeat doses may be needed. You may get drowsy or dizzy. Do not drive, use machinery, or do anything that needs mental alertness until you know how this medicine affects you. Do not stand up or sit up quickly, especially if you are an older patient. This reduces the risk of dizzy or fainting spells. Alcohol may interfere with the effect of this medicine. Avoid alcoholic drinks. This medicine will cause constipation. If you do not have a bowel movement for 3 days,  call your health care provider. Your mouth may get dry. Chewing sugarless gum or sucking hard candy and drinking plenty of water may help. Contact your health care provider if the problem does not go away or is severe. What side effects may I notice from receiving this medicine? Side effects that you should report to your doctor or health care professional as soon as possible:  allergic reactions (skin rash, itching or hives; swelling of the face, lips, or tongue)  confusion  kidney injury (trouble passing urine or change in the amount of urine)  low adrenal gland function (nausea; vomiting; loss of appetite; unusually weak or tired; dizziness; low blood pressure)  low blood pressure (dizziness; feeling faint or lightheaded, falls; unusually weak or tired)  serotonin syndrome (irritable; confusion; diarrhea; fast or irregular heartbeat; muscle twitching; stiff muscles; trouble walking; sweating; high fever; seizures; chills; vomiting)  trouble breathing Side effects that usually do not require medical attention (report to your doctor or health care professional if they continue or are bothersome):  constipation  dry mouth  nausea, vomiting  tiredness This list may not describe all possible side effects. Call your doctor for medical advice about side effects. You may report side effects to FDA at 1-800-FDA-1088. Where should I keep my medicine? Keep out of the reach of children and pets. This medicine can be abused. Keep it in a safe place to protect it from theft. Do not share it with anyone. It is only for you. Selling or giving away this medicine is dangerous and against the law. Store at [employer] C (77 degrees F). Protect from light and moisture. Keep the container tightly closed. Get rid of any unused medicine after the expiration date. This medicine may cause harm and death if it is taken by other adults, children, or pets. It is important to get rid of the medicine as soon as  you no longer need it or it is expired. You can do this in two ways:  Take the medicine to a medicine take-back program. Check with your pharmacy or law enforcement to find a location.  If you cannot return the medicine, flush it down the toilet. NOTE: This sheet is a summary. It may not cover all possible information. If you have questions about this medicine, talk to your doctor, pharmacist, or health care provider.  2021 Elsevier/Gold Standard (2019-12-27 13:26:06)    Ondansetron oral dissolving tablet What is this medicine? ONDANSETRON (on DAN se tron) is used to treat nausea and vomiting caused by chemotherapy. It is also used to prevent or treat nausea and vomiting after surgery. This medicine may be used for other purposes; ask your health care provider or pharmacist if you have questions. COMMON BRAND NAME(S): Zofran ODT What should I tell my health care provider before I take this medicine? They need to know if you have any of these conditions:  heart disease  history of irregular heartbeat  liver disease  low levels of magnesium or potassium in the blood  an unusual or allergic reaction to ondansetron, granisetron, other medicines, foods, dyes, or preservatives  pregnant or trying to get pregnant  breast-feeding How should I use this medicine? These tablets are made to dissolve in the mouth. Do not try to push the tablet through the foil backing. With dry hands, peel away the foil backing and gently remove the tablet. Place the tablet in the mouth and allow it to dissolve, then swallow. While you may take these tablets with water, it is not necessary to do so. Talk to your pediatrician regarding the use of this medicine in children. Special care may be needed. Overdosage: If you think you have taken too much of this medicine contact a poison control center or emergency room at once. NOTE: This medicine is only for you. Do not share this medicine with others. What if I  miss a dose? If you miss a dose, take it as soon as you can. If it is almost time for your next dose, take only that dose. Do not take double or extra doses. What may interact with this medicine? Do not take this medicine with any of the following medications:  apomorphine  certain medicines for fungal infections like fluconazole, itraconazole, ketoconazole, posaconazole, voriconazole  cisapride  dronedarone  pimozide  thioridazine This medicine may also interact with the following medications:  carbamazepine  certain medicines for depression, anxiety, or psychotic disturbances  fentanyl  linezolid  MAOIs like Carbex, Eldepryl, Marplan, Nardil, and Parnate  methylene blue (injected into a vein)  other medicines that prolong the QT interval (cause an abnormal heart rhythm) like dofetilide, ziprasidone  phenytoin  rifampicin  tramadol This list may not describe all possible interactions. Give your health care provider a list of all the medicines, herbs, non-prescription drugs, or dietary supplements you use. Also tell them if you smoke, drink alcohol, or use illegal drugs. Some items may interact with your medicine. What should I watch for while using this medicine? Check with your doctor or health care professional as soon as you can if you have any sign of an allergic reaction. What side effects may I notice from receiving this medicine? Side effects that you should report to your doctor or health care professional as soon as possible:  allergic reactions like skin rash, itching or hives, swelling of the face, lips, or tongue  breathing problems  confusion  dizziness  fast or irregular heartbeat  feeling faint or lightheaded, falls  fever and chills  loss of balance or coordination  seizures  sweating  swelling of the hands and feet  tightness in the chest  tremors  unusually weak or tired Side effects that usually do not require medical attention  (report to your doctor or health care professional if they continue or are bothersome):  constipation or diarrhea  headache This list may not describe all possible side effects. Call your doctor for medical advice about side effects. You may report side effects to FDA at 1-800-FDA-1088. Where should I keep my medicine? Keep out of the reach of children. Store between 2 and 30 degrees C (36 and 86 degrees F). Throw away any unused medicine after the expiration date. NOTE: This sheet is a summary. It may not cover all possible information. If you have questions about this medicine, talk to your doctor, pharmacist, or health care provider.  2021 Elsevier/Gold Standard (2018-01-27 07:14:10)

## 2020-05-05 NOTE — Transfer of Care (Signed)
Immediate Anesthesia Transfer of Care Note  Patient: Kara Matthews  Procedure(s) Performed: EXCISION LIPOMA, THIGH, 6CM (Right )  Patient Location: PACU  Anesthesia Type:General  Level of Consciousness: oriented, drowsy and patient cooperative  Airway & Oxygen Therapy: Patient Spontanous Breathing and Patient connected to nasal cannula oxygen  Post-op Assessment: Report given to RN and Post -op Vital signs reviewed and stable  Post vital signs: Reviewed and stable  Last Vitals:  Vitals Value Taken Time  BP 118/87 05/05/20 0825  Temp    Pulse 88 05/05/20 0828  Resp 18 05/05/20 0828  SpO2 93 % 05/05/20 0828  Vitals shown include unvalidated device data.  Last Pain:  Vitals:   05/05/20 0711  TempSrc: Oral  PainSc: 0-No pain      Patients Stated Pain Goal: 5 (93/26/71 2458)  Complications: No complications documented.

## 2020-05-05 NOTE — Anesthesia Preprocedure Evaluation (Signed)
Anesthesia Evaluation  Patient identified by MRN, date of birth, ID band Patient awake    Reviewed: Allergy & Precautions, NPO status , Patient's Chart, lab work & pertinent test results  History of Anesthesia Complications (+) history of anesthetic complications (tachycardia and burning senstion with preop medication?)  Airway Mallampati: II  TM Distance: >3 FB Neck ROM: Full    Dental  (+) Dental Advisory Given, Teeth Intact   Pulmonary Current SmokerPatient did not abstain from smoking.,    Pulmonary exam normal breath sounds clear to auscultation       Cardiovascular Exercise Tolerance: Good hypertension, Pt. on medications Normal cardiovascular exam+ Valvular Problems/Murmurs AS  Rhythm:Regular Rate:Normal + Systolic murmurs    Neuro/Psych PSYCHIATRIC DISORDERS Anxiety  Neuromuscular disease    GI/Hepatic negative GI ROS, Neg liver ROS,   Endo/Other  negative endocrine ROS  Renal/GU negative Renal ROS     Musculoskeletal  (+) Fibromyalgia -, narcotic dependent  Abdominal   Peds  Hematology negative hematology ROS (+)   Anesthesia Other Findings   Reproductive/Obstetrics                            Anesthesia Physical Anesthesia Plan  ASA: II  Anesthesia Plan: General   Post-op Pain Management:    Induction: Intravenous  PONV Risk Score and Plan: 3 and Ondansetron, Dexamethasone and Midazolam  Airway Management Planned: LMA  Additional Equipment:   Intra-op Plan:   Post-operative Plan: Extubation in OR  Informed Consent: I have reviewed the patients History and Physical, chart, labs and discussed the procedure including the risks, benefits and alternatives for the proposed anesthesia with the patient or authorized representative who has indicated his/her understanding and acceptance.     Dental advisory given  Plan Discussed with: CRNA and Surgeon  Anesthesia Plan  Comments:         Anesthesia Quick Evaluation

## 2020-05-05 NOTE — Anesthesia Procedure Notes (Signed)
Procedure Name: LMA Insertion Date/Time: 05/05/2020 7:40 AM Performed by: Gayland Curry, CRNA Pre-anesthesia Checklist: Patient identified, Emergency Drugs available, Suction available and Patient being monitored Patient Re-evaluated:Patient Re-evaluated prior to induction Oxygen Delivery Method: Circle system utilized Preoxygenation: Pre-oxygenation with 100% oxygen Induction Type: IV induction Ventilation: Mask ventilation without difficulty LMA: LMA inserted LMA Size: 3.0 Number of attempts: 1 Placement Confirmation: positive ETCO2 and breath sounds checked- equal and bilateral Dental Injury: Teeth and Oropharynx as per pre-operative assessment

## 2020-05-05 NOTE — Anesthesia Postprocedure Evaluation (Signed)
Anesthesia Post Note  Patient: Kara Matthews  Procedure(s) Performed: EXCISION LIPOMA, THIGH, 6CM (Right )  Patient location during evaluation: PACU Anesthesia Type: General Level of consciousness: awake and alert and oriented Pain management: pain level controlled Vital Signs Assessment: post-procedure vital signs reviewed and stable Respiratory status: spontaneous breathing and respiratory function stable Cardiovascular status: blood pressure returned to baseline and stable Postop Assessment: no apparent nausea or vomiting Anesthetic complications: no   No complications documented.   Last Vitals:  Vitals:   05/05/20 0900 05/05/20 0912  BP: 129/82 129/76  Pulse: (!) 107 78  Resp: 16 18  Temp:  36.7 C  SpO2: 93% 98%    Last Pain:  Vitals:   05/05/20 0912  TempSrc: Oral  PainSc: 7                  Jeliyah Middlebrooks C Fritz Cauthon

## 2020-05-05 NOTE — Progress Notes (Signed)
[facility]  Updated husband surgery completed. Try to wear ace wrap to prevent seroma formation. Will check on in office in a few weeks. Rx sent to [facility].   Curlene Labrum, MD [facility] [address] [place], [place] [address] [phone] (office)

## 2020-05-05 NOTE — Op Note (Signed)
[facility] Operative Note  05/05/20  Preoperative Diagnosis:  Lipoma right thigh    Postoperative Diagnosis: Same   Procedure(s) Performed: Excision of lipoma 6cm right thigh    Surgeon: Lanell Matar. Constance Haw, MD   Assistants: No qualified resident was available    Anesthesia: General endotracheal   Anesthesiologist: Denese Killings, MD    Specimens:  Lipoma    Estimated Blood Loss: Minimal   Blood Replacement: None    Complications: None   Wound Class:  Clean   Operative Indications:  Ms. Guarnieri is a patient with complaints of a lipoma on her right thigh for several years. We discussed excision and risk of bleeding, infection, seroma, needing a drain and recurrence of the area.   Findings: Fatty tissue overlying muscle consistent with lipoma    Procedure: The patient was taken to the operating room and placed supine. General anesthesia was induced. Intravenous antibiotics were administered per protocol.  The right thigh was prepared and draped in the usual sterile fashion.   An incision was made in the longitudinal plane over the area of complaints. This was carried down through the subcutaneous layer of adipose tissue that was even on both sides and flaps were created. A globular area of fatty tissue was just above the muscle and this was consistent with the shape and size of the lipoma felt externally.  A 6cm area of fatty tissue was removed. The deep space/ cavity was closed with interrupted 3-0 Vicryl suture and the skin was closed with 4-0 Monocryl running subcuticular. The skin was closed with dermabond. An ACE wrap was place was used to help prevent seroma formation.    Final inspection revealed acceptable hemostasis. All counts were correct at the end of the case. The patient was awakened from anesthesia without complication.  The patient went to the PACU in stable condition.   Curlene Labrum, MD [facility] [address] [place], [place] [address] [phone] (office)

## 2020-05-05 NOTE — Interval H&P Note (Signed)
History and Physical Interval Note:  05/05/2020 7:25AM  Kara Matthews  has presented today for surgery, with the diagnosis of Lipoma, right thigh, 6cm.  The various methods of treatment have been discussed with the patient and family. After consideration of risks, benefits and other options for treatment, the patient has consented to  Procedure(s): EXCISION LIPOMA, THIGH, 6CM (Right) as a surgical intervention.  The patient's history has been reviewed, patient examined, no change in status, stable for surgery.  I have reviewed the patient's chart and labs.  Questions were answered to the patient's satisfaction.     Virl Cagey

## 2020-05-08 ENCOUNTER — Encounter (HOSPITAL_COMMUNITY): Payer: Self-pay | Admitting: General Surgery

## 2020-05-08 LAB — SURGICAL PATHOLOGY

## 2020-05-16 ENCOUNTER — Encounter: Payer: Self-pay | Admitting: Orthopaedic Surgery

## 2020-05-16 ENCOUNTER — Other Ambulatory Visit: Payer: Self-pay

## 2020-05-16 ENCOUNTER — Ambulatory Visit (INDEPENDENT_AMBULATORY_CARE_PROVIDER_SITE_OTHER): Payer: Medicaid Other | Admitting: Orthopaedic Surgery

## 2020-05-16 VITALS — BP 130/92 | HR 66 | Ht 59.0 in | Wt 175.0 lb

## 2020-05-16 DIAGNOSIS — M7701 Medial epicondylitis, right elbow: Secondary | ICD-10-CM

## 2020-05-16 MED ORDER — HYDROCODONE-ACETAMINOPHEN 5-325 MG PO TABS: ORAL_TABLET | ORAL | 0 refills | Status: DC

## 2020-05-16 NOTE — Progress Notes (Signed)
Patient Kara Matthews:DXIPJA D [name], female DOB:07-27-1975, 45 y.o. SNK:[id number]  Chief Complaint  Patient presents with  . Elbow Pain    Right elbow pain,     HPI  Kara Matthews is a 45 y.o. female who has continued tennis elbow on the right. She has good and bad days. She has done the ice massage.  The injection helped some last time. She is out of medicine.  She has no redness.   Body mass index is 35.35 kg/m.  ROS  Review of Systems  Constitutional: Positive for activity change.  Musculoskeletal: Positive for arthralgias.  All other systems reviewed and are negative.   All other systems reviewed and are negative.  The following is a summary of the past history medically, past history surgically, known current medicines, social history and family history.  This information is gathered electronically by the computer from prior information and documentation.  I review this each visit and have found including this information at this point in the chart is beneficial and informative.    Past Medical History:  Diagnosis Date  . Anxiety   . Aortic stenosis    Probable bicuspid aortic valve  . Complication of anesthesia   . Essential hypertension   . Fibromyalgia   . Hyperlipidemia     Past Surgical History:  Procedure Laterality Date  . ABDOMINAL HYSTERECTOMY    . CESAREAN SECTION    . CHOLECYSTECTOMY    . LIPOMA EXCISION Right 05/05/2020   Procedure: EXCISION LIPOMA, THIGH, 6CM;  Surgeon: Virl Cagey, MD;  Location: [facility];  Service: General;  Laterality: Right;    Family History  Problem Relation Age of Onset  . Stroke Father   . Heart failure Brother   . Stroke Mother   . High blood pressure Mother     Social History Social History   Tobacco Use  . Smoking status: Current Every Day Smoker    Packs/day: 1.00    Years: 20.00    Pack years: 20.00    Types: Cigarettes  . Smokeless tobacco: Never Used  . Tobacco comment: maybe less than pack per day    Vaping Use  . Vaping Use: Never used  Substance Use Topics  . Alcohol use: No    Alcohol/week: 0.0 standard drinks  . Drug use: No    Allergies  Allergen Reactions  . Bactrim [Sulfamethoxazole-Trimethoprim] Hives  . Ciprofloxacin Hives  . Codeine Other (See Comments)    Stomach upset.  . Flagyl [Metronidazole] Other (See Comments)    Taste of "metal" in her mouth     Current Outpatient Medications  Medication Sig Dispense Refill  . bisoprolol-hydrochlorothiazide (ZIAC) 5-6.25 MG tablet Take 1 tablet by mouth in the morning and at bedtime.    . cetirizine (ZYRTEC) 10 MG tablet Take 10 mg by mouth daily.    . diphenhydramine-acetaminophen (TYLENOL PM) 25-500 MG TABS tablet Take 2 tablets by mouth at bedtime as needed (pain).    . fenofibrate (TRICOR) 48 MG tablet Take 48 mg by mouth daily as needed. Takes occasionally when she remembers.    . fluticasone (FLONASE) 50 MCG/ACT nasal spray Place 1 spray into both nostrils daily.    Marland Kitchen HYDROcodone-acetaminophen (NORCO/VICODIN) 5-325 MG tablet One tablet every six hours for pain.  Limit 7 days. 28 tablet 0  . melatonin 5 MG TABS Take 10 mg by mouth at bedtime.    . Multiple Vitamin (MULTIVITAMIN WITH MINERALS) TABS tablet Take 1 tablet by mouth 3 (three)  times a week.    . naproxen (NAPROSYN) 500 MG tablet Take 1 tablet (500 mg total) by mouth 2 (two) times daily with a meal. 60 tablet 5  . omeprazole (PRILOSEC) 20 MG capsule Take 20 mg by mouth daily.    . ondansetron (ZOFRAN) 4 MG tablet Take 1 tablet (4 mg total) by mouth every 8 (eight) hours as needed. 30 tablet 1  . PREMARIN 1.25 MG tablet TAKE 1 TABLET ONCE DAILY. (Patient taking differently: Take 1.25 mg by mouth at bedtime.) 30 tablet 11   No current facility-administered medications for this visit.     Physical Exam  Blood pressure (!) 130/92, pulse 66, height 4\' 11"  (1.499 m), weight 175 lb (79.4 kg).  Constitutional: overall normal hygiene, normal nutrition, well  developed, normal grooming, normal body habitus. Assistive device:none  Musculoskeletal: gait and station Limp none, muscle tone and strength are normal, no tremors or atrophy is present.  .  Neurological: coordination overall normal.  Deep tendon reflex/nerve stretch intact.  Sensation normal.  Cranial nerves II-XII intact.   Skin:   Normal overall no scars, lesions, ulcers or rashes. No psoriasis.  Psychiatric: Alert and oriented x 3.  Recent memory intact, remote memory unclear.  Normal mood and affect. Well groomed.  Good eye contact.  Cardiovascular: overall no swelling, no varicosities, no edema bilaterally, normal temperatures of the legs and arms, no clubbing, cyanosis and good capillary refill.  Lymphatic: palpation is normal.  Right lateral epicondyle is tender. ROM is full.  Pain to resisted dorsiextension. All other systems reviewed and are negative   The patient has been educated about the nature of the problem(s) and counseled on treatment options.  The patient appeared to understand what I have discussed and is in agreement with it.  Encounter Diagnosis  Name Primary?  . Medial epicondylitis, right Yes    PLAN Call if any problems.  Precautions discussed.  Continue current medications.   Return to clinic 2 weeks   I have reviewed the [place] web site prior to prescribing narcotic medicine for this patient.   Electronically Signed Sanjuana Kava, MD 3/29/20223:28 PM

## 2020-05-23 ENCOUNTER — Ambulatory Visit (INDEPENDENT_AMBULATORY_CARE_PROVIDER_SITE_OTHER): Payer: Medicaid Other | Admitting: General Surgery

## 2020-05-23 ENCOUNTER — Encounter: Payer: Self-pay | Admitting: General Surgery

## 2020-05-23 ENCOUNTER — Other Ambulatory Visit: Payer: Self-pay

## 2020-05-23 ENCOUNTER — Other Ambulatory Visit: Payer: Self-pay | Admitting: Obstetrics & Gynecology

## 2020-05-23 VITALS — BP 131/85 | HR 60 | Temp 97.4°F | Resp 16 | Ht 59.0 in | Wt 176.0 lb

## 2020-05-23 DIAGNOSIS — D1723 Benign lipomatous neoplasm of skin and subcutaneous tissue of right leg: Secondary | ICD-10-CM

## 2020-05-23 NOTE — Progress Notes (Signed)
[facility] Note   HPI:  45 y.o. Female presents to clinic for post-op follow-up evaluation of her right leg wound. Patient reports she is doing well and having no issues.  Review of Systems:  Continued back/ leg pain All other review of systems: otherwise negative   Vital Signs:  BP 131/85   Pulse 60   Temp (!) 97.4 F (36.3 C) (Other (Comment))   Resp 16   Ht 4\' 11"  (1.499 m)   Wt 176 lb (79.8 kg)   SpO2 97%   BMI 35.55 kg/m    Physical Exam:  Physical Exam Vitals reviewed.  Cardiovascular:     Rate and Rhythm: Normal rate.  Pulmonary:     Effort: Pulmonary effort is normal.  Musculoskeletal:     Comments: Right thigh incision c/d/i with no erythema or drainage, no swelling or signs of seroma   Neurological:     Mental Status: She is alert.    Pathology: Clinical History: lipoma, right thigh 6 cm  FINAL MICROSCOPIC DIAGNOSIS:   A. RIGHT LATERAL THIGH LIPOMA, EXCISION:  - Mature adipose tissue, consistent with lipoma    Assessment:  45 y.o. yo Female s/p right thigh lipoma excision. Doing well.  Plan:  Activity as tolerated Keep some compression on it with activity for now Hold on submerging until 3-4 weeks post op   PRN Follow up   Curlene Labrum, MD [facility], [place] [address] [phone] (office)

## 2020-05-30 ENCOUNTER — Ambulatory Visit: Payer: Medicaid Other | Admitting: Orthopaedic Surgery

## 2020-05-30 ENCOUNTER — Telehealth: Payer: Self-pay | Admitting: Orthopaedic Surgery

## 2020-05-30 MED ORDER — HYDROCODONE-ACETAMINOPHEN 5-325 MG PO TABS
ORAL_TABLET | ORAL | 0 refills | Status: DC
Start: 2020-05-30 — End: 2020-06-06

## 2020-05-30 NOTE — Telephone Encounter (Signed)
Done

## 2020-05-30 NOTE — Telephone Encounter (Signed)
Patient has called back again requesting refill for her pain medicine.

## 2020-06-06 ENCOUNTER — Encounter: Payer: Self-pay | Admitting: Orthopaedic Surgery

## 2020-06-06 ENCOUNTER — Other Ambulatory Visit: Payer: Self-pay

## 2020-06-06 ENCOUNTER — Ambulatory Visit (INDEPENDENT_AMBULATORY_CARE_PROVIDER_SITE_OTHER): Payer: Medicaid Other | Admitting: Orthopaedic Surgery

## 2020-06-06 VITALS — BP 115/78 | HR 60 | Ht 59.0 in | Wt 174.0 lb

## 2020-06-06 DIAGNOSIS — M7701 Medial epicondylitis, right elbow: Secondary | ICD-10-CM | POA: Diagnosis not present

## 2020-06-06 MED ORDER — HYDROCODONE-ACETAMINOPHEN 5-325 MG PO TABS
ORAL_TABLET | ORAL | 0 refills | Status: DC
Start: 2020-06-06 — End: 2020-06-19

## 2020-06-06 NOTE — Progress Notes (Signed)
Patient XH:BZJIRC D Kara Matthews, female DOB:March 29, 1975, 45 y.o. VEL:[id number]  Chief Complaint  Patient presents with  . Elbow Pain    right    HPI  Kara Matthews is a 45 y.o. female who continues to have pain of the right lateral elbow.  She has tried ice, injection, medicines.  She has no redness, no swelling.   Body mass index is 35.14 kg/m.  ROS  Review of Systems  Constitutional: Positive for activity change.  Musculoskeletal: Positive for arthralgias.  Psychiatric/Behavioral: Behavioral problem: .dx.  All other systems reviewed and are negative.   All other systems reviewed and are negative.  The following is a summary of the past history medically, past history surgically, known current medicines, social history and family history.  This information is gathered electronically by the computer from prior information and documentation.  I review this each visit and have found including this information at this point in the chart is beneficial and informative.    Past Medical History:  Diagnosis Date  . Anxiety   . Aortic stenosis    Probable bicuspid aortic valve  . Complication of anesthesia   . Essential hypertension   . Fibromyalgia   . Hyperlipidemia     Past Surgical History:  Procedure Laterality Date  . ABDOMINAL HYSTERECTOMY    . CESAREAN SECTION    . CHOLECYSTECTOMY    . LIPOMA EXCISION Right 05/05/2020   Procedure: EXCISION LIPOMA, THIGH, 6CM;  Surgeon: Virl Cagey, MD;  Location: [facility];  Service: General;  Laterality: Right;    Family History  Problem Relation Age of Onset  . Stroke Father   . Heart failure Brother   . Stroke Mother   . High blood pressure Mother     Social History Social History   Tobacco Use  . Smoking status: Current Every Day Smoker    Packs/day: 1.00    Years: 20.00    Pack years: 20.00    Types: Cigarettes  . Smokeless tobacco: Never Used  . Tobacco comment: maybe less than pack per day   Vaping Use  .  Vaping Use: Never used  Substance Use Topics  . Alcohol use: No    Alcohol/week: 0.0 standard drinks  . Drug use: No    Allergies  Allergen Reactions  . Bactrim [Sulfamethoxazole-Trimethoprim] Hives  . Ciprofloxacin Hives  . Codeine Other (See Comments)    Stomach upset.  . Flagyl [Metronidazole] Other (See Comments)    Taste of "metal" in her mouth     Current Outpatient Medications  Medication Sig Dispense Refill  . bisoprolol-hydrochlorothiazide (ZIAC) 5-6.25 MG tablet Take 1 tablet by mouth in the morning and at bedtime.    . cetirizine (ZYRTEC) 10 MG tablet Take 10 mg by mouth daily.    . diphenhydramine-acetaminophen (TYLENOL PM) 25-500 MG TABS tablet Take 2 tablets by mouth at bedtime as needed (pain).    Marland Kitchen estrogens, conjugated, (PREMARIN) 1.25 MG tablet Take 1 tablet (1.25 mg total) by mouth at bedtime. 30 tablet 11  . fenofibrate (TRICOR) 48 MG tablet Take 48 mg by mouth daily as needed. Takes occasionally when she remembers.    . fluticasone (FLONASE) 50 MCG/ACT nasal spray Place 1 spray into both nostrils daily.    . melatonin 5 MG TABS Take 10 mg by mouth at bedtime.    . Multiple Vitamin (MULTIVITAMIN WITH MINERALS) TABS tablet Take 1 tablet by mouth 3 (three) times a week.    . naproxen (NAPROSYN) 500 MG  tablet Take 1 tablet (500 mg total) by mouth 2 (two) times daily with a meal. 60 tablet 5  . omeprazole (PRILOSEC) 20 MG capsule Take 20 mg by mouth daily.    . ondansetron (ZOFRAN) 4 MG tablet Take 1 tablet (4 mg total) by mouth every 8 (eight) hours as needed. 30 tablet 1  . HYDROcodone-acetaminophen (NORCO/VICODIN) 5-325 MG tablet One tablet every six hours for pain.  Limit 7 days. 28 tablet 0   No current facility-administered medications for this visit.     Physical Exam  Blood pressure 115/78, pulse 60, height 4\' 11"  (1.499 m), weight 174 lb (78.9 kg).  Constitutional: overall normal hygiene, normal nutrition, well developed, normal grooming, normal body  habitus. Assistive device:none  Musculoskeletal: gait and station Limp none, muscle tone and strength are normal, no tremors or atrophy is present.  .  Neurological: coordination overall normal.  Deep tendon reflex/nerve stretch intact.  Sensation normal.  Cranial nerves II-XII intact.   Skin:   Normal overall no scars, lesions, ulcers or rashes. No psoriasis.  Psychiatric: Alert and oriented x 3.  Recent memory intact, remote memory unclear.  Normal mood and affect. Well groomed.  Good eye contact.  Cardiovascular: overall no swelling, no varicosities, no edema bilaterally, normal temperatures of the legs and arms, no clubbing, cyanosis and good capillary refill.  Lymphatic: palpation is normal.  She is very tender over the medial epicondyle.  She has no swelling.    All other systems reviewed and are negative   The patient has been educated about the nature of the problem(s) and counseled on treatment options.  The patient appeared to understand what I have discussed and is in agreement with it.  Encounter Diagnosis  Name Primary?  . Medial epicondylitis, right Yes    PLAN Call if any problems.  Precautions discussed.  Continue current medications.   Return to clinic 1 month   I have reviewed the [place] web site prior to prescribing narcotic medicine for this patient.   Electronically Signed Kara Kava, MD 4/19/20223:20 PM

## 2020-06-06 NOTE — Patient Instructions (Addendum)
Steps to Quit Smoking Smoking tobacco is the leading cause of preventable death. It can affect almost every organ in the body. Smoking puts you and people around you at risk for many serious, long-lasting (chronic) diseases. Quitting smoking can be hard, but it is one of the best things that you can do for your health. It is never too late to quit. How do I get ready to quit? When you decide to quit smoking, make a plan to help you succeed. Before you quit:  Pick a date to quit. Set a date within the next 2 weeks to give you time to prepare.  Write down the reasons why you are quitting. Keep this list in places where you will see it often.  Tell your family, friends, and co-workers that you are quitting. Their support is important.  Talk with your doctor about the choices that may help you quit.  Find out if your health insurance will pay for these treatments.  Know the people, places, things, and activities that make you want to smoke (triggers). Avoid them. What first steps can I take to quit smoking?  Throw away all cigarettes at home, at work, and in your car.  Throw away the things that you use when you smoke, such as ashtrays and lighters.  Clean your car. Make sure to empty the ashtray.  Clean your home, including curtains and carpets. What can I do to help me quit smoking? Talk with your doctor about taking medicines and seeing a counselor at the same time. You are more likely to succeed when you do both.  If you are pregnant or breastfeeding, talk with your doctor about counseling or other ways to quit smoking. Do not take medicine to help you quit smoking unless your doctor tells you to do so. To quit smoking: Quit right away  Quit smoking totally, instead of slowly cutting back on how much you smoke over a period of time.  Go to counseling. You are more likely to quit if you go to counseling sessions regularly. Take medicine You may take medicines to help you quit. Some  medicines need a prescription, and some you can buy over-the-counter. Some medicines may contain a drug called nicotine to replace the nicotine in cigarettes. Medicines may:  Help you to stop having the desire to smoke (cravings).  Help to stop the problems that come when you stop smoking (withdrawal symptoms). Your doctor may ask you to use:  Nicotine patches, gum, or lozenges.  Nicotine inhalers or sprays.  Non-nicotine medicine that is taken by mouth. Find resources Find resources and other ways to help you quit smoking and remain smoke-free after you quit. These resources are most helpful when you use them often. They include:  Online chats with a counselor.  Phone quitlines.  Printed self-help materials.  Support groups or group counseling.  Text messaging programs.  Mobile phone apps. Use apps on your mobile phone or tablet that can help you stick to your quit plan. There are many free apps for mobile phones and tablets as well as websites. Examples include Quit Guide from the CDC and smokefree.gov   What things can I do to make it easier to quit?  Talk to your family and friends. Ask them to support and encourage you.  Call a phone quitline (1-800-QUIT-NOW), reach out to support groups, or work with a counselor.  Ask people who smoke to not smoke around you.  Avoid places that make you want to smoke,   such as: ? Bars. ? Parties. ? Smoke-break areas at work.  Spend time with people who do not smoke.  Lower the stress in your life. Stress can make you want to smoke. Try these things to help your stress: ? Getting regular exercise. ? Doing deep-breathing exercises. ? Doing yoga. ? Meditating. ? Doing a body scan. To do this, close your eyes, focus on one area of your body at a time from head to toe. Notice which parts of your body are tense. Try to relax the muscles in those areas.   How will I feel when I quit smoking? Day 1 to 3 weeks Within the first 24 hours,  you may start to have some problems that come from quitting tobacco. These problems are very bad 2-3 days after you quit, but they do not often last for more than 2-3 weeks. You may get these symptoms:  Mood swings.  Feeling restless, nervous, angry, or annoyed.  Trouble concentrating.  Dizziness.  Strong desire for high-sugar foods and nicotine.  Weight gain.  Trouble pooping (constipation).  Feeling like you may vomit (nausea).  Coughing or a sore throat.  Changes in how the medicines that you take for other issues work in your body.  Depression.  Trouble sleeping (insomnia). Week 3 and afterward After the first 2-3 weeks of quitting, you may start to notice more positive results, such as:  Better sense of smell and taste.  Less coughing and sore throat.  Slower heart rate.  Lower blood pressure.  Clearer skin.  Better breathing.  Fewer sick days. Quitting smoking can be hard. Do not give up if you fail the first time. Some people need to try a few times before they succeed. Do your best to stick to your quit plan, and talk with your doctor if you have any questions or concerns. Summary  Smoking tobacco is the leading cause of preventable death. Quitting smoking can be hard, but it is one of the best things that you can do for your health.  When you decide to quit smoking, make a plan to help you succeed.  Quit smoking right away, not slowly over a period of time.  When you start quitting, seek help from your doctor, family, or friends. This information is not intended to replace advice given to you by your health care provider. Make sure you discuss any questions you have with your health care provider. Document Revised: 10/30/2018 Document Reviewed: 04/25/2018 Elsevier Patient Education  [place] [employer] TODAY AND TOMORROW.

## 2020-06-15 ENCOUNTER — Telehealth: Payer: Self-pay | Admitting: Orthopaedic Surgery

## 2020-06-15 NOTE — Telephone Encounter (Signed)
Patient called to refill her pain medicine   HYDROcodone-acetaminophen (NORCO/VICODIN) 5-325 MG tablet  Pharmacy:  [facility] in [place]  

## 2020-06-19 MED ORDER — HYDROCODONE-ACETAMINOPHEN 5-325 MG PO TABS: ORAL_TABLET | ORAL | 0 refills | Status: DC

## 2020-07-04 ENCOUNTER — Ambulatory Visit: Payer: Medicaid Other | Admitting: Orthopaedic Surgery

## 2020-07-04 ENCOUNTER — Encounter: Payer: Self-pay | Admitting: Orthopaedic Surgery

## 2020-07-04 ENCOUNTER — Other Ambulatory Visit: Payer: Self-pay

## 2020-07-04 VITALS — BP 132/83 | HR 68 | Ht 59.0 in | Wt 175.4 lb

## 2020-07-04 DIAGNOSIS — M7701 Medial epicondylitis, right elbow: Secondary | ICD-10-CM | POA: Diagnosis not present

## 2020-07-04 MED ORDER — HYDROCODONE-ACETAMINOPHEN 5-325 MG PO TABS
ORAL_TABLET | ORAL | 0 refills | Status: DC
Start: 2020-07-04 — End: 2020-07-18

## 2020-07-04 NOTE — Progress Notes (Signed)
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  She uses a sleeve.  Her pain has increased.  She wants an injection.  Encounter Diagnosis  Name Primary?  . Medial epicondylitis, right Yes   Procedure note: After permission from the patient and prep of the right elbow, I injected 1 % Xylocaine and 1 cc Celestone 6 mgm into the affected area by sterile technique tolerated well.  Return in six weeks.  I have reviewed the [facility] web site prior to prescribing narcotic medicine for this patient.   Electronically Signed Sanjuana Kava, MD 5/17/20223:10 PM

## 2020-07-07 ENCOUNTER — Other Ambulatory Visit: Payer: Self-pay | Admitting: Otolaryngology

## 2020-07-13 ENCOUNTER — Telehealth: Payer: Self-pay | Admitting: Orthopaedic Surgery

## 2020-07-13 ENCOUNTER — Other Ambulatory Visit: Payer: Self-pay | Admitting: Orthopaedic Surgery

## 2020-07-13 NOTE — Telephone Encounter (Signed)
Patient called to refill her pain medicine   HYDROcodone-acetaminophen (NORCO/VICODIN) 5-325 MG tablet  Pharmacy:  [facility] in [place]

## 2020-07-18 ENCOUNTER — Other Ambulatory Visit (INDEPENDENT_AMBULATORY_CARE_PROVIDER_SITE_OTHER): Payer: Self-pay

## 2020-07-18 MED ORDER — HYDROCODONE-ACETAMINOPHEN 5-325 MG PO TABS
ORAL_TABLET | ORAL | 0 refills | Status: DC
Start: 2020-07-18 — End: 2020-08-31

## 2020-07-19 NOTE — Telephone Encounter (Signed)
No more narcotics.  Use Advil, Aleve or Tylenol.  Use the ice massage.

## 2020-08-15 ENCOUNTER — Ambulatory Visit: Payer: Medicaid Other | Admitting: Orthopaedic Surgery

## 2020-08-29 ENCOUNTER — Encounter (HOSPITAL_BASED_OUTPATIENT_CLINIC_OR_DEPARTMENT_OTHER): Admission: RE | Payer: Self-pay | Source: Home / Self Care

## 2020-08-29 ENCOUNTER — Ambulatory Visit (HOSPITAL_BASED_OUTPATIENT_CLINIC_OR_DEPARTMENT_OTHER): Admission: RE | Admit: 2020-08-29 | Payer: Medicaid Other | Source: Home / Self Care | Admitting: Otolaryngology

## 2020-08-29 SURGERY — SEPTOPLASTY, NOSE, WITH NASAL TURBINATE REDUCTION
Anesthesia: General | Laterality: Bilateral

## 2020-08-31 ENCOUNTER — Encounter: Payer: Self-pay | Admitting: Orthopaedic Surgery

## 2020-08-31 ENCOUNTER — Ambulatory Visit: Payer: Medicaid Other | Admitting: Orthopaedic Surgery

## 2020-08-31 ENCOUNTER — Other Ambulatory Visit: Payer: Self-pay

## 2020-08-31 VITALS — BP 132/88 | HR 74 | Ht 59.0 in | Wt 173.0 lb

## 2020-08-31 DIAGNOSIS — M7701 Medial epicondylitis, right elbow: Secondary | ICD-10-CM | POA: Diagnosis not present

## 2020-08-31 MED ORDER — HYDROCODONE-ACETAMINOPHEN 7.5-325 MG PO TABS: ORAL_TABLET | ORAL | 0 refills | Status: DC

## 2020-08-31 NOTE — Progress Notes (Signed)
My elbow is worse.  She has right medial epicondylitis of the elbow.  She has had more pain in the last few weeks.  She works two jobs.  She has no trauma, no redness, no numbness.  The right elbow is very tender over the medial epicondyle.  ROM is full.  NV intact.  Encounter Diagnosis  Name Primary?   Medial epicondylitis, right Yes   After permission from the patient, the right medial elbow was prepped and then I injected 2 cc plain 1% Xylocaine and 1 cc Celestone 6 mgm into the area of tenderness of the medial epicondyle by sterile technique tolerated well.  I will call in pain medicine.  I have reviewed the [place] web site prior to prescribing narcotic medicine for this patient.  Continue the Naprosyn. She has not been taking it recently.  Return in five weeks.  Call if any problem.  Precautions discussed.  Electronically Signed Sanjuana Kava, MD 7/14/20229:38 AM

## 2020-09-06 ENCOUNTER — Telehealth: Payer: Self-pay | Admitting: Orthopaedic Surgery

## 2020-09-19 ENCOUNTER — Other Ambulatory Visit: Payer: Self-pay | Admitting: Orthopaedic Surgery

## 2020-09-19 ENCOUNTER — Telehealth: Payer: Self-pay | Admitting: Orthopaedic Surgery

## 2020-09-19 MED ORDER — HYDROCODONE-ACETAMINOPHEN 7.5-325 MG PO TABS: ORAL_TABLET | ORAL | 0 refills | Status: DC

## 2020-10-04 ENCOUNTER — Telehealth: Payer: Self-pay | Admitting: Orthopaedic Surgery

## 2020-11-20 ENCOUNTER — Other Ambulatory Visit: Payer: Self-pay | Admitting: Orthopaedic Surgery

## 2020-11-20 NOTE — Telephone Encounter (Signed)
No more narcotics

## 2020-12-25 ENCOUNTER — Other Ambulatory Visit (HOSPITAL_COMMUNITY): Payer: Self-pay | Admitting: Physician Assistant

## 2020-12-25 DIAGNOSIS — Z1231 Encounter for screening mammogram for malignant neoplasm of breast: Secondary | ICD-10-CM

## 2020-12-28 ENCOUNTER — Encounter (INDEPENDENT_AMBULATORY_CARE_PROVIDER_SITE_OTHER): Payer: Self-pay | Admitting: *Deleted

## 2021-01-05 ENCOUNTER — Ambulatory Visit (HOSPITAL_COMMUNITY): Payer: Medicaid Other

## 2021-01-18 ENCOUNTER — Other Ambulatory Visit: Payer: Self-pay

## 2021-01-18 ENCOUNTER — Ambulatory Visit (HOSPITAL_COMMUNITY)
Admission: RE | Admit: 2021-01-18 | Discharge: 2021-01-18 | Disposition: A | Discharge status: Home / Self Care | Payer: Medicaid Other | Source: Ambulatory Visit | Attending: Physician Assistant | Admitting: Physician Assistant

## 2021-01-18 DIAGNOSIS — Z1231 Encounter for screening mammogram for malignant neoplasm of breast: Secondary | ICD-10-CM | POA: Diagnosis present

## 2021-02-05 ENCOUNTER — Encounter: Payer: Self-pay | Admitting: Cardiology

## 2021-02-05 ENCOUNTER — Ambulatory Visit (INDEPENDENT_AMBULATORY_CARE_PROVIDER_SITE_OTHER): Payer: Medicaid Other | Admitting: Cardiology

## 2021-02-05 VITALS — BP 118/82 | HR 69 | Ht 59.0 in | Wt 174.8 lb

## 2021-02-05 DIAGNOSIS — R002 Palpitations: Secondary | ICD-10-CM | POA: Diagnosis not present

## 2021-02-05 DIAGNOSIS — I350 Nonrheumatic aortic (valve) stenosis: Secondary | ICD-10-CM | POA: Diagnosis not present

## 2021-02-05 NOTE — Progress Notes (Signed)
Cardiology Office Note  Date: 02/05/2021   ID: Kara Matthews, DOB 20-Sep-1975, MRN [id number]  PCP:  Raiford Simmonds., PA-C  Cardiologist:  Rozann Lesches, MD Electrophysiologist:  None   Chief Complaint  Patient presents with   Cardiac follow-up    History of Present Illness: Kara Matthews is a 45 y.o. female last seen in June 2021.  She is here for a follow-up visit.  She does not report any regular exertional symptoms.  Does recall an episode of chest and back pain that occurred about 3 months ago, she was trying to work 3 jobs at that time and under a lot of stress.  She has a suspected bicuspid aortic valve with evidence of mild to moderate aortic stenosis by echocardiogram in April 2021.  We discussed getting a follow-up study for surveillance.  She does not report any other major changes in health.  I reviewed her medications which are noted below.  I personally reviewed her ECG today which shows sinus rhythm with nonspecific ST changes.  Past Medical History:  Diagnosis Date   Anxiety    Aortic stenosis    Probable bicuspid aortic valve   Complication of anesthesia    Essential hypertension    Fibromyalgia    Hyperlipidemia     Past Surgical History:  Procedure Laterality Date   ABDOMINAL HYSTERECTOMY     CESAREAN SECTION     CHOLECYSTECTOMY     LIPOMA EXCISION Right 05/05/2020   Procedure: EXCISION LIPOMA, THIGH, 6CM;  Surgeon: Kara Cagey, MD;  Location: [facility];  Service: General;  Laterality: Right;    Current Outpatient Medications  Medication Sig Dispense Refill   bisoprolol-hydrochlorothiazide (ZIAC) 5-6.25 MG tablet Take 1 tablet by mouth in the morning and at bedtime.     cetirizine (ZYRTEC) 10 MG tablet Take 10 mg by mouth daily.     diphenhydramine-acetaminophen (TYLENOL PM) 25-500 MG TABS tablet Take 2 tablets by mouth at bedtime as needed (pain).     estrogens, conjugated, (PREMARIN) 1.25 MG tablet Take 1 tablet (1.25 mg total) by  mouth at bedtime. 30 tablet 11   fenofibrate (TRICOR) 48 MG tablet Take 48 mg by mouth daily as needed. Takes occasionally when she remembers.     fluticasone (FLONASE) 50 MCG/ACT nasal spray Place 1 spray into both nostrils daily.     melatonin 5 MG TABS Take 10 mg by mouth at bedtime.     Multiple Vitamin (MULTIVITAMIN WITH MINERALS) TABS tablet Take 1 tablet by mouth 3 (three) times a week.     omeprazole (PRILOSEC) 20 MG capsule Take 20 mg by mouth daily.     No current facility-administered medications for this visit.   Allergies:  Bactrim [sulfamethoxazole-trimethoprim], Ciprofloxacin, Codeine, and Flagyl [metronidazole]   ROS: No palpitations or syncope.  Physical Exam: VS:  BP 118/82    Pulse 69    Ht 4\' 11"  (1.499 m)    Wt 174 lb 12.8 oz (79.3 kg)    SpO2 98%    BMI 35.31 kg/m , BMI Body mass index is 35.31 kg/m.  Wt Readings from Last 3 Encounters:  02/05/21 174 lb 12.8 oz (79.3 kg)  08/31/20 173 lb (78.5 kg)  07/04/20 175 lb 6.4 oz (79.6 kg)    General: Patient appears comfortable at rest. HEENT: Conjunctiva and lids normal, wearing a mask. Neck: Supple, no elevated JVP or carotid bruits, no thyromegaly. Lungs: Clear to auscultation, nonlabored breathing at rest. Cardiac: Regular rate and  rhythm, no S3, 1-1/0 basal systolic murmur with preserved second heart sound, no pericardial rub. Extremities: No pitting edema.  ECG:  An ECG dated 02/20/2020 was personally reviewed today and demonstrated:  Sinus rhythm with nonspecific ST-T changes.  Recent Labwork: 02/20/2020: Hemoglobin 12.9; Platelets 169 05/03/2020: BUN 14; Creatinine, Ser 0.69; Potassium 3.4; Sodium 140   Other Studies Reviewed Today:  Echocardiogram 06/09/2019:  1. Left ventricular ejection fraction, by estimation, is 70 to 75%. The  left ventricle has hyperdynamic function. The left ventricle has no  regional wall motion abnormalities. There is mild left ventricular  hypertrophy. Left ventricular diastolic   parameters were normal.   2. Right ventricular systolic function is normal. The right ventricular  size is normal. There is normal pulmonary artery systolic pressure. The  estimated right ventricular systolic pressure is 31.5 mmHg.   3. Left atrial size was mildly dilated.   4. The mitral valve is grossly normal. Mild mitral valve regurgitation.   5. The aortic valve has an indeterminant number of cusps, possibly  bicuspid. Aortic valve regurgitation is not visualized. Mild to moderate  aortic valve stenosis. Aortic valve area, by VTI measures 1.41 cm. Aortic  valve mean gradient measures 16.2  mmHg.   6. The inferior vena cava is normal in size with greater than 50%  respiratory variability, suggesting right atrial pressure of 3 mmHg.   Assessment and Plan:  1.  Bicuspid aortic valve with mild to moderate aortic stenosis by echocardiogram in April 2021.  Plan will be to obtain a follow-up study for surveillance, most likely clinical reassessment in 1 year unless there has been substantial progression.  2.  History of palpitations and previously documented brief episodes of SVT.  No significant progression in symptoms at this point.  She is on Ziac.  ECG reviewed and stable.  Medication Adjustments/Labs and Tests Ordered: Current medicines are reviewed at length with the patient today.  Concerns regarding medicines are outlined above.   Tests Ordered: Orders Placed This Encounter  Procedures   EKG 12-Lead   ECHOCARDIOGRAM COMPLETE    Medication Changes: No orders of the defined types were placed in this encounter.   Disposition:  Follow up  1 year.  Signed, Satira Sark, MD, [facility] 02/05/2021 5:01 PM    [facility] [place], [place], [place] [address] Phone: [phone]; Fax: [phone]

## 2021-02-05 NOTE — Patient Instructions (Addendum)
Medication Instructions:  Your physician recommends that you continue on your current medications as directed. Please refer to the Current Medication list given to you today.  Labwork: none  Testing/Procedures: Your physician has requested that you have an echocardiogram. Echocardiography is a painless test that uses sound waves to create images of your heart. It provides your doctor with information about the size and shape of your heart and how well your hearts chambers and valves are working. This procedure takes approximately one hour. There are no restrictions for this procedure.  Follow-Up: Your physician recommends that you schedule a follow-up appointment in: 1 year. You will receive a reminder call or letter in the mail in about 10 months reminding you to call and schedule your appointment. If you don't receive this letter, please contact our office.  Any Other Special Instructions Will Be Listed Below (If Applicable).  If you need a refill on your cardiac medications before your next appointment, please call your pharmacy.

## 2021-03-22 ENCOUNTER — Other Ambulatory Visit: Payer: Medicaid Other

## 2021-04-26 ENCOUNTER — Other Ambulatory Visit: Payer: Medicaid Other

## 2021-05-01 ENCOUNTER — Other Ambulatory Visit: Payer: Self-pay

## 2021-05-01 ENCOUNTER — Ambulatory Visit (INDEPENDENT_AMBULATORY_CARE_PROVIDER_SITE_OTHER): Payer: Medicaid Other

## 2021-05-01 DIAGNOSIS — I350 Nonrheumatic aortic (valve) stenosis: Secondary | ICD-10-CM

## 2021-05-02 LAB — ECHOCARDIOGRAM COMPLETE
AR max vel: 1.26 cm2
AV Area VTI: 1.4 cm2
AV Area mean vel: 1.33 cm2
AV Mean grad: 18 mmHg
AV Peak grad: 32.1 mmHg
Ao pk vel: 2.84 m/s
Area-P 1/2: 4.49 cm2
MV M vel: 4.54 m/s
MV Peak grad: 82.4 mmHg
S' Lateral: 2.15 cm

## 2021-06-05 ENCOUNTER — Other Ambulatory Visit: Payer: Self-pay | Admitting: Obstetrics & Gynecology

## 2021-06-13 ENCOUNTER — Other Ambulatory Visit: Payer: Self-pay | Admitting: Obstetrics & Gynecology

## 2021-06-18 ENCOUNTER — Encounter (INDEPENDENT_AMBULATORY_CARE_PROVIDER_SITE_OTHER): Payer: Self-pay | Admitting: *Deleted

## 2021-07-17 ENCOUNTER — Other Ambulatory Visit: Payer: Self-pay | Admitting: Obstetrics & Gynecology

## 2022-01-21 ENCOUNTER — Ambulatory Visit: Payer: Medicaid Other | Attending: Nurse Practitioner | Admitting: Nurse Practitioner

## 2022-01-21 ENCOUNTER — Encounter: Payer: Self-pay | Admitting: Nurse Practitioner

## 2022-01-21 VITALS — BP 136/86 | HR 71 | Ht 59.0 in | Wt 181.0 lb

## 2022-01-21 DIAGNOSIS — I350 Nonrheumatic aortic (valve) stenosis: Secondary | ICD-10-CM | POA: Diagnosis not present

## 2022-01-21 DIAGNOSIS — I10 Essential (primary) hypertension: Secondary | ICD-10-CM | POA: Diagnosis not present

## 2022-01-21 DIAGNOSIS — R079 Chest pain, unspecified: Secondary | ICD-10-CM

## 2022-01-21 DIAGNOSIS — R002 Palpitations: Secondary | ICD-10-CM

## 2022-01-21 DIAGNOSIS — Z01818 Encounter for other preprocedural examination: Secondary | ICD-10-CM

## 2022-01-21 DIAGNOSIS — E6609 Other obesity due to excess calories: Secondary | ICD-10-CM

## 2022-01-21 DIAGNOSIS — E785 Hyperlipidemia, unspecified: Secondary | ICD-10-CM

## 2022-01-21 DIAGNOSIS — Z6835 Body mass index (BMI) 35.0-35.9, adult: Secondary | ICD-10-CM

## 2022-01-21 MED ORDER — METOPROLOL TARTRATE 100 MG PO TABS: ORAL_TABLET | ORAL | 0 refills | Status: DC

## 2022-01-21 NOTE — Progress Notes (Unsigned)
Cardiology Office Note:    Date:  01/21/2022  ID:  Kara Matthews, DOB 08-Dec-1975, MRN [id number]  PCP:  Raiford Simmonds., [place] Providers Cardiologist:  Rozann Lesches, MD     Referring MD: Raiford Simmonds., PA-C   CC: Here for 1 year follow-up  History of Present Illness:    Kara Matthews is a 46 y.o. female with a hx of the following:  Hx of palpitations HTN HLD Fibromyalgia Mild to moderate AS   Patient is a 46 year old female with past medical history as mentioned above.  Previous cardiovascular history included Myoview that was overall low risk, no definite ischemia and normal LVEF noted.  Wore a 72-hour ZIO in 2020 and was overall in normal sinus rhythm, did have some brief episodes of SVT though was likely contributing to her palpitations, however these are not sustained and were considered benign arrhythmias.  Serial echocardiograms have been performed due to evidence of aortic stenosis seen on previous imaging studies.  Last evaluated by Dr. Domenic Polite 1 year ago.  She did note an episode of back and chest pain, 3 months prior to visit and attributed this to stress that she was working 3 jobs at the time.  Denied any exertional symptoms.  In 2021 she had an echocardiogram done that revealed suspected bicuspid aortic valve with mild to moderate AS.  Updated echocardiogram revealed EF 60 to 65%, mild to moderate aortic valve stenosis, mean gradient 18 mmHg, was recommended to follow-up in 1 year to monitor.   Today she presents for follow-up evaluation.  She states she is doing "pretty good."  Chief complaint is feeling more tired than usual for the past couple months, does admit to sharp chest and back pain intermittently, with exact same symptomatology as 1 year ago when she saw Dr. Domenic Polite, she states this is chronic and stable has been ongoing for many years. Does have intermittent palpitations occasionally. Denies any shortness of breath,  syncope, presyncope, dizziness, orthopnea, PND, swelling, acute bleeding, or claudication.  Tells me she is not the best at taking her blood pressure medications, she states she has not taken her BP meds in the past 3 days, says it is easy for her to forget.  Does admit to weight gain from 1 year ago, says she is not as active as she was previously.  She stays busy by providing care to a 46 year old.  Denies any other questions or concerns today.   Past Medical History:  Diagnosis Date   Anxiety    Aortic stenosis    Probable bicuspid aortic valve   Complication of anesthesia    Essential hypertension    Fibromyalgia    Hyperlipidemia     Past Surgical History:  Procedure Laterality Date   ABDOMINAL HYSTERECTOMY     CESAREAN SECTION     CHOLECYSTECTOMY     LIPOMA EXCISION Right 05/05/2020   Procedure: EXCISION LIPOMA, THIGH, 6CM;  Surgeon: Virl Cagey, MD;  Location: [facility];  Service: General;  Laterality: Right;    Current Medications: Current Meds  Medication Sig   bisoprolol-hydrochlorothiazide (ZIAC) 5-6.25 MG tablet Take 1 tablet by mouth in the morning and at bedtime.   cetirizine (ZYRTEC) 10 MG tablet Take 10 mg by mouth daily.   diphenhydramine-acetaminophen (TYLENOL PM) 25-500 MG TABS tablet Take 2 tablets by mouth at bedtime as needed (pain).   fluticasone (FLONASE) 50 MCG/ACT nasal spray Place 1 spray into both nostrils daily.  melatonin 5 MG TABS Take 10 mg by mouth at bedtime.   Multiple Vitamin (MULTIVITAMIN WITH MINERALS) TABS tablet Take 1 tablet by mouth 3 (three) times a week.   omeprazole (PRILOSEC) 20 MG capsule Take 20 mg by mouth daily.   PREMARIN 1.25 MG tablet TAKE 1 TABLET ONCE DAILY.   PREMARIN 1.25 MG tablet TAKE 1 TABLET ONCE DAILY.     Allergies:   Bactrim [sulfamethoxazole-trimethoprim], Ciprofloxacin, Codeine, and Flagyl [metronidazole]   Social History   Socioeconomic History   Marital status: Married    Spouse name: Not on file    Number of children: Not on file   Years of education: Not on file   Highest education level: Not on file  Occupational History   Not on file  Tobacco Use   Smoking status: Every Day    Packs/day: 1.00    Years: 20.00    Total pack years: 20.00    Types: Cigarettes   Smokeless tobacco: Never   Tobacco comments:    maybe less than pack per day   Vaping Use   Vaping Use: Never used  Substance and Sexual Activity   Alcohol use: No    Alcohol/week: 0.0 standard drinks of alcohol   Drug use: No   Sexual activity: Yes    Birth control/protection: Surgical    Comment: hyst  Other Topics Concern   Not on file  Social History Narrative   Not on file   Social Determinants of Health   Financial Resource Strain: Not on file  Food Insecurity: Not on file  Transportation Needs: Not on file  Physical Activity: Not on file  Stress: Not on file  Social Connections: Not on file     Family History: The patient's family history includes Heart failure in her brother; High blood pressure in her mother; Stroke in her father and mother.  ROS:   Review of Systems  Constitutional:  Positive for malaise/fatigue. Negative for chills, diaphoresis, fever and weight loss.  HENT: Negative.    Eyes: Negative.   Respiratory: Negative.    Cardiovascular:  Positive for chest pain and palpitations. Negative for orthopnea, claudication, leg swelling and PND.  Gastrointestinal: Negative.   Genitourinary: Negative.   Musculoskeletal: Negative.   Skin: Negative.   Neurological: Negative.   Endo/Heme/Allergies: Negative.   Psychiatric/Behavioral: Negative.      Please see the history of present illness.    All other systems reviewed and are negative.  EKGs/Labs/Other Studies Reviewed:    The following studies were reviewed today:   EKG:  EKG is ordered today.  The ekg ordered today demonstrates NSR, 71 bpm, without acute ischemic changes.   2D complete echocardiogram on May 01, 2021:  1.  Left ventricular ejection fraction, by estimation, is 60 to 65%. The  left ventricle has normal function. The left ventricle has no regional  wall motion abnormalities. There is moderate left ventricular hypertrophy.  Left ventricular diastolic  parameters were normal. The average left ventricular global longitudinal  strain is -22.0 %. The global longitudinal strain is normal.   2. Right ventricular systolic function is normal. The right ventricular  size is normal. Tricuspid regurgitation signal is inadequate for assessing  PA pressure.   3. The mitral valve is normal in structure. Mild mitral valve  regurgitation. No evidence of mitral stenosis.   4. The aortic valve has an indeterminant number of cusps. Aortic valve  regurgitation is not visualized. Mild to moderate aortic valve stenosis.  Aortic valve  mean gradient measures 18.0 mmHg. Aortic valve peak gradient  measures 32.1 mmHg. Aortic valve  area, by VTI measures 1.40 cm.   72-hour ZIO monitor on November 11, 2018: Predominant rhythm is sinus with heart rate ranging from 43 bpm up to 93 bpm and average heart rate 61 bpm. Brief episodes of SVT were noted, the longest lasting approximately 12 seconds and with heart rate ranging from 107-135 during these brief events. There were only rare PACs and PVCs otherwise. No sustained arrhythmias or pauses.  Myoview on August 29, 2017: There was no ST segment deviation noted during stress. The study is normal. Large moderate intensity fixed anterior defect with normal wall motion, consistent with breast attenuation though the defect is quite large. Based on body habitus (4'11, 181 lbs, BMI 37) this could explain the size of defect. There is clearly normal anterior wall motion and no reversibility. This is a low risk study. The left ventricular ejection fraction is hyperdynamic (>65%).  Recent Labs: No results found for requested labs within last 365 days.  Recent Lipid Panel No results  found for: "CHOL", "TRIG", "HDL", "CHOLHDL", "VLDL", "LDLCALC", "LDLDIRECT"       Physical Exam:    VS:  BP 136/86   Pulse 71   Ht '4\' 11"'$  (1.499 m)   Wt 181 lb (82.1 kg)   SpO2 98%   BMI 36.56 kg/m     Wt Readings from Last 3 Encounters:  01/21/22 181 lb (82.1 kg)  02/05/21 174 lb 12.8 oz (79.3 kg)  08/31/20 173 lb (78.5 kg)     GEN: Obese, 46 year old female in no acute distress HEENT: Normal NECK: No JVD; No carotid bruits CARDIAC: S1/S2, RRR, no murmurs, rubs, gallops; 2+ peripheral pulses throughout, strong and equal bilaterally RESPIRATORY:  Clear and diminished to auscultation without rales, wheezing or rhonchi  ABDOMEN: Soft, non-tender, non-distended MUSCULOSKELETAL:  No edema; No deformity  SKIN: Warm and dry NEUROLOGIC:  Alert and oriented x 3 PSYCHIATRIC:  Normal affect   ASSESSMENT:    1. Chest pain of uncertain etiology   2. Nonrheumatic aortic valve stenosis   3. Palpitations   4. Hypertension, unspecified type   5. Hyperlipidemia, unspecified hyperlipidemia type   6. Pre-op testing   7. Class 2 obesity due to excess calories without serious comorbidity with body mass index (BMI) of 35.0 to 35.9 in adult    PLAN:    In order of problems listed above:  Chest pain of uncertain etiology Still experiencing exact same symptoms she had with Dr. Harl Bowie 1 year ago, sharp chest pain along chest and back, typically not associated with exertion.  Symptoms have been chronic, stable for many years.  Stress test in 2019 was negative. EKG today did not reveal anything acute.  Discussed different options ischemic evaluation, and she is agreeable to undergo coronary CTA; however, she would like this scheduled in January.  Will prescribe one-time dose of metoprolol tartrate 100 mg to be taken 2 hours prior to testing. Will check BMET. ED precautions discussed. Continue current medication regimen. Heart healthy diet and regular cardiovascular exercise encouraged.   Mild to  moderate AS Does experience fatigue, has been ongoing for the past several months, with intermittent sharp pain along chest and back with similar symptoms as noted 1 year ago, not associated with exertion.  Echocardiogram in March 2023 revealed stable mild to moderate aortic stenosis, mean gradient 18 mmHg.  It was recommended to follow-up in 1 year for monitoring.  Will arrange  repeat TTE in March 2024 and arrange CCTA as mentioned above. ED precautions discussed.  Continue current medication regimen. Heart healthy diet and regular cardiovascular exercise encouraged.   Hx of palpitations Denies any recent or significant palpitations. Continue current medication regimen. Heart healthy diet and regular cardiovascular exercise encouraged.  Discussed to avoid caffeine and energy drinks.  HTN BP on arrival 136/86.  States she has not taken her BP medication this morning.  Discussed ways including setting alarms on phone to remind herself to take her blood pressure medication. Continue Ziac. BP log given today and discussed to monitor BP at home at least 2 hours after medications and sitting for 5-10 minutes. Heart healthy diet and regular cardiovascular exercise encouraged.   HLD I do not have recent lab work on file.  She stated she will be going to the health department to get lab work drawn in the future.  I recommended that she obtain lipid panel and LFT at next visit at the health department.   5. Class 2 Obesity BMI today is 36.56. Weight loss via diet and exercise encouraged. Discussed the impact being overweight would have on cardiovascular risk. Heart healthy diet and regular cardiovascular exercise encouraged.   6. Disposition: Follow-up with me or Dr. Domenic Polite in 2 months or sooner if anything changes.      Medication Adjustments/Labs and Tests Ordered: Current medicines are reviewed at length with the patient today.  Concerns regarding medicines are outlined above.  Orders Placed This  Encounter  Procedures   CT CORONARY MORPH W/CTA COR W/SCORE W/CA W/CM &/OR WO/CM   Basic metabolic panel   EKG 22-LNLG   ECHOCARDIOGRAM COMPLETE   Meds ordered this encounter  Medications   metoprolol tartrate (LOPRESSOR) 100 MG tablet    Sig: Take 100 mg 2 hours before cardiac CT    Dispense:  1 tablet    Refill:  0    Patient Instructions  Medication Instructions:  Your physician recommends that you continue on your current medications as directed. Please refer to the Current Medication list given to you today.  Take Lopressor 100 mg 2 hours before cardiac CT  Labwork: BMET before Cardiac CT  Testing/Procedures: Your physician has requested that you have an echocardiogram in March 2024. Echocardiography is a painless test that uses sound waves to create images of your heart. It provides your doctor with information about the size and shape of your heart and how well your heart's chambers and valves are working. This procedure takes approximately one hour. There are no restrictions for this procedure. Please do NOT wear cologne, perfume, aftershave, or lotions (deodorant is allowed). Please arrive 15 minutes prior to your appointment time.   Follow-Up: 2 months  Any Other Special Instructions Will Be Listed Below (If Applicable).  If you need a refill on your cardiac medications before your next appointment, please call your pharmacy.      Your cardiac CT will be scheduled at one of the below locations:   [facility] [address] [place], [place] [address] [phone]  If scheduled at [facility], please arrive at the [facility] (Entrance C2) of [facility] 30 minutes prior to test start time. You can use the FREE valet parking offered at entrance C (encouraged to control the heart rate for the test)  Proceed to the [facility] Radiology Department (first floor) to check-in and test prep.  All radiology patients  and guests should use entrance C2  at [facility], accessed from [facility], even though the hospital's physical address listed is [address].    Please follow these instructions carefully (unless otherwise directed): On the Night Before the Test: Be sure to Drink plenty of water. Do not consume any caffeinated/decaffeinated beverages or chocolate 12 hours prior to your test. Do not take any antihistamines 12 hours prior to your test. On the Day of the Test: Drink plenty of water until 1 hour prior to the test. Do not eat any food 1 hour prior to test. You may take your regular medications prior to the test.  Take metoprolol (Lopressor) two hours prior to test. HOLD Furosemide/Hydrochlorothiazide morning of the test. FEMALES- please wear underwire-free bra if available, avoid dresses & tight clothing       After the Test: Drink plenty of water. After receiving IV contrast, you may experience a mild flushed feeling. This is normal. On occasion, you may experience a mild rash up to 24 hours after the test. This is not dangerous. If this occurs, you can take Benadryl 25 mg and increase your fluid intake. If you experience trouble breathing, this can be serious. If it is severe call 911 IMMEDIATELY. If it is mild, please call our office. If you take any of these medications: Glipizide/Metformin, Avandament, Glucavance, please do not take 48 hours after completing test unless otherwise instructed.  We will call to schedule your test 2-4 weeks out understanding that some insurance companies will need an authorization prior to the service being performed.   For non-scheduling related questions, please contact the cardiac imaging nurse navigator should you have any questions/concerns: Marchia Bond, Cardiac Imaging Nurse Navigator Gordy Clement, Cardiac Imaging Nurse Navigator [facility] Direct Office Dial: [phone]   For  scheduling needs, including cancellations and rescheduling, please call [place], [phone].    Mediterranean Diet A Mediterranean diet refers to food and lifestyle choices that are based on the traditions of countries located on the [employer]. It focuses on eating more fruits, vegetables, whole grains, beans, nuts, seeds, and heart-healthy fats, and eating less dairy, meat, eggs, and processed foods with added sugar, salt, and fat. This way of eating has been shown to help prevent certain conditions and improve outcomes for people who have chronic diseases, like kidney disease and heart disease. What are tips for following this plan? Reading food labels Check the serving size of packaged foods. For foods such as rice and pasta, the serving size refers to the amount of cooked product, not dry. Check the total fat in packaged foods. Avoid foods that have saturated fat or trans fats. Check the ingredient list for added sugars, such as corn syrup. Shopping  Buy a variety of foods that offer a balanced diet, including: Fresh fruits and vegetables (produce). Grains, beans, nuts, and seeds. Some of these may be available in unpackaged forms or large amounts (in bulk). Fresh seafood. Poultry and eggs. Low-fat dairy products. Buy whole ingredients instead of prepackaged foods. Buy fresh fruits and vegetables in-season from local farmers markets. Buy plain frozen fruits and vegetables. If you do not have access to quality fresh seafood, buy precooked frozen shrimp or canned fish, such as tuna, salmon, or sardines. Stock your pantry so you always have certain foods on hand, such as olive oil, canned tuna, canned tomatoes, rice, pasta, and beans. Cooking Cook foods with extra-virgin olive oil instead of using butter or other vegetable oils. Have meat as a  side dish, and have vegetables or grains as your main dish. This means having meat in small portions or adding small amounts of meat to  foods like pasta or stew. Use beans or vegetables instead of meat in common dishes like chili or lasagna. Experiment with different cooking methods. Try roasting, broiling, steaming, and sauting vegetables. Add frozen vegetables to soups, stews, pasta, or rice. Add nuts or seeds for added healthy fats and plant protein at each meal. You can add these to yogurt, salads, or vegetable dishes. Marinate fish or vegetables using olive oil, lemon juice, garlic, and fresh herbs. Meal planning Plan to eat one vegetarian meal one day each week. Try to work up to two vegetarian meals, if possible. Eat seafood two or more times a week. Have healthy snacks readily available, such as: Vegetable sticks with hummus. Greek yogurt. Fruit and nut trail mix. Eat balanced meals throughout the week. This includes: Fruit: 2-3 servings a day. Vegetables: 4-5 servings a day. Low-fat dairy: 2 servings a day. Fish, poultry, or lean meat: 1 serving a day. Beans and legumes: 2 or more servings a week. Nuts and seeds: 1-2 servings a day. Whole grains: 6-8 servings a day. Extra-virgin olive oil: 3-4 servings a day. Limit red meat and sweets to only a few servings a month. Lifestyle  Cook and eat meals together with your family, when possible. Drink enough fluid to keep your urine pale yellow. Be physically active every day. This includes: Aerobic exercise like running or swimming. Leisure activities like gardening, walking, or housework. Get 7-8 hours of sleep each night. If recommended by your health care provider, drink red wine in moderation. This means 1 glass a day for nonpregnant women and 2 glasses a day for men. A glass of wine equals 5 oz (150 mL). What foods should I eat? Fruits Apples. Apricots. Avocado. Berries. Bananas. Cherries. Dates. Figs. Grapes. Lemons. Melon. Oranges. Peaches. Plums. Pomegranate. Vegetables Artichokes. Beets. Broccoli. Cabbage. Carrots. Eggplant. Green beans. Chard. Kale.  Spinach. Onions. Leeks. Peas. Squash. Tomatoes. Peppers. Radishes. Grains Whole-grain pasta. Brown rice. Bulgur wheat. Polenta. Couscous. Whole-wheat bread. [place]. Meats and other proteins Beans. Almonds. Sunflower seeds. Pine nuts. Peanuts. [place]. Salmon. Scallops. Shrimp. [place]. Tilapia. Clams. Oysters. Eggs. Poultry without skin. Dairy Low-fat milk. Cheese. Greek yogurt. Fats and oils Extra-virgin olive oil. Avocado oil. Grapeseed oil. Beverages Water. Red wine. Herbal tea. Sweets and desserts Greek yogurt with honey. Baked apples. Poached pears. Trail mix. Seasonings and condiments Basil. Cilantro. Coriander. Cumin. Mint. Parsley. Sage. Rosemary. Tarragon. Garlic. Oregano. Thyme. Pepper. Balsamic vinegar. Tahini. Hummus. Tomato sauce. Olives. Mushrooms. The items listed above may not be a complete list of foods and beverages you can eat. Contact a dietitian for more information. What foods should I limit? This is a list of foods that should be eaten rarely or only on special occasions. Fruits Fruit canned in syrup. Vegetables Deep-fried potatoes (french fries). Grains Prepackaged pasta or rice dishes. Prepackaged cereal with added sugar. Prepackaged snacks with added sugar. Meats and other proteins Beef. Pork. Lamb. Poultry with skin. Hot dogs. Berniece Salines. Dairy Ice cream. Sour cream. Whole milk. Fats and oils Butter. Canola oil. Vegetable oil. Beef fat (tallow). Lard. Beverages Juice. Sugar-sweetened soft drinks. Beer. Liquor and spirits. Sweets and desserts Cookies. Cakes. Pies. Candy. Seasonings and condiments Mayonnaise. Pre-made sauces and marinades. The items listed above may not be a complete list of foods and beverages you should limit. Contact a dietitian for more information. Summary The Mediterranean diet includes  both food and lifestyle choices. Eat a variety of fresh fruits and vegetables, beans, nuts, seeds, and whole grains. Limit the amount of red meat  and sweets that you eat. If recommended by your health care provider, drink red wine in moderation. This means 1 glass a day for nonpregnant women and 2 glasses a day for men. A glass of wine equals 5 oz (150 mL). This information is not intended to replace advice given to you by your health care provider. Make sure you discuss any questions you have with your health care provider. Document Revised: 03/12/2019 Document Reviewed: 01/07/2019 Elsevier Patient Education  H[place]leigh, Finis Bud, NP  01/22/2022 8:56 AM    [place]

## 2022-01-21 NOTE — Patient Instructions (Addendum)
Medication Instructions:  Your physician recommends that you continue on your current medications as directed. Please refer to the Current Medication list given to you today.  Take Lopressor 100 mg 2 hours before cardiac CT  Labwork: BMET before Cardiac CT  Testing/Procedures: Your physician has requested that you have an echocardiogram in March 2024. Echocardiography is a painless test that uses sound waves to create images of your heart. It provides your doctor with information about the size and shape of your heart and how well your heart's chambers and valves are working. This procedure takes approximately one hour. There are no restrictions for this procedure. Please do NOT wear cologne, perfume, aftershave, or lotions (deodorant is allowed). Please arrive 15 minutes prior to your appointment time.   Follow-Up: 2 months  Any Other Special Instructions Will Be Listed Below (If Applicable).  If you need a refill on your cardiac medications before your next appointment, please call your pharmacy.      Your cardiac CT will be scheduled at one of the below locations:   [facility] [address] [place], [place] [address] [phone]  If scheduled at [facility], please arrive at the [facility] and Children's Entrance (Entrance C2) of [facility] 30 minutes prior to test start time. You can use the FREE valet parking offered at entrance C (encouraged to control the heart rate for the test)  Proceed to the [facility] Radiology Department (first floor) to check-in and test prep.  All radiology patients and guests should use entrance C2 at [facility], accessed from [facility], even though the hospital's physical address listed is [address].    Please follow these instructions carefully (unless otherwise directed): On the Night Before the Test: Be sure to Drink plenty of water. Do not consume any  caffeinated/decaffeinated beverages or chocolate 12 hours prior to your test. Do not take any antihistamines 12 hours prior to your test. On the Day of the Test: Drink plenty of water until 1 hour prior to the test. Do not eat any food 1 hour prior to test. You may take your regular medications prior to the test.  Take metoprolol (Lopressor) two hours prior to test. HOLD Furosemide/Hydrochlorothiazide morning of the test. FEMALES- please wear underwire-free bra if available, avoid dresses & tight clothing       After the Test: Drink plenty of water. After receiving IV contrast, you may experience a mild flushed feeling. This is normal. On occasion, you may experience a mild rash up to 24 hours after the test. This is not dangerous. If this occurs, you can take Benadryl 25 mg and increase your fluid intake. If you experience trouble breathing, this can be serious. If it is severe call 911 IMMEDIATELY. If it is mild, please call our office. If you take any of these medications: Glipizide/Metformin, Avandament, Glucavance, please do not take 48 hours after completing test unless otherwise instructed.  We will call to schedule your test 2-4 weeks out understanding that some insurance companies will need an authorization prior to the service being performed.   For non-scheduling related questions, please contact the cardiac imaging nurse navigator should you have any questions/concerns: Marchia Bond, Cardiac Imaging Nurse Navigator Gordy Clement, Cardiac Imaging Nurse Navigator [facility] Direct Office Dial: [phone]   For scheduling needs, including cancellations and rescheduling, please call [place], [phone].    Mediterranean Diet A Mediterranean diet refers to food and lifestyle  choices that are based on the traditions of countries located on the [employer]. It focuses on eating more fruits, vegetables, whole grains, beans, nuts, seeds, and  heart-healthy fats, and eating less dairy, meat, eggs, and processed foods with added sugar, salt, and fat. This way of eating has been shown to help prevent certain conditions and improve outcomes for people who have chronic diseases, like kidney disease and heart disease. What are tips for following this plan? Reading food labels Check the serving size of packaged foods. For foods such as rice and pasta, the serving size refers to the amount of cooked product, not dry. Check the total fat in packaged foods. Avoid foods that have saturated fat or trans fats. Check the ingredient list for added sugars, such as corn syrup. Shopping  Buy a variety of foods that offer a balanced diet, including: Fresh fruits and vegetables (produce). Grains, beans, nuts, and seeds. Some of these may be available in unpackaged forms or large amounts (in bulk). Fresh seafood. Poultry and eggs. Low-fat dairy products. Buy whole ingredients instead of prepackaged foods. Buy fresh fruits and vegetables in-season from local farmers markets. Buy plain frozen fruits and vegetables. If you do not have access to quality fresh seafood, buy precooked frozen shrimp or canned fish, such as tuna, salmon, or sardines. Stock your pantry so you always have certain foods on hand, such as olive oil, canned tuna, canned tomatoes, rice, pasta, and beans. Cooking Cook foods with extra-virgin olive oil instead of using butter or other vegetable oils. Have meat as a side dish, and have vegetables or grains as your main dish. This means having meat in small portions or adding small amounts of meat to foods like pasta or stew. Use beans or vegetables instead of meat in common dishes like chili or lasagna. Experiment with different cooking methods. Try roasting, broiling, steaming, and sauting vegetables. Add frozen vegetables to soups, stews, pasta, or rice. Add nuts or seeds for added healthy fats and plant protein at each meal. You  can add these to yogurt, salads, or vegetable dishes. Marinate fish or vegetables using olive oil, lemon juice, garlic, and fresh herbs. Meal planning Plan to eat one vegetarian meal one day each week. Try to work up to two vegetarian meals, if possible. Eat seafood two or more times a week. Have healthy snacks readily available, such as: Vegetable sticks with hummus. Greek yogurt. Fruit and nut trail mix. Eat balanced meals throughout the week. This includes: Fruit: 2-3 servings a day. Vegetables: 4-5 servings a day. Low-fat dairy: 2 servings a day. Fish, poultry, or lean meat: 1 serving a day. Beans and legumes: 2 or more servings a week. Nuts and seeds: 1-2 servings a day. Whole grains: 6-8 servings a day. Extra-virgin olive oil: 3-4 servings a day. Limit red meat and sweets to only a few servings a month. Lifestyle  Cook and eat meals together with your family, when possible. Drink enough fluid to keep your urine pale yellow. Be physically active every day. This includes: Aerobic exercise like running or swimming. Leisure activities like gardening, walking, or housework. Get 7-8 hours of sleep each night. If recommended by your health care provider, drink red wine in moderation. This means 1 glass a day for nonpregnant women and 2 glasses a day for men. A glass of wine equals 5 oz (150 mL). What foods should I eat? Fruits Apples. Apricots. Avocado. Berries. Bananas. Cherries. Dates. Figs. Grapes. Lemons. Melon. Oranges. Peaches. Plums. Pomegranate. Vegetables  Artichokes. Beets. Broccoli. Cabbage. Carrots. Eggplant. Green beans. Chard. Kale. Spinach. Onions. Leeks. Peas. Squash. Tomatoes. Peppers. Radishes. Grains Whole-grain pasta. Brown rice. Bulgur wheat. Polenta. Couscous. Whole-wheat bread. [place]. Meats and other proteins Beans. Almonds. Sunflower seeds. Pine nuts. Peanuts. Capitola. Salmon. Scallops. Shrimp. [place]. Tilapia. Clams. Oysters. Eggs. Poultry without  skin. Dairy Low-fat milk. Cheese. Greek yogurt. Fats and oils Extra-virgin olive oil. Avocado oil. Grapeseed oil. Beverages Water. Red wine. Herbal tea. Sweets and desserts Greek yogurt with honey. Baked apples. Poached pears. Trail mix. Seasonings and condiments Basil. Cilantro. Coriander. Cumin. Mint. Parsley. Sage. Rosemary. Tarragon. Garlic. Oregano. Thyme. Pepper. Balsamic vinegar. Tahini. Hummus. Tomato sauce. Olives. Mushrooms. The items listed above may not be a complete list of foods and beverages you can eat. Contact a dietitian for more information. What foods should I limit? This is a list of foods that should be eaten rarely or only on special occasions. Fruits Fruit canned in syrup. Vegetables Deep-fried potatoes (french fries). Grains Prepackaged pasta or rice dishes. Prepackaged cereal with added sugar. Prepackaged snacks with added sugar. Meats and other proteins Beef. Pork. Lamb. Poultry with skin. Hot dogs. Berniece Salines. Dairy Ice cream. Sour cream. Whole milk. Fats and oils Butter. Canola oil. Vegetable oil. Beef fat (tallow). Lard. Beverages Juice. Sugar-sweetened soft drinks. Beer. Liquor and spirits. Sweets and desserts Cookies. Cakes. Pies. Candy. Seasonings and condiments Mayonnaise. Pre-made sauces and marinades. The items listed above may not be a complete list of foods and beverages you should limit. Contact a dietitian for more information. Summary The Mediterranean diet includes both food and lifestyle choices. Eat a variety of fresh fruits and vegetables, beans, nuts, seeds, and whole grains. Limit the amount of red meat and sweets that you eat. If recommended by your health care provider, drink red wine in moderation. This means 1 glass a day for nonpregnant women and 2 glasses a day for men. A glass of wine equals 5 oz (150 mL). This information is not intended to replace advice given to you by your health care provider. Make sure you discuss any  questions you have with your health care provider. Document Revised: 03/12/2019 Document Reviewed: 01/07/2019 Elsevier Patient Education  [place].

## 2022-03-04 ENCOUNTER — Telehealth (HOSPITAL_COMMUNITY): Payer: Self-pay | Admitting: Emergency Medicine

## 2022-03-04 NOTE — Telephone Encounter (Signed)
Attempted to call patient regarding upcoming cardiac CT appointment. Left message on voicemail with name and callback number Marchia Bond RN Navigator [facility] [phone] Office [phone] Cell

## 2022-03-05 ENCOUNTER — Telehealth (HOSPITAL_COMMUNITY): Payer: Self-pay | Admitting: *Deleted

## 2022-03-05 NOTE — Telephone Encounter (Signed)
Reaching out to patient to offer assistance regarding upcoming cardiac imaging study; pt verbalizes understanding of appt date/time, parking situation and where to check in, pre-test NPO status and medications ordered, and verified current allergies; name and call back number provided for further questions should they arise  Gordy Clement RN Navigator Cardiac Imaging [facility] [phone] office [phone] cell  Patient to hold her Ziac and take '100mg'$  metoprolol tartrate two hours prior to her cardiac CT scan. She is aware to arrive at 9:30am.

## 2022-03-06 ENCOUNTER — Ambulatory Visit (HOSPITAL_COMMUNITY)
Admission: RE | Admit: 2022-03-06 | Discharge: 2022-03-06 | Disposition: A | Discharge status: Home / Self Care | Payer: Medicaid Other | Source: Ambulatory Visit | Attending: Nurse Practitioner | Admitting: Nurse Practitioner

## 2022-03-06 DIAGNOSIS — R079 Chest pain, unspecified: Secondary | ICD-10-CM

## 2022-03-06 MED ORDER — NITROGLYCERIN 0.4 MG SL SUBL
0.8000 mg | SUBLINGUAL_TABLET | SUBLINGUAL | Status: DC | PRN
  Administered 2022-03-06: 0.8 mg via SUBLINGUAL

## 2022-03-06 MED ORDER — IOHEXOL 350 MG/ML SOLN
100.0000 mL | Freq: Once | INTRAVENOUS | Status: AC | PRN
  Administered 2022-03-06: 100 mL via INTRAVENOUS

## 2022-03-06 MED ORDER — NITROGLYCERIN 0.4 MG SL SUBL
SUBLINGUAL_TABLET | SUBLINGUAL | Status: AC
  Filled 2022-03-06: qty 2

## 2022-03-25 ENCOUNTER — Ambulatory Visit: Payer: Medicaid Other | Admitting: Nurse Practitioner

## 2022-03-25 NOTE — Progress Notes (Deleted)
Cardiology Office Note:    Date:  01/21/2022  ID:  Rayburn Ma, DOB 08-Dec-1975, MRN [id number]  PCP:  Raiford Simmonds., [place] Providers Cardiologist:  Rozann Lesches, MD     Referring MD: Raiford Simmonds., PA-C   CC: Here for 1 year follow-up  History of Present Illness:    Kara Matthews is a 47 y.o. female with a hx of the following:  Hx of palpitations HTN HLD Fibromyalgia Mild to moderate AS   Patient is a 47 year old female with past medical history as mentioned above.  Previous cardiovascular history included Myoview that was overall low risk, no definite ischemia and normal LVEF noted.  Wore a 72-hour ZIO in 2020 and was overall in normal sinus rhythm, did have some brief episodes of SVT though was likely contributing to her palpitations, however these are not sustained and were considered benign arrhythmias.  Serial echocardiograms have been performed due to evidence of aortic stenosis seen on previous imaging studies.  Last evaluated by Dr. Domenic Polite 1 year ago.  She did note an episode of back and chest pain, 3 months prior to visit and attributed this to stress that she was working 3 jobs at the time.  Denied any exertional symptoms.  In 2021 she had an echocardiogram done that revealed suspected bicuspid aortic valve with mild to moderate AS.  Updated echocardiogram revealed EF 60 to 65%, mild to moderate aortic valve stenosis, mean gradient 18 mmHg, was recommended to follow-up in 1 year to monitor.   Today she presents for follow-up evaluation.  She states she is doing "pretty good."  Chief complaint is feeling more tired than usual for the past couple months, does admit to sharp chest and back pain intermittently, with exact same symptomatology as 1 year ago when she saw Dr. Domenic Polite, she states this is chronic and stable has been ongoing for many years. Does have intermittent palpitations occasionally. Denies any shortness of breath,  syncope, presyncope, dizziness, orthopnea, PND, swelling, acute bleeding, or claudication.  Tells me she is not the best at taking her blood pressure medications, she states she has not taken her BP meds in the past 3 days, says it is easy for her to forget.  Does admit to weight gain from 1 year ago, says she is not as active as she was previously.  She stays busy by providing care to a 47 year old.  Denies any other questions or concerns today.   Past Medical History:  Diagnosis Date   Anxiety    Aortic stenosis    Probable bicuspid aortic valve   Complication of anesthesia    Essential hypertension    Fibromyalgia    Hyperlipidemia     Past Surgical History:  Procedure Laterality Date   ABDOMINAL HYSTERECTOMY     CESAREAN SECTION     CHOLECYSTECTOMY     LIPOMA EXCISION Right 05/05/2020   Procedure: EXCISION LIPOMA, THIGH, 6CM;  Surgeon: Virl Cagey, MD;  Location: [facility];  Service: General;  Laterality: Right;    Current Medications: Current Meds  Medication Sig   bisoprolol-hydrochlorothiazide (ZIAC) 5-6.25 MG tablet Take 1 tablet by mouth in the morning and at bedtime.   cetirizine (ZYRTEC) 10 MG tablet Take 10 mg by mouth daily.   diphenhydramine-acetaminophen (TYLENOL PM) 25-500 MG TABS tablet Take 2 tablets by mouth at bedtime as needed (pain).   fluticasone (FLONASE) 50 MCG/ACT nasal spray Place 1 spray into both nostrils daily.  melatonin 5 MG TABS Take 10 mg by mouth at bedtime.   Multiple Vitamin (MULTIVITAMIN WITH MINERALS) TABS tablet Take 1 tablet by mouth 3 (three) times a week.   omeprazole (PRILOSEC) 20 MG capsule Take 20 mg by mouth daily.   PREMARIN 1.25 MG tablet TAKE 1 TABLET ONCE DAILY.   PREMARIN 1.25 MG tablet TAKE 1 TABLET ONCE DAILY.     Allergies:   Bactrim [sulfamethoxazole-trimethoprim], Ciprofloxacin, Codeine, and Flagyl [metronidazole]   Social History   Socioeconomic History   Marital status: Married    Spouse name: Not on file    Number of children: Not on file   Years of education: Not on file   Highest education level: Not on file  Occupational History   Not on file  Tobacco Use   Smoking status: Every Day    Packs/day: 1.00    Years: 20.00    Total pack years: 20.00    Types: Cigarettes   Smokeless tobacco: Never   Tobacco comments:    maybe less than pack per day   Vaping Use   Vaping Use: Never used  Substance and Sexual Activity   Alcohol use: No    Alcohol/week: 0.0 standard drinks of alcohol   Drug use: No   Sexual activity: Yes    Birth control/protection: Surgical    Comment: hyst  Other Topics Concern   Not on file  Social History Narrative   Not on file   Social Determinants of Health   Financial Resource Strain: Not on file  Food Insecurity: Not on file  Transportation Needs: Not on file  Physical Activity: Not on file  Stress: Not on file  Social Connections: Not on file     Family History: The patient's family history includes Heart failure in her brother; High blood pressure in her mother; Stroke in her father and mother.  ROS:   Review of Systems  Constitutional:  Positive for malaise/fatigue. Negative for chills, diaphoresis, fever and weight loss.  HENT: Negative.    Eyes: Negative.   Respiratory: Negative.    Cardiovascular:  Positive for chest pain and palpitations. Negative for orthopnea, claudication, leg swelling and PND.  Gastrointestinal: Negative.   Genitourinary: Negative.   Musculoskeletal: Negative.   Skin: Negative.   Neurological: Negative.   Endo/Heme/Allergies: Negative.   Psychiatric/Behavioral: Negative.      Please see the history of present illness.    All other systems reviewed and are negative.  EKGs/Labs/Other Studies Reviewed:    The following studies were reviewed today:   EKG:  EKG is ordered today.  The ekg ordered today demonstrates NSR, 71 bpm, without acute ischemic changes.   2D complete echocardiogram on May 01, 2021:  1.  Left ventricular ejection fraction, by estimation, is 60 to 65%. The  left ventricle has normal function. The left ventricle has no regional  wall motion abnormalities. There is moderate left ventricular hypertrophy.  Left ventricular diastolic  parameters were normal. The average left ventricular global longitudinal  strain is -22.0 %. The global longitudinal strain is normal.   2. Right ventricular systolic function is normal. The right ventricular  size is normal. Tricuspid regurgitation signal is inadequate for assessing  PA pressure.   3. The mitral valve is normal in structure. Mild mitral valve  regurgitation. No evidence of mitral stenosis.   4. The aortic valve has an indeterminant number of cusps. Aortic valve  regurgitation is not visualized. Mild to moderate aortic valve stenosis.  Aortic valve  mean gradient measures 18.0 mmHg. Aortic valve peak gradient  measures 32.1 mmHg. Aortic valve  area, by VTI measures 1.40 cm.   72-hour ZIO monitor on November 11, 2018: Predominant rhythm is sinus with heart rate ranging from 43 bpm up to 93 bpm and average heart rate 61 bpm. Brief episodes of SVT were noted, the longest lasting approximately 12 seconds and with heart rate ranging from 107-135 during these brief events. There were only rare PACs and PVCs otherwise. No sustained arrhythmias or pauses.  Myoview on August 29, 2017: There was no ST segment deviation noted during stress. The study is normal. Large moderate intensity fixed anterior defect with normal wall motion, consistent with breast attenuation though the defect is quite large. Based on body habitus (4'11, 181 lbs, BMI 37) this could explain the size of defect. There is clearly normal anterior wall motion and no reversibility. This is a low risk study. The left ventricular ejection fraction is hyperdynamic (>65%).  Recent Labs: No results found for requested labs within last 365 days.  Recent Lipid Panel No results  found for: "CHOL", "TRIG", "HDL", "CHOLHDL", "VLDL", "LDLCALC", "LDLDIRECT"       Physical Exam:    VS:  There were no vitals taken for this visit.    Wt Readings from Last 3 Encounters:  01/21/22 181 lb (82.1 kg)  02/05/21 174 lb 12.8 oz (79.3 kg)  08/31/20 173 lb (78.5 kg)     GEN: Obese, 47 year old female in no acute distress HEENT: Normal NECK: No JVD; No carotid bruits CARDIAC: S1/S2, RRR, no murmurs, rubs, gallops; 2+ peripheral pulses throughout, strong and equal bilaterally RESPIRATORY:  Clear and diminished to auscultation without rales, wheezing or rhonchi  ABDOMEN: Soft, non-tender, non-distended MUSCULOSKELETAL:  No edema; No deformity  SKIN: Warm and dry NEUROLOGIC:  Alert and oriented x 3 PSYCHIATRIC:  Normal affect   ASSESSMENT:    No diagnosis found.  PLAN:    In order of problems listed above:  Chest pain of uncertain etiology Still experiencing exact same symptoms she had with Dr. Harl Bowie 1 year ago, sharp chest pain along chest and back, typically not associated with exertion.  Symptoms have been chronic, stable for many years.  Stress test in 2019 was negative. EKG today did not reveal anything acute.  Discussed different options ischemic evaluation, and she is agreeable to undergo coronary CTA; however, she would like this scheduled in January.  Will prescribe one-time dose of metoprolol tartrate 100 mg to be taken 2 hours prior to testing. Will check BMET. ED precautions discussed. Continue current medication regimen. Heart healthy diet and regular cardiovascular exercise encouraged.   Mild to moderate AS Does experience fatigue, has been ongoing for the past several months, with intermittent sharp pain along chest and back with similar symptoms as noted 1 year ago, not associated with exertion.  Echocardiogram in March 2023 revealed stable mild to moderate aortic stenosis, mean gradient 18 mmHg.  It was recommended to follow-up in 1 year for monitoring.   Will arrange repeat TTE in March 2024 and arrange CCTA as mentioned above. ED precautions discussed.  Continue current medication regimen. Heart healthy diet and regular cardiovascular exercise encouraged.   Hx of palpitations Denies any recent or significant palpitations. Continue current medication regimen. Heart healthy diet and regular cardiovascular exercise encouraged.  Discussed to avoid caffeine and energy drinks.  HTN BP on arrival 136/86.  States she has not taken her BP medication this morning.  Discussed ways including setting  alarms on phone to remind herself to take her blood pressure medication. Continue Ziac. BP log given today and discussed to monitor BP at home at least 2 hours after medications and sitting for 5-10 minutes. Heart healthy diet and regular cardiovascular exercise encouraged.   HLD I do not have recent lab work on file.  She stated she will be going to the health department to get lab work drawn in the future.  I recommended that she obtain lipid panel and LFT at next visit at the health department.   5. Class 2 Obesity BMI today is 36.56. Weight loss via diet and exercise encouraged. Discussed the impact being overweight would have on cardiovascular risk. Heart healthy diet and regular cardiovascular exercise encouraged.   6. Disposition: Follow-up with me or Dr. Domenic Polite in 2 months or sooner if anything changes.      Medication Adjustments/Labs and Tests Ordered: Current medicines are reviewed at length with the patient today.  Concerns regarding medicines are outlined above.  No orders of the defined types were placed in this encounter.  No orders of the defined types were placed in this encounter.   There are no Patient Instructions on file for this visit.    Signed, Finis Bud, NP  03/25/2022 8:34 AM    [place]

## 2022-04-18 ENCOUNTER — Encounter: Payer: Self-pay | Admitting: Radiology

## 2022-04-22 ENCOUNTER — Encounter: Payer: Self-pay | Admitting: Nurse Practitioner

## 2022-04-22 ENCOUNTER — Ambulatory Visit (INDEPENDENT_AMBULATORY_CARE_PROVIDER_SITE_OTHER): Payer: Medicaid Other | Admitting: Nurse Practitioner

## 2022-04-22 ENCOUNTER — Ambulatory Visit: Payer: Medicaid Other | Attending: Nurse Practitioner

## 2022-04-22 VITALS — BP 115/72 | HR 62 | Ht 59.0 in | Wt 179.6 lb

## 2022-04-22 DIAGNOSIS — I350 Nonrheumatic aortic (valve) stenosis: Secondary | ICD-10-CM | POA: Diagnosis not present

## 2022-04-22 DIAGNOSIS — E785 Hyperlipidemia, unspecified: Secondary | ICD-10-CM

## 2022-04-22 DIAGNOSIS — I2510 Atherosclerotic heart disease of native coronary artery without angina pectoris: Secondary | ICD-10-CM | POA: Diagnosis not present

## 2022-04-22 DIAGNOSIS — I10 Essential (primary) hypertension: Secondary | ICD-10-CM | POA: Diagnosis not present

## 2022-04-22 DIAGNOSIS — Q231 Congenital insufficiency of aortic valve: Secondary | ICD-10-CM

## 2022-04-22 DIAGNOSIS — R002 Palpitations: Secondary | ICD-10-CM

## 2022-04-22 DIAGNOSIS — E669 Obesity, unspecified: Secondary | ICD-10-CM

## 2022-04-22 LAB — ECHOCARDIOGRAM COMPLETE
AR max vel: 1.15 cm2
AV Area VTI: 1.16 cm2
AV Area mean vel: 1.18 cm2
AV Mean grad: 21.3 mmHg
AV Peak grad: 35.8 mmHg
Ao pk vel: 2.99 m/s
Area-P 1/2: 4.57 cm2
Est EF: >75
MV M vel: 4.97 m/s
MV Peak grad: 98.8 mmHg
S' Lateral: 2.3 cm
Single Plane A2C EF: 72.8 %
Single Plane A4C EF: 68.8 %

## 2022-04-22 MED ORDER — ASPIRIN 81 MG PO TBEC: 81.0000 mg | DELAYED_RELEASE_TABLET | Freq: Every day | ORAL | 3 refills | Status: AC

## 2022-04-22 MED ORDER — ROSUVASTATIN CALCIUM 10 MG PO TABS: 10.0000 mg | ORAL_TABLET | Freq: Every day | ORAL | 2 refills | Status: DC

## 2022-04-22 NOTE — Progress Notes (Signed)
Cardiology Office Note:    Date:  04/22/2022  ID:  Kara Matthews, DOB Jul 11, 1975, MRN [id number]  PCP:  Raiford Simmonds., [place] [facility] Cardiologist:  Rozann Lesches, MD     Referring MD: Raiford Simmonds., PA-C   CC: Here for follow-up  History of Present Illness:    Kara Matthews is a 47 y.o. female with a hx of the following:  Minimal CAD Hx of palpitations HTN HLD Fibromyalgia Bicuspid aortic valve, AS  Patient is a delightful 46 year old female with past medical history as mentioned above.  Previous cardiovascular history includes Myoview that was low risk, no definite ischemia. 72-hour ZIO in 2020 and was overall in normal sinus rhythm, brief episodes of SVT thought was contributing to palpitations.    TTE in 2021 done that revealed suspected bicuspid aortic valve with mild to moderate AS.  Updated TTE revealed EF 60 to 65%, mild to moderate aortic valve stenosis, mean gradient 18 mmHg, was recommended to follow-up in 1 year to monitor. CCTA 02/2022 revealed coronary calcium score 5, nonobstructive mild CAD involving mid LAD, (0-24%) stenosis, bicuspid aortic valve with fusion of left and right cusp noted.  Echocardiogram performed today in office is pending.  Today she is doing well.  Does note palpitations "every now and then", lasting only few minutes in duration, not bothersome per her report.  Overall doing well from a cardiac perspective. Denies any chest pain, shortness of breath, syncope, presyncope, dizziness, orthopnea, PND, swelling or significant weight changes, acute bleeding, or claudication.  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    Past Medical History:  Diagnosis Date   Anxiety    Aortic stenosis    Probable bicuspid aortic valve   Complication of anesthesia    Essential hypertension    Fibromyalgia    Hyperlipidemia     Past Surgical History:  Procedure Laterality Date    ABDOMINAL HYSTERECTOMY     CESAREAN SECTION     CHOLECYSTECTOMY     LIPOMA EXCISION Right 05/05/2020   Procedure: EXCISION LIPOMA, THIGH, 6CM;  Surgeon: Virl Cagey, MD;  Location: [facility];  Service: General;  Laterality: Right;   Current Meds  Medication Sig   bisoprolol-hydrochlorothiazide (ZIAC) 5-6.25 MG tablet Take 1 tablet by mouth 2 (two) times daily.   cetirizine (ZYRTEC) 10 MG tablet Take 10 mg by mouth daily.   Cholecalciferol (VITAMIN D3) 125 MCG (5000 UT) capsule Take 5,000 Units by mouth daily.   Cyanocobalamin (VITAMIN B-12 PO) Take 1 tablet by mouth daily.   diphenhydramine-acetaminophen (TYLENOL PM) 25-500 MG TABS tablet Take 2 tablets by mouth at bedtime as needed (pain).   fluticasone (FLONASE) 50 MCG/ACT nasal spray Place 1 spray into both nostrils daily.   melatonin 5 MG TABS Take 10 mg by mouth at bedtime.   Multiple Vitamin (MULTIVITAMIN WITH MINERALS) TABS tablet Take 1 tablet by mouth 3 (three) times a week.   omeprazole (PRILOSEC) 20 MG capsule Take 20 mg by mouth daily.   PREMARIN 1.25 MG tablet TAKE 1 TABLET ONCE DAILY.   Allergies:   Bactrim [sulfamethoxazole-trimethoprim], Ciprofloxacin, Codeine, and Flagyl [metronidazole]   Social History   Socioeconomic History   Marital status: Married    Spouse name: Not on file   Number of children: Not on file   Years of education: Not on file   Highest education level: Not on file  Occupational History   Not on  file  Tobacco Use   Smoking status: Every Day    Packs/day: 1.00    Years: 20.00    Total pack years: 20.00    Types: Cigarettes   Smokeless tobacco: Never   Tobacco comments:    maybe less than pack per day   Vaping Use   Vaping Use: Never used  Substance and Sexual Activity   Alcohol use: No    Alcohol/week: 0.0 standard drinks of alcohol   Drug use: No   Sexual activity: Yes    Birth control/protection: Surgical    Comment: hyst  Other Topics Concern   Not on file  Social History  Narrative   Not on file   Social Determinants of Health   Financial Resource Strain: Not on file  Food Insecurity: Not on file  Transportation Needs: Not on file  Physical Activity: Not on file  Stress: Not on file  Social Connections: Not on file     Family History: The patient's family history includes Heart failure in her brother; High blood pressure in her mother; Stroke in her father and mother.  ROS:     Please see the history of present illness.    All other systems reviewed and are negative.  EKGs/Labs/Other Studies Reviewed:    The following studies were reviewed today:   EKG:  EKG is ordered today.  The ekg ordered today demonstrates NSR, 71 bpm, without acute ischemic changes.   Pending Echo today  CCTA on 03/06/2022: IMPRESSION: 1. Coronary calcium score of 5. This was 92nd percentile for age and sex matched control.   2. Normal coronary origin with left dominance.   3. Nonobstructive CAD with calcified plaque in mid LAD causing minimal (0-24%) stenosis   4. Bicuspid aortic valve with fusion of left and right cusps   CAD-RADS 1. Minimal non-obstructive CAD (0-24%). Consider non-atherosclerotic causes of chest pain. Consider preventive therapy and risk factor modification.  2D complete echocardiogram on May 01, 2021:  1. Left ventricular ejection fraction, by estimation, is 60 to 65%. The  left ventricle has normal function. The left ventricle has no regional  wall motion abnormalities. There is moderate left ventricular hypertrophy.  Left ventricular diastolic  parameters were normal. The average left ventricular global longitudinal  strain is -22.0 %. The global longitudinal strain is normal.   2. Right ventricular systolic function is normal. The right ventricular  size is normal. Tricuspid regurgitation signal is inadequate for assessing  PA pressure.   3. The mitral valve is normal in structure. Mild mitral valve  regurgitation. No evidence  of mitral stenosis.   4. The aortic valve has an indeterminant number of cusps. Aortic valve  regurgitation is not visualized. Mild to moderate aortic valve stenosis.  Aortic valve mean gradient measures 18.0 mmHg. Aortic valve peak gradient  measures 32.1 mmHg. Aortic valve  area, by VTI measures 1.40 cm.   72-hour ZIO monitor on November 11, 2018: Predominant rhythm is sinus with heart rate ranging from 43 bpm up to 93 bpm and average heart rate 61 bpm. Brief episodes of SVT were noted, the longest lasting approximately 12 seconds and with heart rate ranging from 107-135 during these brief events. There were only rare PACs and PVCs otherwise. No sustained arrhythmias or pauses.  Myoview on August 29, 2017: There was no ST segment deviation noted during stress. The study is normal. Large moderate intensity fixed anterior defect with normal wall motion, consistent with breast attenuation though the defect is quite  large. Based on body habitus (4'11, 181 lbs, BMI 37) this could explain the size of defect. There is clearly normal anterior wall motion and no reversibility. This is a low risk study. The left ventricular ejection fraction is hyperdynamic (>65%).  Recent Labs: No results found for requested labs within last 365 days.  Recent Lipid Panel No results found for: "CHOL", "TRIG", "HDL", "CHOLHDL", "VLDL", "LDLCALC", "LDLDIRECT"       Physical Exam:    VS:  BP 115/72   Pulse 62   Ht '4\' 11"'$  (1.499 m)   Wt 179 lb 9.6 oz (81.5 kg)   SpO2 96%   BMI 36.27 kg/m     Wt Readings from Last 3 Encounters:  04/22/22 179 lb 9.6 oz (81.5 kg)  01/21/22 181 lb (82.1 kg)  02/05/21 174 lb 12.8 oz (79.3 kg)     GEN: Obese, 47 year old female in no acute distress HEENT: Normal NECK: No JVD; No carotid bruits CARDIAC: S1/S2, RRR, no murmurs, rubs, gallops; 2+ peripheral pulses throughout RESPIRATORY:  Clear and diminished to auscultation without rales, wheezing or rhonchi  MUSCULOSKELETAL:   No edema; No deformity  SKIN: Warm and dry NEUROLOGIC:  Alert and oriented x 3 PSYCHIATRIC:  Normal affect   ASSESSMENT:    1. Atherosclerosis of native coronary artery of native heart without angina pectoris   2. Bicuspid aortic valve   3. Nonrheumatic aortic valve stenosis   4. Palpitations   5. Essential hypertension, benign   6. Hyperlipidemia, unspecified hyperlipidemia type   7. Obesity (BMI 30-39.9)     PLAN:    In order of problems listed above:  CAD Stable with no anginal symptoms. No indication for ischemic evaluation. Continue current medication regimen. Will start Aspirin 81 mg daily and Crestor 10 mg daily. Will place orders to repeat FLP and LFT in 2 months per protocol. Continue rest of medication regimen. No other medication changes. Heart healthy diet and regular cardiovascular exercise encouraged.   Bicuspid aortic valve, aortic valve stenosis Pending Echocardiogram performed today. TTE 04/2021 revealed stable mild to moderate aortic stenosis, mean gradient at 18 mmHg. ED precautions discussed.  Continue current medication regimen. Heart healthy diet and regular cardiovascular exercise encouraged.   Hx of palpitations Denies any recent or significant palpitations, not bothersome per her report. Continue current medication regimen. Heart healthy diet and regular cardiovascular exercise encouraged.  Discussed to avoid caffeine and energy drinks.  HTN BP stable. Continue Ziac. BP log given today and discussed to monitor BP at home at least 2 hours after medications and sitting for 5-10 minutes. Heart healthy diet and regular cardiovascular exercise encouraged.   HLD Recent labs faxed showed elevated LDL. Will start Crestor 10 mg daily and repeat FLP and LFT in 2 months per protocol.  Heart healthy diet and regular cardiovascular exercise encouraged.   5. Obesity BMI today is 36.27. Weight loss via diet and exercise encouraged. Discussed the impact being overweight  would have on cardiovascular risk. Heart healthy diet and regular cardiovascular exercise encouraged.   6. Disposition: Follow-up with me or Dr. Domenic Polite in 6 months or sooner if anything changes.      Medication Adjustments/Labs and Tests Ordered: Start Aspirin 81 mg daily.  Start Rosuvastatin 10 mg daily.  Continue rest of medication regimen.   Labwork: FLP, LFT in 2 months  Testing/Procedures: none  Follow-Up:  Your physician recommends that you schedule a follow-up appointment in: 6 months  Any Other Special Instructions Will Be Listed Below (If  Applicable).  If you need a refill on your cardiac medications before your next appointment, please call your pharmacy.    Signed, Finis Bud, NP  04/22/2022 4:46 PM    [place]

## 2022-04-22 NOTE — Patient Instructions (Addendum)
Medication Instructions:  Start Aspirin 81 mg daily.  Start Rosuvastatin 10 mg daily.  Continue rest of medication regimen.   Labwork: FLP, LFT in 2 months.  Testing/Procedures: none  Follow-Up:  Your physician recommends that you schedule a follow-up appointment in: 6 months  Any Other Special Instructions Will Be Listed Below (If Applicable).  If you need a refill on your cardiac medications before your next appointment, please call your pharmacy.

## 2022-04-29 ENCOUNTER — Telehealth: Payer: Self-pay | Admitting: Cardiology

## 2022-04-29 NOTE — Progress Notes (Signed)
Echo to be done in 1 year ordered and sent to [facility] for scheduling.

## 2022-04-29 NOTE — Telephone Encounter (Signed)
Patient returned CMA's call. 

## 2022-04-29 NOTE — Telephone Encounter (Signed)
See result note.  

## 2022-10-16 ENCOUNTER — Other Ambulatory Visit: Payer: Self-pay | Admitting: Obstetrics & Gynecology

## 2022-11-13 ENCOUNTER — Encounter: Payer: Self-pay | Admitting: Cardiology

## 2022-11-13 ENCOUNTER — Ambulatory Visit: Payer: Medicaid Other | Attending: Cardiology | Admitting: Cardiology

## 2022-11-13 VITALS — BP 122/80 | HR 72 | Ht 59.0 in | Wt 177.8 lb

## 2022-11-13 DIAGNOSIS — Q231 Congenital insufficiency of aortic valve: Secondary | ICD-10-CM | POA: Diagnosis not present

## 2022-11-13 DIAGNOSIS — I10 Essential (primary) hypertension: Secondary | ICD-10-CM

## 2022-11-13 DIAGNOSIS — E782 Mixed hyperlipidemia: Secondary | ICD-10-CM | POA: Diagnosis not present

## 2022-11-13 DIAGNOSIS — I350 Nonrheumatic aortic (valve) stenosis: Secondary | ICD-10-CM

## 2022-11-13 NOTE — Patient Instructions (Addendum)
Medication Instructions:  Your physician recommends that you continue on your current medications as directed. Please refer to the Current Medication list given to you today.  Labwork: none  Testing/Procedures: none  Follow-Up: Your physician recommends that you schedule a follow-up appointment in: March 2025 after echo.  Any Other Special Instructions Will Be Listed Below (If Applicable).  If you need a refill on your cardiac medications before your next appointment, please call your pharmacy.

## 2022-11-13 NOTE — Progress Notes (Signed)
Cardiology Office Note  Date: 11/13/2022   ID: BELLARAE RELEFORD, DOB 04/01/1975, MRN [id number]  History of Present Illness: Kara Matthews is a 47 y.o. female last seen in March by Ms. Peck NP, I reviewed the note (our last visit was in 2022).  She is here for a routine visit.  She does not report any progressive shortness of breath beyond NYHA class II, no palpitations or syncope.  Continues to experience an atypical, sharp, sudden chest pain that is sporadic and not suggestive of angina.  Echocardiogram from March is noted below.  She has a bicuspid aortic valve with overall moderate stenosis, mean AV gradient 21 mmHg.  We went over her medications, she continues on aspirin and Crestor.  Also Ziac for blood pressure control.  Her LDL was 76 in January.  Physical Exam: VS:  BP 122/80 (BP Location: Left Arm)   Pulse 72   Ht 4\' 11"  (1.499 m)   Wt 177 lb 12.8 oz (80.6 kg)   SpO2 98%   BMI 35.91 kg/m , BMI Body mass index is 35.91 kg/m.  Wt Readings from Last 3 Encounters:  11/13/22 177 lb 12.8 oz (80.6 kg)  04/22/22 179 lb 9.6 oz (81.5 kg)  01/21/22 181 lb (82.1 kg)    General: Patient appears comfortable at rest. HEENT: Conjunctiva and lids normal. Neck: Supple, no elevated JVP or carotid bruits. Lungs: Clear to auscultation, nonlabored breathing at rest. Cardiac: Regular rate and rhythm, no S3, 2/6 systolic murmur. Extremities: No pitting edema.  ECG:  An ECG dated 01/21/2022 was personally reviewed today and demonstrated:  Sinus rhythm.  Labwork:  January 2024: Cholesterol 142, triglycerides 240, HDL 33, LDL 76, BUN 13, creatinine 0.87, potassium 3.8  Other Studies Reviewed Today:  Echocardiogram 04/22/2022:  1. Left ventricular ejection fraction, by estimation, is >75%. The left  ventricle has hyperdynamic function. The left ventricle has no regional  wall motion abnormalities. Left ventricular diastolic parameters were  normal. The average left ventricular   global longitudinal strain is -24.7 %. The global longitudinal strain is  normal.   2. Right ventricular systolic function is normal. The right ventricular  size is normal. There is normal pulmonary artery systolic pressure. The  estimated right ventricular systolic pressure is 29.6 mmHg.   3. The mitral valve is normal in structure. Trivial mitral valve  regurgitation. No evidence of mitral stenosis.   4. The aortic valve is not well visualized but possibly bicuspid. Aortic  valve regurgitation is not visualized. Moderate aortic valve stenosis.  Aortic valve area, by VTI measures 1.16 cm. Aortic valve mean gradient  measures 21.2 mmHg. Aortic valve Vmax   measures 2.99 m/s.   5. The inferior vena cava is normal in size with greater than 50%  respiratory variability, suggesting right atrial pressure of 3 mmHg.   Assessment and Plan:  1.  Bicuspid aortic valve with moderate aortic stenosis by echocardiogram in March at which point mean AV gradient was 21 mmHg and dimensionless index 0.41.  No significant change in heart murmur.  Symptoms reviewed and stable, plan on a repeat echocardiogram in March of next year with clinical reevaluation.  2.  Minimal coronary atherosclerosis by chest CTA in January with calcium score of 5.  Continue aspirin and Crestor.  3.  Essential hypertension.  Blood pressure well-controlled today, continue Ziac.  4.  Mixed hyperlipidemia.  LDL 76 in January.  Continue Crestor.  Disposition:  Follow up  6 months.  Signed, Remi Deter  Franky Macho, M.D., F.A.C.C. [facility]

## 2023-04-29 ENCOUNTER — Ambulatory Visit: Payer: Medicaid Other | Attending: Nurse Practitioner

## 2023-04-29 DIAGNOSIS — I350 Nonrheumatic aortic (valve) stenosis: Secondary | ICD-10-CM | POA: Diagnosis not present

## 2023-04-29 LAB — ECHOCARDIOGRAM COMPLETE
AR max vel: 0.99 cm2
AV Area VTI: 1.08 cm2
AV Area mean vel: 1.08 cm2
AV Mean grad: 18.7 mmHg
AV Peak grad: 37.7 mmHg
Ao pk vel: 3.07 m/s
Area-P 1/2: 4.31 cm2
Calc EF: 70.5 %
MV VTI: 1.98 cm2
S' Lateral: 2.6 cm
Single Plane A2C EF: 63.5 %
Single Plane A4C EF: 75.2 %

## 2023-05-06 ENCOUNTER — Ambulatory Visit: Payer: Medicaid Other | Attending: Nurse Practitioner | Admitting: Nurse Practitioner

## 2023-05-06 ENCOUNTER — Encounter: Payer: Self-pay | Admitting: Nurse Practitioner

## 2023-05-06 ENCOUNTER — Telehealth: Payer: Self-pay | Admitting: Nurse Practitioner

## 2023-05-06 ENCOUNTER — Ambulatory Visit

## 2023-05-06 ENCOUNTER — Other Ambulatory Visit: Payer: Self-pay | Admitting: Nurse Practitioner

## 2023-05-06 VITALS — BP 122/80 | HR 62 | Ht 59.0 in | Wt 168.0 lb

## 2023-05-06 DIAGNOSIS — I350 Nonrheumatic aortic (valve) stenosis: Secondary | ICD-10-CM | POA: Diagnosis not present

## 2023-05-06 DIAGNOSIS — I2510 Atherosclerotic heart disease of native coronary artery without angina pectoris: Secondary | ICD-10-CM

## 2023-05-06 DIAGNOSIS — R42 Dizziness and giddiness: Secondary | ICD-10-CM

## 2023-05-06 DIAGNOSIS — E669 Obesity, unspecified: Secondary | ICD-10-CM

## 2023-05-06 DIAGNOSIS — Z131 Encounter for screening for diabetes mellitus: Secondary | ICD-10-CM

## 2023-05-06 DIAGNOSIS — H5712 Ocular pain, left eye: Secondary | ICD-10-CM

## 2023-05-06 DIAGNOSIS — R062 Wheezing: Secondary | ICD-10-CM

## 2023-05-06 DIAGNOSIS — I10 Essential (primary) hypertension: Secondary | ICD-10-CM

## 2023-05-06 DIAGNOSIS — Q2381 Bicuspid aortic valve: Secondary | ICD-10-CM

## 2023-05-06 DIAGNOSIS — E785 Hyperlipidemia, unspecified: Secondary | ICD-10-CM

## 2023-05-06 DIAGNOSIS — R002 Palpitations: Secondary | ICD-10-CM

## 2023-05-06 NOTE — Patient Instructions (Addendum)
 Medication Instructions:  Your physician recommends that you continue on your current medications as directed. Please refer to the Current Medication list given to you today.  Labwork: In 1-2 weeks Fasting at [facility]   Testing/Procedures: Your physician has requested that you have a carotid duplex. This test is an ultrasound of the carotid arteries in your neck. It looks at blood flow through these arteries that supply the brain with blood. Allow one hour for this exam. There are no restrictions or special instructions. ZIO- Long Term Monitor Instructions   Your physician has requested you wear your ZIO patch monitor 14 days.   This is a single patch monitor.  [employer] supplies one patch monitor per enrollment.  Additional stickers are not available.   Please do not apply patch if you will be having a Nuclear Stress Test, Echocardiogram, Cardiac CT, MRI, or Chest Xray during the time frame you would be wearing the monitor. The patch cannot be worn during these tests.  You cannot remove and re-apply the ZIO XT patch monitor.   Your ZIO patch monitor will be sent USPS Priority mail from [facility] directly to your home address. The monitor may also be mailed to a PO BOX if home delivery is not available.   It may take 3-5 days to receive your monitor after you have been enrolled.   Once you have received you monitor, please review enclosed instructions.  Your monitor has already been registered assigning a specific monitor serial # to you.   Applying the monitor   Shave hair from upper left chest.   Hold abrader disc by orange tab.  Rub abrader in 40 strokes over left upper chest as indicated in your monitor instructions.   Clean area with 4 enclosed alcohol pads .  Use all pads to assure are is cleaned thoroughly.  Let dry.   Apply patch as indicated in monitor instructions.  Patch will be place under collarbone on left side of chest with arrow pointing upward.   Rub patch  adhesive wings for 2 minutes.Remove white label marked "1".  Remove white label marked "2".  Rub patch adhesive wings for 2 additional minutes.   While looking in a mirror, press and release button in center of patch.  A small green light will flash 3-4 times .  This will be your only indicator the monitor has been turned on.     Do not shower for the first 24 hours.  You may shower after the first 24 hours.   Press button if you feel a symptom. You will hear a small click.  Record Date, Time and Symptom in the Patient Log Book.   When you are ready to remove patch, follow instructions on last 2 pages of Patient Log Book.  Stick patch monitor onto last page of Patient Log Book.   Place Patient Log Book in [place] box.  Use locking tab on box and tape box closed securely.  The [employer] and [employer] has [employer] on it.  Please place in mailbox as soon as possible.  Your physician should have your test results approximately 7 days after the monitor has been mailed back to [facility].   Call [facility] Customer Care at [phone] if you have questions regarding your ZIO XT patch monitor.  Call them immediately if you see an orange light blinking on your monitor.   If your monitor falls off in less than 4 days contact our Monitor department at [phone].  If your monitor becomes loose or falls off after 4 days call [employer] at [phone] for suggestions on securing your monitor.  Follow-Up: Your physician recommends that you schedule a follow-up appointment in: 6-8 weeks  2-3 week nurse visit for BP check   Any Other Special Instructions Will Be Listed Below (If Applicable).  If you need a refill on your cardiac medications before your next appointment, please call your pharmacy.

## 2023-05-06 NOTE — Telephone Encounter (Signed)
 Checking percert on the following     2week xt

## 2023-05-06 NOTE — Progress Notes (Unsigned)
 Cardiology Office Note:    Date:  04/22/2022  ID:  Kara Matthews, DOB April 24, 1975, MRN [id number]  PCP:  Tylene Fantasia., PA-C    [facility] Providers Cardiologist:  Nona Dell, MD     Referring MD: Tylene Fantasia., PA-C   CC: Here for follow-up  History of Present Illness:    Kara Matthews is a 48 y.o. female with a hx of the following:  Minimal CAD Hx of palpitations HTN HLD Fibromyalgia Bicuspid aortic valve, AS  Patient is a delightful 48 year old female with past medical history as mentioned above.  Previous cardiovascular history includes Myoview that was low risk, no definite ischemia. 72-hour ZIO in 2020 and was overall in normal sinus rhythm, brief episodes of SVT thought was contributing to palpitations.    TTE in 2021 done that revealed suspected bicuspid aortic valve with mild to moderate AS.  Updated TTE revealed EF 60 to 65%, mild to moderate aortic valve stenosis, mean gradient 18 mmHg, was recommended to follow-up in 1 year to monitor. CCTA 02/2022 revealed coronary calcium score 5, nonobstructive mild CAD involving mid LAD, (0-24%) stenosis, bicuspid aortic valve with fusion of left and right cusp noted.  Echocardiogram performed today in office is pending.  Today she is doing well.  Does note palpitations "every now and then", lasting only few minutes in duration, not bothersome per her report.  Overall doing well from a cardiac perspective. Denies any chest pain, shortness of breath, syncope, presyncope, dizziness, orthopnea, PND, swelling or significant weight changes, acute bleeding, or claudication.  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    Past Medical History:  Diagnosis Date   Anxiety    Aortic stenosis    Probable bicuspid aortic valve   Complication of anesthesia    Essential hypertension    Fibromyalgia    Hyperlipidemia     Past Surgical History:  Procedure Laterality Date    ABDOMINAL HYSTERECTOMY     CESAREAN SECTION     CHOLECYSTECTOMY     LIPOMA EXCISION Right 05/05/2020   Procedure: EXCISION LIPOMA, THIGH, 6CM;  Surgeon: Lucretia Roers, MD;  Location: [facility];  Service: General;  Laterality: Right;   Current Meds  Medication Sig   bisoprolol-hydrochlorothiazide (ZIAC) 5-6.25 MG tablet Take 1 tablet by mouth 2 (two) times daily.   cetirizine (ZYRTEC) 10 MG tablet Take 10 mg by mouth daily.   Cholecalciferol (VITAMIN D3) 125 MCG (5000 UT) capsule Take 5,000 Units by mouth daily.   Cyanocobalamin (VITAMIN B-12 PO) Take 1 tablet by mouth daily.   diphenhydramine-acetaminophen (TYLENOL PM) 25-500 MG TABS tablet Take 2 tablets by mouth at bedtime as needed (pain).   fluticasone (FLONASE) 50 MCG/ACT nasal spray Place 1 spray into both nostrils daily.   melatonin 5 MG TABS Take 10 mg by mouth at bedtime.   Multiple Vitamin (MULTIVITAMIN WITH MINERALS) TABS tablet Take 1 tablet by mouth 3 (three) times a week.   omeprazole (PRILOSEC) 20 MG capsule Take 20 mg by mouth daily.   PREMARIN 1.25 MG tablet TAKE 1 TABLET ONCE DAILY.   Allergies:   Bactrim [sulfamethoxazole-trimethoprim], Ciprofloxacin, Codeine, and Flagyl [metronidazole]   Social History   Socioeconomic History   Marital status: Married    Spouse name: Not on file   Number of children: Not on file   Years of education: Not on file   Highest education level: Not on file  Occupational History   Not on  file  Tobacco Use   Smoking status: Every Day    Current packs/day: 1.00    Average packs/day: 1 pack/day for 20.0 years (20.0 ttl pk-yrs)    Types: Cigarettes   Smokeless tobacco: Never   Tobacco comments:    maybe less than pack per day   Vaping Use   Vaping status: Never Used  Substance and Sexual Activity   Alcohol use: No    Alcohol/week: 0.0 standard drinks of alcohol   Drug use: No   Sexual activity: Yes    Birth control/protection: Surgical    Comment: hyst  Other Topics Concern    Not on file  Social History Narrative   Not on file   Social Drivers of Health   Financial Resource Strain: Not on file  Food Insecurity: Not on file  Transportation Needs: Not on file  Physical Activity: Not on file  Stress: Not on file  Social Connections: Not on file     Family History: The patient's family history includes Heart failure in her brother; High blood pressure in her mother; Stroke in her father and mother.  ROS:     Please see the history of present illness.    All other systems reviewed and are negative.  EKGs/Labs/Other Studies Reviewed:    The following studies were reviewed today:   EKG:  EKG is ordered today.  The ekg ordered today demonstrates NSR, 71 bpm, without acute ischemic changes.   Pending Echo today  CCTA on 03/06/2022: IMPRESSION: 1. Coronary calcium score of 5. This was 92nd percentile for age and sex matched control.   2. Normal coronary origin with left dominance.   3. Nonobstructive CAD with calcified plaque in mid LAD causing minimal (0-24%) stenosis   4. Bicuspid aortic valve with fusion of left and right cusps   CAD-RADS 1. Minimal non-obstructive CAD (0-24%). Consider non-atherosclerotic causes of chest pain. Consider preventive therapy and risk factor modification.  2D complete echocardiogram on May 01, 2021:  1. Left ventricular ejection fraction, by estimation, is 60 to 65%. The  left ventricle has normal function. The left ventricle has no regional  wall motion abnormalities. There is moderate left ventricular hypertrophy.  Left ventricular diastolic  parameters were normal. The average left ventricular global longitudinal  strain is -22.0 %. The global longitudinal strain is normal.   2. Right ventricular systolic function is normal. The right ventricular  size is normal. Tricuspid regurgitation signal is inadequate for assessing  PA pressure.   3. The mitral valve is normal in structure. Mild mitral valve   regurgitation. No evidence of mitral stenosis.   4. The aortic valve has an indeterminant number of cusps. Aortic valve  regurgitation is not visualized. Mild to moderate aortic valve stenosis.  Aortic valve mean gradient measures 18.0 mmHg. Aortic valve peak gradient  measures 32.1 mmHg. Aortic valve  area, by VTI measures 1.40 cm.   72-hour ZIO monitor on November 11, 2018: Predominant rhythm is sinus with heart rate ranging from 43 bpm up to 93 bpm and average heart rate 61 bpm. Brief episodes of SVT were noted, the longest lasting approximately 12 seconds and with heart rate ranging from 107-135 during these brief events. There were only rare PACs and PVCs otherwise. No sustained arrhythmias or pauses.  Myoview on August 29, 2017: There was no ST segment deviation noted during stress. The study is normal. Large moderate intensity fixed anterior defect with normal wall motion, consistent with breast attenuation though the defect  is quite large. Based on body habitus (4'11, 181 lbs, BMI 37) this could explain the size of defect. There is clearly normal anterior wall motion and no reversibility. This is a low risk study. The left ventricular ejection fraction is hyperdynamic (>65%).  Recent Labs: No results found for requested labs within last 365 days.  Recent Lipid Panel No results found for: "CHOL", "TRIG", "HDL", "CHOLHDL", "VLDL", "LDLCALC", "LDLDIRECT"       Physical Exam:    VS:  BP 122/80   Pulse 62   Ht 4\' 11"  (1.499 m)   Wt 168 lb (76.2 kg)   SpO2 98%   BMI 33.93 kg/m     Wt Readings from Last 3 Encounters:  05/06/23 168 lb (76.2 kg)  11/13/22 177 lb 12.8 oz (80.6 kg)  04/22/22 179 lb 9.6 oz (81.5 kg)     GEN: Obese, 48 year old female in no acute distress HEENT: Normal NECK: No JVD; No carotid bruits CARDIAC: S1/S2, RRR, no murmurs, rubs, gallops; 2+ peripheral pulses throughout RESPIRATORY:  Clear and diminished to auscultation without rales, wheezing or  rhonchi  MUSCULOSKELETAL:  No edema; No deformity  SKIN: Warm and dry NEUROLOGIC:  Alert and oriented x 3 PSYCHIATRIC:  Normal affect   ASSESSMENT:    1. Essential hypertension     PLAN:    In order of problems listed above:  CAD Stable with no anginal symptoms. No indication for ischemic evaluation. Continue current medication regimen. Will start Aspirin 81 mg daily and Crestor 10 mg daily. Will place orders to repeat FLP and LFT in 2 months per protocol. Continue rest of medication regimen. No other medication changes. Heart healthy diet and regular cardiovascular exercise encouraged.   Bicuspid aortic valve, aortic valve stenosis Pending Echocardiogram performed today. TTE 04/2021 revealed stable mild to moderate aortic stenosis, mean gradient at 18 mmHg. ED precautions discussed.  Continue current medication regimen. Heart healthy diet and regular cardiovascular exercise encouraged.   Hx of palpitations Denies any recent or significant palpitations, not bothersome per her report. Continue current medication regimen. Heart healthy diet and regular cardiovascular exercise encouraged.  Discussed to avoid caffeine and energy drinks.  HTN BP stable. Continue Ziac. BP log given today and discussed to monitor BP at home at least 2 hours after medications and sitting for 5-10 minutes. Heart healthy diet and regular cardiovascular exercise encouraged.   HLD Recent labs faxed showed elevated LDL. Will start Crestor 10 mg daily and repeat FLP and LFT in 2 months per protocol.  Heart healthy diet and regular cardiovascular exercise encouraged.   5. Obesity BMI today is 36.27. Weight loss via diet and exercise encouraged. Discussed the impact being overweight would have on cardiovascular risk. Heart healthy diet and regular cardiovascular exercise encouraged.   6. Disposition: Follow-up with me or Dr. Diona Browner in 6 months or sooner if anything changes.      Medication Adjustments/Labs and  Tests Ordered: Start Aspirin 81 mg daily.  Start Rosuvastatin 10 mg daily.  Continue rest of medication regimen.   Labwork: FLP, LFT in 2 months  Testing/Procedures: none  Follow-Up:  Your physician recommends that you schedule a follow-up appointment in: 6 months  Any Other Special Instructions Will Be Listed Below (If Applicable).  If you need a refill on your cardiac medications before your next appointment, please call your pharmacy.    Signed, Sharlene Dory, NP  05/06/2023 3:43 PM    [facility]

## 2023-05-15 ENCOUNTER — Other Ambulatory Visit (HOSPITAL_COMMUNITY)
Admission: RE | Admit: 2023-05-15 | Discharge: 2023-05-15 | Disposition: A | Discharge status: Home / Self Care | Source: Ambulatory Visit | Attending: Nurse Practitioner | Admitting: Nurse Practitioner

## 2023-05-15 DIAGNOSIS — R42 Dizziness and giddiness: Secondary | ICD-10-CM | POA: Diagnosis present

## 2023-05-15 DIAGNOSIS — I10 Essential (primary) hypertension: Secondary | ICD-10-CM | POA: Insufficient documentation

## 2023-05-15 DIAGNOSIS — Z131 Encounter for screening for diabetes mellitus: Secondary | ICD-10-CM | POA: Diagnosis present

## 2023-05-15 DIAGNOSIS — I2510 Atherosclerotic heart disease of native coronary artery without angina pectoris: Secondary | ICD-10-CM | POA: Diagnosis present

## 2023-05-15 LAB — CBC
HCT: 43.2 % (ref 36.0–46.0)
Hemoglobin: 14.2 g/dL (ref 12.0–15.0)
MCH: 30.6 pg (ref 26.0–34.0)
MCHC: 32.9 g/dL (ref 30.0–36.0)
MCV: 93.1 fL (ref 80.0–100.0)
Platelets: 195 10*3/uL (ref 150–400)
RBC: 4.64 MIL/uL (ref 3.87–5.11)
RDW: 12.6 % (ref 11.5–15.5)
WBC: 6.4 10*3/uL (ref 4.0–10.5)
nRBC: 0 % (ref 0.0–0.2)

## 2023-05-15 LAB — COMPREHENSIVE METABOLIC PANEL WITH GFR
ALT: 24 U/L (ref 0–44)
AST: 32 U/L (ref 15–41)
Albumin: 3.9 g/dL (ref 3.5–5.0)
Alkaline Phosphatase: 59 U/L (ref 38–126)
Anion gap: 12 (ref 5–15)
BUN: 11 mg/dL (ref 6–20)
CO2: 23 mmol/L (ref 22–32)
Calcium: 9.2 mg/dL (ref 8.9–10.3)
Chloride: 102 mmol/L (ref 98–111)
Creatinine, Ser: 0.72 mg/dL (ref 0.44–1.00)
GFR, Estimated: >60 mL/min (ref >60)
Glucose, Bld: 92 mg/dL (ref 70–99)
Potassium: 3.7 mmol/L (ref 3.5–5.1)
Sodium: 137 mmol/L (ref 135–145)
Total Bilirubin: 0.5 mg/dL (ref 0.0–1.2)
Total Protein: 7.1 g/dL (ref 6.5–8.1)

## 2023-05-15 LAB — HEMOGLOBIN A1C
Hgb A1c MFr Bld: 5.2 % (ref 4.8–5.6)
Mean Plasma Glucose: 102.54 mg/dL

## 2023-05-15 LAB — LIPID PANEL
Cholesterol: 157 mg/dL (ref 0–200)
HDL: 31 mg/dL — ABNORMAL LOW (ref >40)
LDL Cholesterol: 83 mg/dL (ref 0–99)
Total CHOL/HDL Ratio: 5.1 ratio
Triglycerides: 214 mg/dL — ABNORMAL HIGH (ref <150)
VLDL: 43 mg/dL — ABNORMAL HIGH (ref 0–40)

## 2023-05-15 LAB — MAGNESIUM: Magnesium: 1.8 mg/dL (ref 1.7–2.4)

## 2023-05-16 LAB — THYROID PANEL WITH TSH
Free Thyroxine Index: 1.7 (ref 1.2–4.9)
T3 Uptake Ratio: 22 % — ABNORMAL LOW (ref 24–39)
T4, Total: 7.7 ug/dL (ref 4.5–12.0)
TSH: 2.42 u[IU]/mL (ref 0.450–4.500)

## 2023-05-21 ENCOUNTER — Ambulatory Visit: Attending: Nurse Practitioner

## 2023-05-21 DIAGNOSIS — I2510 Atherosclerotic heart disease of native coronary artery without angina pectoris: Secondary | ICD-10-CM | POA: Diagnosis not present

## 2023-05-21 DIAGNOSIS — R42 Dizziness and giddiness: Secondary | ICD-10-CM

## 2023-05-21 DIAGNOSIS — I6523 Occlusion and stenosis of bilateral carotid arteries: Secondary | ICD-10-CM

## 2023-05-21 DIAGNOSIS — H5712 Ocular pain, left eye: Secondary | ICD-10-CM

## 2023-05-26 ENCOUNTER — Telehealth: Payer: Self-pay | Admitting: Cardiology

## 2023-05-26 NOTE — Telephone Encounter (Signed)
 Pt would like a c/b regarding later appt time for tomorrow's Nurse Visit. Please advise

## 2023-05-27 ENCOUNTER — Ambulatory Visit: Attending: Internal Medicine

## 2023-05-27 VITALS — BP 134/78 | HR 56 | Ht 59.0 in

## 2023-05-27 DIAGNOSIS — I10 Essential (primary) hypertension: Secondary | ICD-10-CM

## 2023-05-27 NOTE — Progress Notes (Signed)
 Patient here for BP check. Stated that she has been having headache pain level 5. Checked her BP this morning 185/85 after she ate breakfast stated that she waiting at least 2 hours to check. Ate 2 spoon fulls of mustard to get it to come down came down to 150/70ish really didn't remember the bottom number. Stated that she was traveling today and the last reading was when she got back and took her second BP medication. But was about 30 minutes after she got back. Advised her that its best to check when she takes 2 hours after and is resting. Doesn't sleep at night stays up late as well and is back up early in the morning on go. She mentioned that she is starting a new job tomorrow as well. Went over lab results. Carotid hasn't been resulted yet. Let her know will send to provider.

## 2023-05-27 NOTE — Progress Notes (Signed)
 Thank you for seeing her.  She does admit to multiple components of why her BP has been elevated recently.  Stress we will do it, and I believe certain stressors for her are contributing to this.  Hesitant to adjust her medicine at this current time with her current HR.  Recommend she follows up with her PCP in the meantime.  I just sent over my result note not too long ago.  Her carotid Dopplers overall looked good just some mild blockages along bilateral ICAs.  Continue current treatment plan.  Continue to monitor BP and I will reevaluate this at next office visit.  Sharlene Dory, NP

## 2023-05-28 ENCOUNTER — Telehealth: Payer: Self-pay

## 2023-05-28 NOTE — Telephone Encounter (Signed)
 Per Lanora Manis after nurse visit   Thank you for seeing her.  She does admit to multiple components of why her BP has been elevated recently.  Stress we will do it, and I believe certain stressors for her are contributing to this.  Hesitant to adjust her medicine at this current time with her current HR.  Recommend she follows up with her PCP in the meantime.  I just sent over my result note not too long ago.  Her carotid Dopplers overall looked good just some mild blockages along bilateral ICAs.  Continue current treatment plan.  Continue to monitor BP and I will reevaluate this at next office visit.   Sharlene Dory, NP    Patient verbalized understanding

## 2023-05-29 DIAGNOSIS — R002 Palpitations: Secondary | ICD-10-CM

## 2023-06-24 ENCOUNTER — Ambulatory Visit: Attending: Nurse Practitioner | Admitting: Nurse Practitioner

## 2023-06-24 ENCOUNTER — Encounter: Payer: Self-pay | Admitting: Nurse Practitioner

## 2023-06-24 VITALS — BP 129/81 | HR 79 | Ht 59.0 in | Wt 173.0 lb

## 2023-06-24 DIAGNOSIS — I2510 Atherosclerotic heart disease of native coronary artery without angina pectoris: Secondary | ICD-10-CM | POA: Diagnosis present

## 2023-06-24 DIAGNOSIS — I10 Essential (primary) hypertension: Secondary | ICD-10-CM | POA: Diagnosis present

## 2023-06-24 DIAGNOSIS — I350 Nonrheumatic aortic (valve) stenosis: Secondary | ICD-10-CM | POA: Diagnosis present

## 2023-06-24 DIAGNOSIS — E785 Hyperlipidemia, unspecified: Secondary | ICD-10-CM | POA: Diagnosis present

## 2023-06-24 DIAGNOSIS — E669 Obesity, unspecified: Secondary | ICD-10-CM

## 2023-06-24 DIAGNOSIS — Q2381 Bicuspid aortic valve: Secondary | ICD-10-CM

## 2023-06-24 MED ORDER — ROSUVASTATIN CALCIUM 5 MG PO TABS: 5.0000 mg | ORAL_TABLET | Freq: Every day | ORAL | 7 refills | Status: AC

## 2023-06-24 NOTE — Progress Notes (Signed)
 Cardiology Office Note:    Date:  06/24/2023 ID:  Kara Matthews, DOB 1975/12/13, MRN [id number] PCP:  Bolivar Bushman., PA-C [facility] Providers Cardiologist:  Teddie Favre, MD    Referring MD: Bolivar Bushman., PA-C   CC: Here for follow-up  History of Present Illness:    Kara Matthews is a 48 y.o. female with a PMH of bicuspid aortic valve resulting in aortic valve stenosis, minimal CAD, palpitations, hypertension, hyperlipidemia, fibromyalgia, who presents today for scheduled follow-up.   Previous cardiovascular history includes Myoview  that was low risk, no definite ischemia. 72-hour ZIO in 2020 and was overall in normal sinus rhythm, brief episodes of SVT thought was contributing to palpitations.    CCTA 02/2022 revealed coronary calcium  score 5, nonobstructive mild CAD involving mid LAD, (0-24%) stenosis, bicuspid aortic valve with fusion of left and right cusp noted.    TTE 04/2022 revealed normal EF, moderate aortic valve stenosis with mean gradient 21.2 mmHg.   Last seen by Dr. Londa Rival on November 13, 2022.  Patient noted atypical, sharp sudden chest pain that was sporadic. Not felt to be cardiac related.  See updated echocardiogram noted below from March 2025.  05/06/2023 - Today she presents for scheduled follow-up.  She admits to episode of chest pain yesterday GERD on the left side, sometimes noted to be on the right side.  Admits to blood pressure issues recently that have resolved, says she is not sure where this has been due to.  Does admit to some dizziness that is bothersome for her, has been noted when after using the restroom.  Had an episode last week where she was sick afterwards, was dizzy, felt like the room was spinning, says she had a call out of work.  Does admit to sensation of left eye feeling like it is "on fire" denies any headache, says it will last on her left side for about 10 minutes, then go away, says she cannot seem to move during  these episodes.  Denies any strokelike symptoms. Denies any shortness of breath, palpitations, syncope, presyncope, orthopnea, PND, swelling or significant weight changes, acute bleeding, or claudication.  06/24/2023 - Here for follow-up. Doing much better since I last saw her. Symptoms have resolved.  Says that she is no longer taking rosuvastatin  as she thought this medicine made her sick.  Also confirms to me she is not taking aspirin  81 mg daily. Attributes her past symptoms to stress. Denies any chest pain, shortness of breath, palpitations, syncope, presyncope, dizziness, orthopnea, PND, swelling or significant weight changes, acute bleeding, or claudication.  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    ROS:   Please see the history of present illness.    All other systems reviewed and are negative.  EKGs/Labs/Other Studies Reviewed:    The following studies were reviewed today:   EKG:   EKG is not ordered today.  Cardiac monitor 05/2023:  ZIO monitor reviewed.  12 days, 9 hours analyzed.   Predominant rhythm is sinus with heart rate ranging from 47 bpm up to 120 bpm and average heart rate 65 bpm. There were rare PACs including atrial couplets and triplets representing less than 1% total beats. There were rare PVCs representing less than 1% total beats. Eight brief episodes of PSVT were noted, the longest of which lasted 12 beats.  No sustained arrhythmias. No pauses or high degree heart block.  Carotid duplex 05/2023:  Summary:  Right Carotid:  Velocities in the right ICA are consistent with a 1-39%  stenosis. Non-hemodynamically significant plaque <50% noted in the  CCA. The ECA appears <50% stenosed.   Left Carotid: Velocities in the left ICA are consistent with a 1-39%  stenosis. Non-hemodynamically significant plaque <50% noted in the  CCA. The ECA appears <50% stenosed.   Vertebrals:  Bilateral vertebral arteries demonstrate antegrade flow.   Subclavians: Normal flow hemodynamics were seen in bilateral subclavian arteries.   *See table(s) above for measurements and observations.  Suggest follow up study in 12 months.  Echo 04/2023:  1. Left ventricular ejection fraction, by estimation, is 60 to 65%. Left  ventricular ejection fraction by 3D volume is 61 %. The left ventricle has  normal function. The left ventricle has no regional wall motion  abnormalities. Left ventricular diastolic   parameters were normal. The average left ventricular global longitudinal  strain is -17.1 %. The global longitudinal strain is normal.   2. Right ventricular systolic function is normal. The right ventricular  size is normal. There is normal pulmonary artery systolic pressure.   3. The mitral valve is normal in structure. Mild mitral valve  regurgitation. No evidence of mitral stenosis.   4. The aortic valve is bicuspid. Aortic valve regurgitation is not  visualized. Moderate aortic valve stenosis. Aortic valve area, by VTI  measures 1.08 cm. Aortic valve mean gradient measures 18.7 mmHg. Aortic  valve Vmax measures 3.07 m/s.   5. The inferior vena cava is normal in size with greater than 50%  respiratory variability, suggesting right atrial pressure of 3 mmHg.   Comparison(s): A prior study was performed on 04/22/2022. No significant  change from prior study.  CCTA on 03/06/2022: IMPRESSION: 1. Coronary calcium  score of 5. This was 92nd percentile for age and sex matched control.   2. Normal coronary origin with left dominance.   3. Nonobstructive CAD with calcified plaque in mid LAD causing minimal (0-24%) stenosis   4. Bicuspid aortic valve with fusion of left and right cusps   CAD-RADS 1. Minimal non-obstructive CAD (0-24%). Consider non-atherosclerotic causes of chest pain. Consider preventive therapy and risk factor modification.  2D complete echocardiogram on May 01, 2021:  1. Left ventricular ejection fraction, by  estimation, is 60 to 65%. The  left ventricle has normal function. The left ventricle has no regional  wall motion abnormalities. There is moderate left ventricular hypertrophy.  Left ventricular diastolic  parameters were normal. The average left ventricular global longitudinal  strain is -22.0 %. The global longitudinal strain is normal.   2. Right ventricular systolic function is normal. The right ventricular  size is normal. Tricuspid regurgitation signal is inadequate for assessing  PA pressure.   3. The mitral valve is normal in structure. Mild mitral valve  regurgitation. No evidence of mitral stenosis.   4. The aortic valve has an indeterminant number of cusps. Aortic valve  regurgitation is not visualized. Mild to moderate aortic valve stenosis.  Aortic valve mean gradient measures 18.0 mmHg. Aortic valve peak gradient  measures 32.1 mmHg. Aortic valve  area, by VTI measures 1.40 cm.   72-hour ZIO monitor on November 11, 2018: Predominant rhythm is sinus with heart rate ranging from 43 bpm up to 93 bpm and average heart rate 61 bpm. Brief episodes of SVT were noted, the longest lasting approximately 12 seconds and with heart rate ranging from 107-135 during these brief events. There were only rare PACs and PVCs otherwise. No sustained arrhythmias or pauses.  Myoview  on August 29, 2017: There was no ST segment deviation noted during stress. The study is normal. Large moderate intensity fixed anterior defect with normal wall motion, consistent with breast attenuation though the defect is quite large. Based on body habitus (4'11, 181 lbs, BMI 37) this could explain the size of defect. There is clearly normal anterior wall motion and no reversibility. This is a low risk study. The left ventricular ejection fraction is hyperdynamic (>65%).  Physical Exam:    VS:  BP 129/81 (BP Location: Left Arm)   Pulse 79   Ht 4\' 11"  (1.499 m)   Wt 173 lb (78.5 kg)   SpO2 97%   BMI 34.94 kg/m      Wt Readings from Last 3 Encounters:  06/24/23 173 lb (78.5 kg)  05/06/23 168 lb (76.2 kg)  11/13/22 177 lb 12.8 oz (80.6 kg)   GEN: Obese, 48 year old female in no acute distress HEENT: Normal NECK: No JVD; No carotid bruits CARDIAC: S1/S2, RRR, Grade 2/6 murmur, no rubs, no gallops; 2+ peripheral pulses throughout RESPIRATORY:  Clear and diminished to auscultation without rales or rhonchi or wheezing MUSCULOSKELETAL:  No edema; No deformity  SKIN: Warm and dry NEUROLOGIC:  Alert and oriented x 3 PSYCHIATRIC:  Normal affect   ASSESSMENT & PLAN:    In order of problems listed above: CAD Stable with no anginal symptoms. No indication for ischemic evaluation. Will restart Crestor  5 mg daily and repeat FLP/LFT in 2-3 months. Instructed her to restart Aspirin  81 mg daily. Continue rest of medication regimen. Heart healthy diet and regular cardiovascular exercise encouraged.  Care and ED precautions discussed.  2. Bicuspid aortic valve, aortic valve stenosis Most recent Echo showed bicuspid aortic valve with moderate aortic valve stenosis, mean gradient measuring 18.7 mmHg, stable from prior study 1 year ago.  Asymptomatic. Will continue to monitor.  ED precautions discussed.  Continue current medication regimen. Heart healthy diet and regular cardiovascular exercise encouraged.  Care and ED precautions discussed.  3. HTN BP on recheck stable. Continue Ziac. Discussed to monitor BP at home at least 2 hours after medications and sitting for 5-10 minutes.   Heart healthy diet and regular cardiovascular exercise encouraged.   4. HLD Most recent LDL 83.  Restarting Crestor  at 5 mg daily.   Heart healthy diet and regular cardiovascular exercise encouraged. Will obtain FLP/LFT in 2-3 months.   5. Obesity  Weight loss via diet and exercise encouraged. Discussed the impact being overweight would have on cardiovascular risk. Heart healthy diet and regular cardiovascular exercise encouraged.    Disposition: Follow-up with me or Dr. Londa Rival or APP in 6 months or sooner if anything changes.     If you need a refill on your cardiac medications before your next appointment, please call your pharmacy.  Signed, Lasalle Pointer, NP

## 2023-06-24 NOTE — Patient Instructions (Addendum)
 Medication Instructions:  Your physician has recommended you make the following change in your medication:  Please restart your Aspirin   Please Start Rosuvastatin  5 Mg daily   Labwork: In 3 months at [facility]   Testing/Procedures: None   Follow-Up: Your physician recommends that you schedule a follow-up appointment in: 6 months   Any Other Special Instructions Will Be Listed Below (If Applicable).  If you need a refill on your cardiac medications before your next appointment, please call your pharmacy.

## 2023-10-10 ENCOUNTER — Encounter: Payer: Self-pay | Admitting: Radiology

## 2023-10-30 ENCOUNTER — Other Ambulatory Visit (HOSPITAL_COMMUNITY): Payer: Self-pay | Admitting: Physician Assistant

## 2023-10-30 DIAGNOSIS — Z1231 Encounter for screening mammogram for malignant neoplasm of breast: Secondary | ICD-10-CM

## 2023-10-31 ENCOUNTER — Ambulatory Visit (HOSPITAL_COMMUNITY)
Admission: RE | Admit: 2023-10-31 | Discharge: 2023-10-31 | Disposition: A | Discharge status: Home / Self Care | Source: Ambulatory Visit | Attending: Physician Assistant | Admitting: Physician Assistant

## 2023-10-31 DIAGNOSIS — Z1231 Encounter for screening mammogram for malignant neoplasm of breast: Secondary | ICD-10-CM | POA: Insufficient documentation

## 2023-12-01 ENCOUNTER — Other Ambulatory Visit: Payer: Self-pay | Admitting: Obstetrics & Gynecology

## 2023-12-05 ENCOUNTER — Other Ambulatory Visit: Payer: Self-pay | Admitting: Obstetrics & Gynecology

## 2023-12-08 ENCOUNTER — Ambulatory Visit: Admitting: Obstetrics & Gynecology

## 2023-12-08 ENCOUNTER — Encounter: Payer: Self-pay | Admitting: Obstetrics & Gynecology

## 2023-12-08 VITALS — BP 120/79 | HR 86 | Ht 59.0 in | Wt 167.0 lb

## 2023-12-08 DIAGNOSIS — N952 Postmenopausal atrophic vaginitis: Secondary | ICD-10-CM

## 2023-12-08 DIAGNOSIS — Z01419 Encounter for gynecological examination (general) (routine) without abnormal findings: Secondary | ICD-10-CM | POA: Diagnosis not present

## 2023-12-08 DIAGNOSIS — N951 Menopausal and female climacteric states: Secondary | ICD-10-CM

## 2023-12-08 MED ORDER — ESTROGENS CONJUGATED 1.25 MG PO TABS: 1.2500 mg | ORAL_TABLET | Freq: Every day | ORAL | 11 refills | Status: AC

## 2023-12-08 MED ORDER — ESTROGENS CONJUGATED 0.625 MG/GM VA CREA: 1.0000 | TOPICAL_CREAM | Freq: Every day | VAGINAL | 11 refills | Status: AC

## 2023-12-08 NOTE — Progress Notes (Signed)
 Subjective:     Kara Matthews is a 48 y.o. female here for a routine exam.  No LMP recorded. Patient has had a hysterectomy. [id number] Birth Control Method:  hysterectomy Menstrual Calendar(currently): na  Current complaints: vaginal dryness.   Current acute medical issues: None   Recent Gynecologic History No LMP recorded. Patient has had a hysterectomy. Last Pap: Not applicable   Last mammogram: September 2025, normal  Past Medical History:  Diagnosis Date   Anxiety    Aortic stenosis    Probable bicuspid aortic valve   Complication of anesthesia    Essential hypertension    Fibromyalgia    Hyperlipidemia     Past Surgical History:  Procedure Laterality Date   ABDOMINAL HYSTERECTOMY     CESAREAN SECTION     CHOLECYSTECTOMY     LIPOMA EXCISION Right 05/05/2020   Procedure: EXCISION LIPOMA, THIGH, 6CM;  Surgeon: Kallie Manuelita BROCKS, MD;  Location: [facility];  Service: General;  Laterality: Right;    OB History     Gravida  2   Para  2   Term  2   Preterm      AB      Living  2      SAB      IAB      Ectopic      Multiple      Live Births  2           Social History   Socioeconomic History   Marital status: Married    Spouse name: Not on file   Number of children: Not on file   Years of education: Not on file   Highest education level: Not on file  Occupational History   Not on file  Tobacco Use   Smoking status: Every Day    Current packs/day: 1.00    Average packs/day: 1 pack/day for 20.0 years (20.0 ttl pk-yrs)    Types: Cigarettes   Smokeless tobacco: Never   Tobacco comments:    maybe less than pack per day   Vaping Use   Vaping status: Never Used  Substance and Sexual Activity   Alcohol use: No    Alcohol/week: 0.0 standard drinks of alcohol   Drug use: No   Sexual activity: Yes    Birth control/protection: Surgical    Comment: hyst  Other Topics Concern   Not on file  Social History Narrative   Not on file   Social  Drivers of Health   Financial Resource Strain: Not on file  Food Insecurity: Not on file  Transportation Needs: Not on file  Physical Activity: Not on file  Stress: Not on file  Social Connections: Not on file    Family History  Problem Relation Age of Onset   Stroke Father    Heart failure Brother    Stroke Mother    High blood pressure Mother      Current Outpatient Medications:    aspirin  EC 81 MG tablet, Take 1 tablet (81 mg total) by mouth daily. Swallow whole., Disp: 90 tablet, Rfl: 3   chlorthalidone (HYGROTON) 25 MG tablet, Take 25 mg by mouth daily., Disp: , Rfl:    conjugated estrogens  (PREMARIN ) vaginal cream, Place 1 Applicatorful vaginally at bedtime., Disp: 30 g, Rfl: 11   Cyanocobalamin (VITAMIN B-12 PO), Take 1 tablet by mouth daily., Disp: , Rfl:    fluticasone (FLONASE) 50 MCG/ACT nasal spray, Place 1 spray into both nostrils daily., Disp: , Rfl:  gabapentin  (NEURONTIN ) 100 MG capsule, Take 100 mg by mouth at bedtime., Disp: , Rfl:    loratadine  (CLARITIN ) 10 MG tablet, Take 10 mg by mouth daily., Disp: , Rfl:    losartan (COZAAR) 50 MG tablet, Take 50 mg by mouth daily., Disp: , Rfl:    melatonin 5 MG TABS, Take 10 mg by mouth at bedtime., Disp: , Rfl:    Multiple Vitamin (MULTIVITAMIN WITH MINERALS) TABS tablet, Take 1 tablet by mouth 3 (three) times a week., Disp: , Rfl:    omeprazole (PRILOSEC) 20 MG capsule, Take 20 mg by mouth daily., Disp: , Rfl:    diphenhydramine-acetaminophen  (TYLENOL  PM) 25-500 MG TABS tablet, Take 2 tablets by mouth at bedtime as needed (pain)., Disp: , Rfl:    estrogens , conjugated, (PREMARIN ) 1.25 MG tablet, Take 1 tablet (1.25 mg total) by mouth daily., Disp: 30 tablet, Rfl: 11   rosuvastatin  (CRESTOR ) 5 MG tablet, Take 1 tablet (5 mg total) by mouth daily., Disp: 30 tablet, Rfl: 7  Review of Systems  Review of Systems  Constitutional: Negative for fever, chills, weight loss, malaise/fatigue and diaphoresis.  HENT: Negative  for hearing loss, ear pain, nosebleeds, congestion, sore throat, neck pain, tinnitus and ear discharge.   Eyes: Negative for blurred vision, double vision, photophobia, pain, discharge and redness.  Respiratory: Negative for cough, hemoptysis, sputum production, shortness of breath, wheezing and stridor.   Cardiovascular: Negative for chest pain, palpitations, orthopnea, claudication, leg swelling and PND.  Gastrointestinal: negative for abdominal pain. Negative for heartburn, nausea, vomiting, diarrhea, constipation, blood in stool and melena.  Genitourinary: Negative for dysuria, urgency, frequency, hematuria and flank pain.  Musculoskeletal: Negative for myalgias, back pain, joint pain and falls.  Skin: Negative for itching and rash.  Neurological: Negative for dizziness, tingling, tremors, sensory change, speech change, focal weakness, seizures, loss of consciousness, weakness and headaches.  Endo/Heme/Allergies: Negative for environmental allergies and polydipsia. Does not bruise/bleed easily.  Psychiatric/Behavioral: Negative for depression, suicidal ideas, hallucinations, memory loss and substance abuse. The patient is not nervous/anxious and does not have insomnia.        Objective:  Blood pressure 120/79, pulse 86, height 4' 11 (1.499 m), weight 167 lb (75.8 kg).   Physical Exam  Vitals reviewed. Constitutional: She is oriented to person, place, and time. She appears well-developed and well-nourished.  HENT:  Head: Normocephalic and atraumatic.        Right Ear: External ear normal.  Left Ear: External ear normal.  Nose: Nose normal.  Mouth/Throat: Oropharynx is clear and moist.  Eyes: Conjunctivae and EOM are normal. Pupils are equal, round, and reactive to light. Right eye exhibits no discharge. Left eye exhibits no discharge. No scleral icterus.  Neck: Normal range of motion. Neck supple. No tracheal deviation present. No thyromegaly present.  Cardiovascular: Normal rate,  regular rhythm, normal heart sounds and intact distal pulses.  Exam reveals no gallop and no friction rub.   No murmur heard. Respiratory: Effort normal and breath sounds normal. No respiratory distress. She has no wheezes. She has no rales. She exhibits no tenderness.  GI: Soft. Bowel sounds are normal. She exhibits no distension and no mass. There is no tenderness. There is no rebound and no guarding.  Genitourinary:  Breasts no masses skin changes or nipple changes bilaterally      Vulva is normal without lesions Vagina is pink moist without discharge Cervix absent Uterus is absent Adnexa is negative absent ovaries and tubes  Musculoskeletal: Normal range of motion.  She exhibits no edema and no tenderness.  Neurological: She is alert and oriented to person, place, and time. She has normal reflexes. She displays normal reflexes. No cranial nerve deficit. She exhibits normal muscle tone. Coordination normal.  Skin: Skin is warm and dry. No rash noted. No erythema. No pallor.  Psychiatric: She has a normal mood and affect. Her behavior is normal. Judgment and thought content normal.       Medications Ordered at today's visit: Meds ordered this encounter  Medications   estrogens , conjugated, (PREMARIN ) 1.25 MG tablet    Sig: Take 1 tablet (1.25 mg total) by mouth daily.    Dispense:  30 tablet    Refill:  11    NA   conjugated estrogens  (PREMARIN ) vaginal cream    Sig: Place 1 Applicatorful vaginally at bedtime.    Dispense:  30 g    Refill:  11    Other orders placed at today's visit: No orders of the defined types were placed in this encounter.    ASSESSMENT + PLAN:    ICD-10-CM   1. Well woman exam with routine gynecological exam  Z01.419     2. Vaginal atrophy: Start Premarin  vaginal cream  N95.2     3. Vasomotor symptoms due to menopause: Continue Premarin  1.25 and add Premarin  vaginal cream  N95.1           Return in about 3 years (around 12/08/2026), or if  symptoms worsen or fail to improve, for yearly.

## 2023-12-22 ENCOUNTER — Encounter: Payer: Self-pay | Admitting: Radiology

## 2023-12-23 ENCOUNTER — Encounter: Payer: Self-pay | Admitting: *Deleted

## 2023-12-24 ENCOUNTER — Encounter: Payer: Self-pay | Admitting: Nurse Practitioner

## 2023-12-25 ENCOUNTER — Ambulatory Visit: Admitting: Nurse Practitioner

## 2024-01-13 ENCOUNTER — Ambulatory Visit: Payer: Self-pay | Admitting: Nurse Practitioner

## 2024-01-13 DIAGNOSIS — E785 Hyperlipidemia, unspecified: Secondary | ICD-10-CM

## 2024-01-13 MED ORDER — FENOFIBRATE 48 MG PO TABS: 48.0000 mg | ORAL_TABLET | Freq: Every day | ORAL | 3 refills | Status: AC

## 2024-01-13 NOTE — Telephone Encounter (Signed)
 The patient has been notified of the result and verbalized understanding.  All questions (if any) were answered. Rosina JAYSON Cornea, CMA 01/13/2024 10:25 AM

## 2024-01-13 NOTE — Telephone Encounter (Signed)
-----   Message from Almarie Crate sent at 01/13/2024 10:13 AM EST ----- Labs overall look good. Her TG continues to be elevated. Please begin Fenofibrate  48 mg daily and repeat FLP and LFT in 2-3 months.   Thanks!  Almarie Crate, AGNP-C ----- Message ----- From: Delbra Krebs T Sent: 12/24/2023   9:22 AM EST To: Almarie Crate, NP

## 2024-02-27 ENCOUNTER — Ambulatory Visit: Attending: Nurse Practitioner | Admitting: Nurse Practitioner

## 2024-02-27 ENCOUNTER — Encounter: Payer: Self-pay | Admitting: Nurse Practitioner

## 2024-02-27 VITALS — BP 132/82 | HR 72 | Ht 59.0 in | Wt 171.2 lb

## 2024-02-27 DIAGNOSIS — E785 Hyperlipidemia, unspecified: Secondary | ICD-10-CM | POA: Insufficient documentation

## 2024-02-27 DIAGNOSIS — I2510 Atherosclerotic heart disease of native coronary artery without angina pectoris: Secondary | ICD-10-CM | POA: Diagnosis present

## 2024-02-27 DIAGNOSIS — I350 Nonrheumatic aortic (valve) stenosis: Secondary | ICD-10-CM | POA: Diagnosis present

## 2024-02-27 DIAGNOSIS — Q2381 Bicuspid aortic valve: Secondary | ICD-10-CM | POA: Diagnosis present

## 2024-02-27 DIAGNOSIS — H538 Other visual disturbances: Secondary | ICD-10-CM | POA: Insufficient documentation

## 2024-02-27 DIAGNOSIS — E669 Obesity, unspecified: Secondary | ICD-10-CM | POA: Diagnosis present

## 2024-02-27 DIAGNOSIS — R519 Headache, unspecified: Secondary | ICD-10-CM | POA: Diagnosis present

## 2024-02-27 DIAGNOSIS — R42 Dizziness and giddiness: Secondary | ICD-10-CM | POA: Diagnosis present

## 2024-02-27 DIAGNOSIS — I10 Essential (primary) hypertension: Secondary | ICD-10-CM | POA: Insufficient documentation

## 2024-02-27 DIAGNOSIS — R079 Chest pain, unspecified: Secondary | ICD-10-CM | POA: Insufficient documentation

## 2024-02-27 MED ORDER — LOSARTAN POTASSIUM 50 MG PO TABS: ORAL_TABLET | ORAL | 6 refills | Status: AC

## 2024-02-27 NOTE — Patient Instructions (Signed)
 Medication Instructions:   Increase Losartan  to 50mg  every morning & 25mg  every evening  Continue all other medications.     Labwork:  Please do labs already requested in March   Testing/Procedures:  Your physician has requested that you have a carotid duplex. This test is an ultrasound of the carotid arteries in your neck. It looks at blood flow through these arteries that supply the brain with blood. Allow one hour for this exam. There are no restrictions or special instructions. Your physician has requested that you have an echocardiogram. Echocardiography is a painless test that uses sound waves to create images of your heart. It provides your doctor with information about the size and shape of your heart and how well your hearts chambers and valves are working. This procedure takes approximately one hour. There are no restrictions for this procedure. Please do NOT wear cologne, perfume, aftershave, or lotions (deodorant is allowed). Please arrive 15 minutes prior to your appointment time.  Please note: We ask at that you not bring children with you during ultrasound (echo/ vascular) testing. Due to room size and safety concerns, children are not allowed in the ultrasound rooms during exams. Our front office staff cannot provide observation of children in our lobby area while testing is being conducted. An adult accompanying a patient to their appointment will only be allowed in the ultrasound room at the discretion of the ultrasound technician under special circumstances. We apologize for any inconvenience. DO NEXT MONTH  Follow-Up:  Office will contact with results via phone, letter or mychart.    6-8 weeks   Any Other Special Instructions Will Be Listed Below (If Applicable).  BP log x 2 weeks  Salty six   If you need a refill on your cardiac medications before your next appointment, please call your pharmacy.

## 2024-02-27 NOTE — Progress Notes (Unsigned)
 " Cardiology Office Note:    Date: 02/27/2024 ID:  Kara Matthews, DOB 1975-06-13, MRN [id number] PCP:  Catharine Ethelene JONETTA., PA-C [facility] Providers Cardiologist:  Jayson Sierras, MD    Referring MD: Catharine Ethelene JONETTA., PA-C   CC: Here for overdue 6 month follow-up  History of Present Illness:    Kara Matthews is a 49 y.o. female with a PMH of bicuspid aortic valve resulting in aortic valve stenosis, minimal CAD, palpitations, hypertension, hyperlipidemia, fibromyalgia, who presents today for scheduled follow-up.   Previous cardiovascular history includes Myoview  that was low risk, no definite ischemia. 72-hour ZIO in 2020 and was overall in normal sinus rhythm, brief episodes of SVT thought was contributing to palpitations.    CCTA 02/2022 revealed coronary calcium  score 5, nonobstructive mild CAD involving mid LAD, (0-24%) stenosis, bicuspid aortic valve with fusion of left and right cusp noted.    TTE 04/2022 revealed normal EF, moderate aortic valve stenosis with mean gradient 21.2 mmHg.   Last seen by Dr. Sierras on November 13, 2022.  Patient noted atypical, sharp sudden chest pain that was sporadic. Not felt to be cardiac related.  See updated echocardiogram noted below from March 2025.  05/06/2023 - Today she presents for scheduled follow-up.  She admits to episode of chest pain yesterday GERD on the left side, sometimes noted to be on the right side.  Admits to blood pressure issues recently that have resolved, says she is not sure where this has been due to.  Does admit to some dizziness that is bothersome for her, has been noted when after using the restroom.  Had an episode last week where she was sick afterwards, was dizzy, felt like the room was spinning, says she had a call out of work.  Does admit to sensation of left eye feeling like it is on fire denies any headache, says it will last on her left side for about 10 minutes, then go away, says she cannot seem to  move during these episodes.  Denies any strokelike symptoms. Denies any shortness of breath, palpitations, syncope, presyncope, orthopnea, PND, swelling or significant weight changes, acute bleeding, or claudication.  06/24/2023 - Here for follow-up. Doing much better since I last saw her. Symptoms have resolved.  Says that she is no longer taking rosuvastatin  as she thought this medicine made her sick.  Also confirms to me she is not taking aspirin  81 mg daily. Attributes her past symptoms to stress. Denies any chest pain, shortness of breath, palpitations, syncope, presyncope, dizziness, orthopnea, PND, swelling or significant weight changes, acute bleeding, or claudication.  02/27/2024 - Today she is here and admits to intermittent, mild chest pain at times where she has to take a break, not bothersome overall per her report.  Difficult to describe.  Does admit to some issues with her blood pressure.  Admits to issues of headache and can tell her blood pressure is spiking more in the evening.  Also admits to some episodes of dizziness where also left eye can become painful and admits to blurry vision on this side, does follow an ophthalmologist. Denies any shortness of breath, palpitations, syncope, presyncope, orthopnea, PND, swelling or significant weight changes, acute bleeding, or claudication.  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    ROS:   Please see the history of present illness.    All other systems reviewed and are negative.  EKGs/Labs/Other Studies Reviewed:    The following  studies were reviewed today:   EKG:   EKG is not ordered today.  Cardiac monitor 05/2023:  ZIO monitor reviewed.  12 days, 9 hours analyzed.   Predominant rhythm is sinus with heart rate ranging from 47 bpm up to 120 bpm and average heart rate 65 bpm. There were rare PACs including atrial couplets and triplets representing less than 1% total beats. There were rare PVCs  representing less than 1% total beats. Eight brief episodes of PSVT were noted, the longest of which lasted 12 beats.  No sustained arrhythmias. No pauses or high degree heart block.  Carotid duplex 05/2023:  Summary:  Right Carotid: Velocities in the right ICA are consistent with a 1-39%  stenosis. Non-hemodynamically significant plaque <50% noted in the  CCA. The ECA appears <50% stenosed.   Left Carotid: Velocities in the left ICA are consistent with a 1-39%  stenosis. Non-hemodynamically significant plaque <50% noted in the  CCA. The ECA appears <50% stenosed.   Vertebrals:  Bilateral vertebral arteries demonstrate antegrade flow.  Subclavians: Normal flow hemodynamics were seen in bilateral subclavian arteries.   *See table(s) above for measurements and observations.  Suggest follow up study in 12 months.  Echo 04/2023:  1. Left ventricular ejection fraction, by estimation, is 60 to 65%. Left  ventricular ejection fraction by 3D volume is 61 %. The left ventricle has  normal function. The left ventricle has no regional wall motion  abnormalities. Left ventricular diastolic   parameters were normal. The average left ventricular global longitudinal  strain is -17.1 %. The global longitudinal strain is normal.   2. Right ventricular systolic function is normal. The right ventricular  size is normal. There is normal pulmonary artery systolic pressure.   3. The mitral valve is normal in structure. Mild mitral valve  regurgitation. No evidence of mitral stenosis.   4. The aortic valve is bicuspid. Aortic valve regurgitation is not  visualized. Moderate aortic valve stenosis. Aortic valve area, by VTI  measures 1.08 cm. Aortic valve mean gradient measures 18.7 mmHg. Aortic  valve Vmax measures 3.07 m/s.   5. The inferior vena cava is normal in size with greater than 50%  respiratory variability, suggesting right atrial pressure of 3 mmHg.   Comparison(s): A prior study was  performed on 04/22/2022. No significant  change from prior study.  CCTA on 03/06/2022: IMPRESSION: 1. Coronary calcium  score of 5. This was 92nd percentile for age and sex matched control.   2. Normal coronary origin with left dominance.   3. Nonobstructive CAD with calcified plaque in mid LAD causing minimal (0-24%) stenosis   4. Bicuspid aortic valve with fusion of left and right cusps   CAD-RADS 1. Minimal non-obstructive CAD (0-24%). Consider non-atherosclerotic causes of chest pain. Consider preventive therapy and risk factor modification.  2D complete echocardiogram on May 01, 2021:  1. Left ventricular ejection fraction, by estimation, is 60 to 65%. The  left ventricle has normal function. The left ventricle has no regional  wall motion abnormalities. There is moderate left ventricular hypertrophy.  Left ventricular diastolic  parameters were normal. The average left ventricular global longitudinal  strain is -22.0 %. The global longitudinal strain is normal.   2. Right ventricular systolic function is normal. The right ventricular  size is normal. Tricuspid regurgitation signal is inadequate for assessing  PA pressure.   3. The mitral valve is normal in structure. Mild mitral valve  regurgitation. No evidence of mitral stenosis.   4. The aortic valve  has an indeterminant number of cusps. Aortic valve  regurgitation is not visualized. Mild to moderate aortic valve stenosis.  Aortic valve mean gradient measures 18.0 mmHg. Aortic valve peak gradient  measures 32.1 mmHg. Aortic valve  area, by VTI measures 1.40 cm.   72-hour ZIO monitor on November 11, 2018: Predominant rhythm is sinus with heart rate ranging from 43 bpm up to 93 bpm and average heart rate 61 bpm. Brief episodes of SVT were noted, the longest lasting approximately 12 seconds and with heart rate ranging from 107-135 during these brief events. There were only rare PACs and PVCs otherwise. No sustained  arrhythmias or pauses.  Myoview  on August 29, 2017: There was no ST segment deviation noted during stress. The study is normal. Large moderate intensity fixed anterior defect with normal wall motion, consistent with breast attenuation though the defect is quite large. Based on body habitus (4'11, 181 lbs, BMI 37) this could explain the size of defect. There is clearly normal anterior wall motion and no reversibility. This is a low risk study. The left ventricular ejection fraction is hyperdynamic (>65%).  Physical Exam:    VS:  BP 132/82   Pulse 72   Ht 4' 11 (1.499 m)   Wt 171 lb 3.2 oz (77.7 kg)   SpO2 97%   BMI 34.58 kg/m     Wt Readings from Last 3 Encounters:  02/27/24 171 lb 3.2 oz (77.7 kg)  12/08/23 167 lb (75.8 kg)  06/24/23 173 lb (78.5 kg)   GEN: Obese, 49 year old female in no acute distress HEENT: Normal NECK: No JVD; No carotid bruits CARDIAC: S1/S2, RRR, Grade 2/6 murmur, no rubs, no gallops; 2+ peripheral pulses throughout RESPIRATORY:  Clear and diminished to auscultation without rales or rhonchi or wheezing MUSCULOSKELETAL:  No edema; No deformity  SKIN: Warm and dry NEUROLOGIC:  Alert and oriented x 3 PSYCHIATRIC:  Normal affect   ASSESSMENT & PLAN:    In order of problems listed above:  CAD, chest pain of uncertain etiology Admits to intermittent, atypical chest pain at times.  Previous coronary CTA in November 2024 showed coronary calcium  score of 5 with nonobstructive CAD noted.  Presence of bicuspid aortic valve noted.  Continue current medication regimen.  I confirm with her and she is not taking rosuvastatin .  Will recheck a fasting lipid profile and LFT in March 2026.  If not at goal, plan to restart rosuvastatin .  I also instructed her to increase losartan  to 50 mg in the morning and 25 mg in the evening.  Will update echocardiogram at this time.  No other medication changes at this time.  Heart healthy diet and regular cardiovascular exercise  encouraged.  Care and ED precautions discussed.  2. Bicuspid aortic valve, aortic valve stenosis Difficult to say if she is having symptoms from this and we will update echo at this time. Will continue to monitor.  ED precautions discussed.  Continue current medication regimen. Heart healthy diet and regular cardiovascular exercise encouraged.   3. HTN BP on borderline elevated today and admits to some elevated readings at home in the evening.  Instructed her to take 50 mg of losartan  in the morning and 25 mg in the evening.  No other medication changes at this time.  Discussed to monitor BP at home at least 2 hours after medications and sitting for 5-10 minutes.   Heart healthy diet and regular cardiovascular exercise encouraged.  She will update us  in 2 to 3 weeks with her  BP readings.  4. HLD Admits that she has not been taking rosuvastatin  recently due to some medication concerns.  Will recheck fasting lipid profile and LFT  in the future (March 2026) as she is consistently taking Zetia and fenofibrate .  No medication changes at this time. Heart healthy diet and regular cardiovascular exercise encouraged.   5. Obesity  Weight loss via diet and exercise encouraged. Discussed the impact being overweight would have on cardiovascular risk. Heart healthy diet and regular cardiovascular exercise encouraged.   6.  Dizziness, blurry vision, headache Etiology unclear.  Will arrange carotid duplex in addition to echo as noted above.  No medication changes at this time besides as noted above. Care and ED precautions discussed. Continue to follow with PCP.   Disposition: Care and ED precautions discussed.  Follow-up with me or Dr. Debera or APP in 6-8 weeks or sooner if anything changes.     If you need a refill on your cardiac medications before your next appointment, please call your pharmacy.  Signed, Almarie Crate, NP  "

## 2024-03-03 ENCOUNTER — Telehealth: Payer: Self-pay | Admitting: Nurse Practitioner

## 2024-03-03 NOTE — Telephone Encounter (Signed)
 Patient came into the office today. Stated that her BP was elevated and she didn't feel good today at work. She took her BP was 171/87 when she left work she got home and took her BP 164/94 HR was 74. She got up this morning took the 50 mg tablet. Later she noticed she wasn't feeling good and her head was hurting then that's when she had took her BP and took another 1/2 tablet. After coming by the office this afternoon she took another 1/2 tablet. Reported no Chest Pain, lightheadedness or dizziness. She stated that she can feel its coming down. Advised her that we needed trends of her BP as discussed in her last office visit on 01/09. She stated that she had only wrote down a the one given. Advised on the heart healthy diet and she needed to update us  in a couple weeks. Stated that she knew that her medication would probably be increased as well.  I advised her that will send over to provider for review.

## 2024-03-03 NOTE — Telephone Encounter (Signed)
 Blood Pressure 177/85   Pt wants to know if she can go ahead and take another half of her BP med.   Pt was on 1 and recently increased to 1 1/2.

## 2024-03-04 NOTE — Telephone Encounter (Signed)
 Called patient to let her know of Elizabeth's response: Advised her that will mail out her a lab order that she can go to [facility] in [place]. She also stated that she called this morning, bit didn't see record of it. She reports she is still not feeling well. Headache isn't as bad, but her BP is still elevated and would like to see if Almarie wants to increase to 2 full tablets, instead of 1/2 in the evening. Gave BP readings  178/100 HR 77 last night 168/74 HR 74 at 7:10 she took her 50 mg dose at this time waited 2 more hours 169/104 HR 73 at 9:00 162/93 HR 70 at 11:20 I advised her that if she has any worsening symptoms will need to go to ER as previously mentioned by Almarie stated that she doesn't want to go because the flu is bad. I advised her that I will send to provider about medication increase and will call back. She also mentioned that she will call her PCP in the mean time

## 2024-04-06 ENCOUNTER — Ambulatory Visit

## 2024-04-16 ENCOUNTER — Ambulatory Visit: Admitting: Nurse Practitioner
